# Patient Record
Sex: Male | Born: 1935 | Race: White | Hispanic: No | Marital: Married | Smoking: Former smoker
Health system: Southern US, Community
[De-identification: ages and names within clinical notes are randomized; demographics above are authoritative.]

## PROBLEM LIST (undated history)

## (undated) DIAGNOSIS — N184 Chronic kidney disease, stage 4 (severe): Secondary | ICD-10-CM

## (undated) DIAGNOSIS — R2681 Unsteadiness on feet: Secondary | ICD-10-CM

## (undated) DIAGNOSIS — N138 Other obstructive and reflux uropathy: Secondary | ICD-10-CM

## (undated) DIAGNOSIS — M19031 Primary osteoarthritis, right wrist: Secondary | ICD-10-CM

## (undated) DIAGNOSIS — I10 Essential (primary) hypertension: Secondary | ICD-10-CM

## (undated) DIAGNOSIS — N281 Cyst of kidney, acquired: Secondary | ICD-10-CM

## (undated) DIAGNOSIS — M545 Low back pain, unspecified: Secondary | ICD-10-CM

## (undated) DIAGNOSIS — G8929 Other chronic pain: Secondary | ICD-10-CM

## (undated) DIAGNOSIS — E119 Type 2 diabetes mellitus without complications: Secondary | ICD-10-CM

## (undated) DIAGNOSIS — Z862 Personal history of diseases of the blood and blood-forming organs and certain disorders involving the immune mechanism: Secondary | ICD-10-CM

## (undated) DIAGNOSIS — N319 Neuromuscular dysfunction of bladder, unspecified: Secondary | ICD-10-CM

## (undated) DIAGNOSIS — N200 Calculus of kidney: Secondary | ICD-10-CM

## (undated) DIAGNOSIS — Z8619 Personal history of other infectious and parasitic diseases: Secondary | ICD-10-CM

## (undated) DIAGNOSIS — N189 Chronic kidney disease, unspecified: Secondary | ICD-10-CM

## (undated) DIAGNOSIS — K5790 Diverticulosis of intestine, part unspecified, without perforation or abscess without bleeding: Secondary | ICD-10-CM

## (undated) DIAGNOSIS — Z87448 Personal history of other diseases of urinary system: Secondary | ICD-10-CM

## (undated) DIAGNOSIS — N401 Enlarged prostate with lower urinary tract symptoms: Secondary | ICD-10-CM

## (undated) DIAGNOSIS — N183 Chronic kidney disease, stage 3 (moderate): Secondary | ICD-10-CM

## (undated) DIAGNOSIS — N390 Urinary tract infection, site not specified: Secondary | ICD-10-CM

## (undated) DIAGNOSIS — I714 Abdominal aortic aneurysm, without rupture, unspecified: Secondary | ICD-10-CM

## (undated) DIAGNOSIS — R5381 Other malaise: Secondary | ICD-10-CM

## (undated) DIAGNOSIS — N3941 Urge incontinence: Secondary | ICD-10-CM

## (undated) DIAGNOSIS — J449 Chronic obstructive pulmonary disease, unspecified: Secondary | ICD-10-CM

## (undated) DIAGNOSIS — R972 Elevated prostate specific antigen [PSA]: Secondary | ICD-10-CM

## (undated) HISTORY — DX: Chronic kidney disease, stage 3 (moderate): N18.3

## (undated) HISTORY — DX: Low back pain: M54.5

## (undated) HISTORY — DX: Personal history of other infectious and parasitic diseases: Z86.19

## (undated) HISTORY — DX: Cyst of kidney, acquired: N28.1

## (undated) HISTORY — DX: Unsteadiness on feet: R26.81

## (undated) HISTORY — DX: Other obstructive and reflux uropathy: N13.8

## (undated) HISTORY — DX: Diverticulosis of intestine, part unspecified, without perforation or abscess without bleeding: K57.90

## (undated) HISTORY — DX: Chronic kidney disease, unspecified: N18.9

## (undated) HISTORY — DX: Personal history of diseases of the blood and blood-forming organs and certain disorders involving the immune mechanism: Z86.2

## (undated) HISTORY — DX: Chronic obstructive pulmonary disease, unspecified: J44.9

## (undated) HISTORY — DX: Benign prostatic hyperplasia with lower urinary tract symptoms: N40.1

## (undated) HISTORY — DX: Essential (primary) hypertension: I10

## (undated) HISTORY — DX: Calculus of kidney: N20.0

## (undated) HISTORY — DX: Abdominal aortic aneurysm, without rupture, unspecified: I71.40

## (undated) HISTORY — DX: Other malaise: R53.81

## (undated) HISTORY — DX: Elevated prostate specific antigen (PSA): R97.20

## (undated) HISTORY — DX: Personal history of other diseases of urinary system: Z87.448

## (undated) HISTORY — DX: Low back pain, unspecified: M54.50

## (undated) HISTORY — DX: Neuromuscular dysfunction of bladder, unspecified: N31.9

## (undated) HISTORY — DX: Type 2 diabetes mellitus without complications: E11.9

## (undated) HISTORY — DX: Primary osteoarthritis, right wrist: M19.031

## (undated) HISTORY — DX: Urge incontinence: N39.41

## (undated) HISTORY — DX: Other chronic pain: G89.29

## (undated) HISTORY — DX: Chronic kidney disease, stage 4 (severe): N18.4

## (undated) HISTORY — DX: Urinary tract infection, site not specified: N39.0

## (undated) HISTORY — PX: CATARACT EXTRACTION, BILATERAL: SHX1313

---
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
## 2008-06-30 HISTORY — PX: VENTRAL HERNIA REPAIR: SHX424

## 2014-05-09 DIAGNOSIS — Z72820 Sleep deprivation: Secondary | ICD-10-CM | POA: Diagnosis not present

## 2014-05-09 DIAGNOSIS — I10 Essential (primary) hypertension: Secondary | ICD-10-CM | POA: Insufficient documentation

## 2014-05-09 DIAGNOSIS — M545 Low back pain: Secondary | ICD-10-CM | POA: Diagnosis not present

## 2014-05-09 DIAGNOSIS — Z23 Encounter for immunization: Secondary | ICD-10-CM | POA: Diagnosis not present

## 2014-05-09 DIAGNOSIS — G8929 Other chronic pain: Secondary | ICD-10-CM | POA: Diagnosis not present

## 2014-05-09 DIAGNOSIS — R7309 Other abnormal glucose: Secondary | ICD-10-CM | POA: Diagnosis not present

## 2014-05-09 DIAGNOSIS — Z79899 Other long term (current) drug therapy: Secondary | ICD-10-CM | POA: Insufficient documentation

## 2014-05-09 DIAGNOSIS — R5383 Other fatigue: Secondary | ICD-10-CM | POA: Diagnosis not present

## 2014-05-19 DIAGNOSIS — M25531 Pain in right wrist: Secondary | ICD-10-CM | POA: Diagnosis not present

## 2014-05-19 DIAGNOSIS — S62001P Unspecified fracture of navicular [scaphoid] bone of right wrist, subsequent encounter for fracture with malunion: Secondary | ICD-10-CM | POA: Diagnosis not present

## 2014-06-06 DIAGNOSIS — R7989 Other specified abnormal findings of blood chemistry: Secondary | ICD-10-CM | POA: Diagnosis not present

## 2014-06-06 DIAGNOSIS — R739 Hyperglycemia, unspecified: Secondary | ICD-10-CM | POA: Diagnosis not present

## 2014-06-06 DIAGNOSIS — R809 Proteinuria, unspecified: Secondary | ICD-10-CM | POA: Diagnosis not present

## 2014-06-06 DIAGNOSIS — I10 Essential (primary) hypertension: Secondary | ICD-10-CM | POA: Diagnosis not present

## 2014-06-06 DIAGNOSIS — Z23 Encounter for immunization: Secondary | ICD-10-CM | POA: Diagnosis not present

## 2014-06-20 DIAGNOSIS — R0683 Snoring: Secondary | ICD-10-CM | POA: Diagnosis not present

## 2014-06-20 DIAGNOSIS — R5383 Other fatigue: Secondary | ICD-10-CM | POA: Diagnosis not present

## 2014-06-20 DIAGNOSIS — G4710 Hypersomnia, unspecified: Secondary | ICD-10-CM | POA: Diagnosis not present

## 2014-07-09 DIAGNOSIS — S62021A Displaced fracture of middle third of navicular [scaphoid] bone of right wrist, initial encounter for closed fracture: Secondary | ICD-10-CM | POA: Insufficient documentation

## 2014-08-10 DIAGNOSIS — R7989 Other specified abnormal findings of blood chemistry: Secondary | ICD-10-CM | POA: Diagnosis not present

## 2014-08-16 DIAGNOSIS — N184 Chronic kidney disease, stage 4 (severe): Secondary | ICD-10-CM | POA: Insufficient documentation

## 2014-08-16 DIAGNOSIS — R7301 Impaired fasting glucose: Secondary | ICD-10-CM | POA: Insufficient documentation

## 2014-08-16 DIAGNOSIS — N183 Chronic kidney disease, stage 3 (moderate): Secondary | ICD-10-CM | POA: Diagnosis not present

## 2014-08-16 DIAGNOSIS — I10 Essential (primary) hypertension: Secondary | ICD-10-CM | POA: Diagnosis not present

## 2014-11-10 DIAGNOSIS — I10 Essential (primary) hypertension: Secondary | ICD-10-CM | POA: Diagnosis not present

## 2014-11-10 DIAGNOSIS — Z9119 Patient's noncompliance with other medical treatment and regimen: Secondary | ICD-10-CM | POA: Diagnosis not present

## 2014-11-10 DIAGNOSIS — N183 Chronic kidney disease, stage 3 (moderate): Secondary | ICD-10-CM | POA: Diagnosis not present

## 2014-11-30 ENCOUNTER — Ambulatory Visit (INDEPENDENT_AMBULATORY_CARE_PROVIDER_SITE_OTHER): Payer: Medicare Other | Admitting: Family Medicine

## 2014-11-30 ENCOUNTER — Encounter: Payer: Self-pay | Admitting: Family Medicine

## 2014-11-30 VITALS — BP 163/97 | HR 64 | Temp 97.7°F | Resp 18 | Ht 66.5 in | Wt 196.0 lb

## 2014-11-30 DIAGNOSIS — R809 Proteinuria, unspecified: Secondary | ICD-10-CM | POA: Diagnosis not present

## 2014-11-30 DIAGNOSIS — R7309 Other abnormal glucose: Secondary | ICD-10-CM

## 2014-11-30 DIAGNOSIS — N183 Chronic kidney disease, stage 3 unspecified: Secondary | ICD-10-CM

## 2014-11-30 DIAGNOSIS — I10 Essential (primary) hypertension: Secondary | ICD-10-CM

## 2014-11-30 DIAGNOSIS — R7303 Prediabetes: Secondary | ICD-10-CM

## 2014-11-30 LAB — MICROALBUMIN / CREATININE URINE RATIO
Creatinine,U: 120.8 mg/dL
MICROALB/CREAT RATIO: 2.6 mg/g (ref 0.0–30.0)
Microalb, Ur: 3.1 mg/dL — ABNORMAL HIGH (ref 0.0–1.9)

## 2014-11-30 MED ORDER — METOPROLOL SUCCINATE ER 50 MG PO TB24: ORAL_TABLET | ORAL | Status: DC

## 2014-11-30 NOTE — Progress Notes (Signed)
Pre visit review using our clinic review tool, if applicable. No additional management support is needed unless otherwise documented below in the visit note. 

## 2014-11-30 NOTE — Progress Notes (Signed)
Office Note 12/10/2014  CC:  Chief Complaint  Patient presents with  . Establish Care    Pt is fasting.  Marland Kitchen Hypertension   HPI:  Gary Morgan is a 81 y.o. White male who is here to establish care, discuss HTN. Patient's most recent primary MD: Dr. Salvadore Dom on [address] in [place], [place].  Prior to this he saw PCP in [place] before relocating to [facility] 08/2013. Old records were not reviewed prior to or during today's visit.  He apparently was dismissed from the [facility] practice for "noncompliance".  Bad experience there.  Reviewed bp from the last 2 wks: avg syst 135, avg diast 85. Historically bp persistently > 140 over 90.  Never consistently stage 2.  He reports a bit of white coat syndrome as well. Most recent labs from 2 weeks ago reviewed today: Cr stable at 1.8, lytes normal. Back in Nov 2015 his FLP was wnl and hepatic panel was normal. He did have mild microalbuminuria in 04/2014.  Pt and wife note that he does walk in a somewhat stooped over manner such that sometimes his feel fall behind the upper body and he almost falls.  He denies dizziness, tremor, HA's, vomiting, or memory/cognitive problems.   This is not a new problem: possibly occurring last couple years at least, not progressive.   Past Medical History  Diagnosis Date  . Arthritis of right wrist     s/p fracture of middle third of scaphoid bone sustained cleaning up from [facility] injection by Dr. Napoleon Form in [place] (ortho) made the pain go away.  . Pre-diabetes   . Hypertension   . Kidney stones     one episode  . Chronic renal insufficiency, stage III (moderate)     CrCl 30s (saw nephrologist in [place]). +microalbuminuria 04/2014 at PCP in [place]    Past Surgical History  Procedure Laterality Date  . Ventral hernia repair  2010    with strangulation of SB (approx 1 foot of SB had to be excised).  Had 3 total surgeries for this due to complications  .  Tonsillectomy  1940    Family History  Problem Relation Age of Onset  . Breast cancer Mother   . Diabetes Mother     History   Social History  . Marital Status: Married    Spouse Name: N/A  . Number of Children: N/A  . Years of Education: N/A   Occupational History  . Not on file.   Social History Main Topics  . Smoking status: Former Smoker -- 0.25 packs/day for 5 years    Types: Cigarettes    Quit date: 06/30/1978  . Smokeless tobacco: Never Used  . Alcohol Use: No  . Drug Use: No  . Sexual Activity: Not on file   Other Topics Concern  . Not on file   Social History Narrative   Married, has 2 step daughters and 3 biologic children.  Has some grandchildren.   Relocated from [place] to [facility] 08/2013.   Retired [employer].   No T/A/Ds.    Outpatient Encounter Prescriptions as of 11/30/2014  Medication Sig  . aspirin 81 MG tablet Take 81 mg by mouth daily.  Marland Kitchen doxazosin (CARDURA) 4 MG tablet Take 4 mg by mouth at bedtime.  . metoprolol succinate (TOPROL-XL) 50 MG 24 hr tablet 1 tab po qd  . Multiple Vitamin (MULTIVITAMIN) tablet Take 1 tablet by mouth daily.  . valsartan-hydrochlorothiazide (DIOVAN-HCT) 320-25 MG per tablet Take 1 tablet by mouth daily.  . [  DISCONTINUED] metoprolol succinate (TOPROL-XL) 25 MG 24 hr tablet Take 25 mg by mouth daily.   No facility-administered encounter medications on file as of 11/30/2014.    Not on File   ROS Review of Systems  Constitutional: Negative for fever and fatigue.  HENT: Negative for congestion and sore throat.   Eyes: Negative for visual disturbance.  Respiratory: Negative for cough.   Cardiovascular: Negative for chest pain.  Gastrointestinal: Negative for nausea and abdominal pain.  Genitourinary: Negative for dysuria.  Musculoskeletal: Negative for back pain and joint swelling.  Skin: Negative for rash.  Neurological: Negative for weakness and headaches.  Hematological: Negative for adenopathy.     PE; Blood pressure 163/97, pulse 64, temperature 97.7 F (36.5 C), temperature source Oral, resp. rate 18, height 5' 6.5" (1.689 m), weight 196 lb (88.905 kg), SpO2 97 %. Gen: Alert, well appearing.  Patient is oriented to person, place, time, and situation. [id number]: no injection, icteris, swelling, or exudate.  EOMI, PERRLA. Mouth: lips without lesion/swelling.  Oral mucosa pink and moist. Oropharynx without erythema, exudate, or swelling.  Neck - No masses or thyromegaly or limitation in range of motion CV: RRR, no m/r/g.   LUNGS: CTA bilat, nonlabored resps, good aeration in all lung fields. EXT: no clubbing, cyanosis, or edema.  No cogwheel rigidity, no resting tremor, no intention tremor.  Facies are not mask-like. He walks with slight suggestion of shuffling gait and does a couple of pivots when turning 180 deg to return to where he started.  Pertinent labs:  none  ASSESSMENT AND PLAN:   1) HTN, not ideal control, particularly when pt has hypertensive renal dz: will shoot for goal of 120s over 70s. Increase toprol XL to 50mg  po qd.  2) CRI, stage III: recheck urine microalb/cr today. Working on tighter bp control, avoid nsaids and dehydration.  3) Mildly abnormal gait; w/out any other neuro or musculoskeletal abnormality. Watchful waiting approach at this time.  An After Visit Summary was printed and given to the patient.  Return in about 4 weeks (around 12/28/2014) for f/u HTN (30 min visit).

## 2014-12-28 ENCOUNTER — Other Ambulatory Visit: Payer: Self-pay | Admitting: *Deleted

## 2014-12-28 ENCOUNTER — Encounter: Payer: Self-pay | Admitting: Family Medicine

## 2014-12-28 ENCOUNTER — Ambulatory Visit (INDEPENDENT_AMBULATORY_CARE_PROVIDER_SITE_OTHER): Payer: Medicare Other | Admitting: Family Medicine

## 2014-12-28 VITALS — BP 120/72 | HR 51 | Temp 97.7°F | Resp 12 | Wt 196.0 lb

## 2014-12-28 DIAGNOSIS — R001 Bradycardia, unspecified: Secondary | ICD-10-CM

## 2014-12-28 DIAGNOSIS — I10 Essential (primary) hypertension: Secondary | ICD-10-CM | POA: Diagnosis not present

## 2014-12-28 DIAGNOSIS — D72829 Elevated white blood cell count, unspecified: Secondary | ICD-10-CM

## 2014-12-28 DIAGNOSIS — Z13220 Encounter for screening for lipoid disorders: Secondary | ICD-10-CM

## 2014-12-28 DIAGNOSIS — D751 Secondary polycythemia: Secondary | ICD-10-CM

## 2014-12-28 DIAGNOSIS — N183 Chronic kidney disease, stage 3 unspecified: Secondary | ICD-10-CM

## 2014-12-28 DIAGNOSIS — R7309 Other abnormal glucose: Secondary | ICD-10-CM | POA: Diagnosis not present

## 2014-12-28 DIAGNOSIS — R7303 Prediabetes: Secondary | ICD-10-CM

## 2014-12-28 DIAGNOSIS — Z0000 Encounter for general adult medical examination without abnormal findings: Secondary | ICD-10-CM

## 2014-12-28 LAB — CBC
HCT: 47 % (ref 39.0–52.0)
Hemoglobin: 15.9 g/dL (ref 13.0–17.0)
MCHC: 33.8 g/dL (ref 30.0–36.0)
MCV: 92.7 fl (ref 78.0–100.0)
PLATELETS: 168 10*3/uL (ref 150.0–400.0)
RBC: 5.07 Mil/uL (ref 4.22–5.81)
RDW: 13.5 % (ref 11.5–15.5)
WBC: 8.3 10*3/uL (ref 4.0–10.5)

## 2014-12-28 LAB — HEMOGLOBIN A1C: Hgb A1c MFr Bld: 6.2 % (ref 4.6–6.5)

## 2014-12-28 LAB — PHOSPHORUS: Phosphorus: 3.3 mg/dL (ref 2.3–4.6)

## 2014-12-28 NOTE — Progress Notes (Signed)
OFFICE NOTE  12/28/2014  CC:  Chief Complaint  Patient presents with  . Follow-up    HTN      HPI: Patient is a 81 y.o. Caucasian male who is here for 1 mo f/u HTN, CRI stage 3, pre-diabetes. No home gluc monitoring.  Unknown last A1c (relatively new pt to me). HOme bp and HR monitoring since last visit when I increased his toprol: 120s/70s, HR 50s for the most part, but unclear whether pt has also been having some HR in 30s-40s upon awakening in mornings.  Feels fatigued when first gets up in morning, then has cup of coffee and rechecks HR---feels good throughout rest of day and night.  He does not want to see a nephrologist locally as he had in the past where he used to live, wants to leave this decision up to me.  Pertinent PMH:  Past medical, surgical, social, and family history reviewed and no changes are noted since last office visit.  MEDS:  Outpatient Prescriptions Prior to Visit  Medication Sig Dispense Refill  . aspirin 81 MG tablet Take 81 mg by mouth daily.    Marland Kitchen doxazosin (CARDURA) 4 MG tablet Take 4 mg by mouth at bedtime.    . metoprolol succinate (TOPROL-XL) 50 MG 24 hr tablet 1 tab po qd (Patient taking differently: Take 50 mg by mouth daily. 1 tab po qd) 30 tablet 1  . Multiple Vitamin (MULTIVITAMIN) tablet Take 1 tablet by mouth daily.    . valsartan-hydrochlorothiazide (DIOVAN-HCT) 320-25 MG per tablet Take 1 tablet by mouth daily.     No facility-administered medications prior to visit.    PE: Blood pressure 120/72, pulse 51, temperature 97.7 F (36.5 C), temperature source Oral, resp. rate 12, weight 196 lb (88.905 kg), SpO2 96 %. Gen: Alert, well appearing.  Patient is oriented to person, place, time, and situation. CV: Regular rhythm, brady to mid 50s, no m/r/g.   LUNGS: CTA bilat, nonlabored resps, good aeration in all lung fields. EXT: no clubbing, cyanosis, or edema.  No tremor.  No cogwheel rigidity.  LABS: none  IMPRESSION AND PLAN:  1)  HTN;The current medical regimen is effective;  continue present plan and medications. Call if persistent low HR in mornings.  2) CRI, stage 3: most recent BUN/Cr 10/2014 was stable. Check CBC no diff + intact PTH with ca and phos today.  3) Prediabetes: check HbA1c today.  An After Visit Summary was printed and given to the patient.  FOLLOW UP: 4 mo, recheck CMET at that time.

## 2014-12-29 LAB — PTH, INTACT AND CALCIUM
Calcium: 9.8 mg/dL (ref 8.4–10.5)
PTH: 46 pg/mL (ref 14–64)

## 2015-01-31 ENCOUNTER — Telehealth: Payer: Self-pay | Admitting: Family Medicine

## 2015-01-31 MED ORDER — METOPROLOL SUCCINATE ER 50 MG PO TB24: ORAL_TABLET | ORAL | Status: DC

## 2015-01-31 NOTE — Telephone Encounter (Signed)
RF request for metoprolol LOV: 12/28/14 Next ov:  04/25/15 Last written: 11/30/14 #30 w/ 1RF  Pts wife LMOM earlier today 01/31/15 at 9:18am requesting refill, she has been advised that we have received message.

## 2015-01-31 NOTE — Telephone Encounter (Signed)
Pt. Is requesting a refill on his Metoprolol 50 mg. Pharmacy number is [phone]

## 2015-02-26 ENCOUNTER — Encounter: Payer: Self-pay | Admitting: Family Medicine

## 2015-03-20 ENCOUNTER — Telehealth: Payer: Self-pay | Admitting: Family Medicine

## 2015-03-20 MED ORDER — DOXAZOSIN MESYLATE 4 MG PO TABS: 4.0000 mg | ORAL_TABLET | Freq: Every day | ORAL | Status: DC

## 2015-03-20 MED ORDER — VALSARTAN-HYDROCHLOROTHIAZIDE 320-25 MG PO TABS: 1.0000 | ORAL_TABLET | Freq: Every day | ORAL | Status: DC

## 2015-03-20 NOTE — Telephone Encounter (Signed)
Rx request Goxazosin Mefylate 4 MG, Valsartan-Hydrochlorothiazid 320-25 [place]. Patient needs 2 months worth. Patient cannot hear very well, any questions call his wife.

## 2015-03-20 NOTE — Telephone Encounter (Signed)
Pts wife LMOM on 03/19/15 at 8:52am.  LOV: 12/28/14 NOV: 04/25/15  RF request for doxazosin Last written: unknown  RF request for valsartan/hctz Last written: unknown  Pt advised Rx has been sent.

## 2015-04-25 ENCOUNTER — Ambulatory Visit (INDEPENDENT_AMBULATORY_CARE_PROVIDER_SITE_OTHER): Payer: Medicare Other | Admitting: Family Medicine

## 2015-04-25 ENCOUNTER — Encounter: Payer: Self-pay | Admitting: Family Medicine

## 2015-04-25 VITALS — BP 138/84 | HR 59 | Temp 97.6°F | Resp 16 | Ht 66.5 in | Wt 196.0 lb

## 2015-04-25 DIAGNOSIS — Z23 Encounter for immunization: Secondary | ICD-10-CM | POA: Diagnosis not present

## 2015-04-25 DIAGNOSIS — I10 Essential (primary) hypertension: Secondary | ICD-10-CM | POA: Diagnosis not present

## 2015-04-25 LAB — BASIC METABOLIC PANEL
BUN: 26 mg/dL — AB (ref 6–23)
CALCIUM: 9.5 mg/dL (ref 8.4–10.5)
CHLORIDE: 103 meq/L (ref 96–112)
CO2: 27 meq/L (ref 19–32)
CREATININE: 2.16 mg/dL — AB (ref 0.40–1.50)
GFR: 31.33 mL/min — ABNORMAL LOW (ref >60.00)
Glucose, Bld: 90 mg/dL (ref 70–99)
Potassium: 4.6 mEq/L (ref 3.5–5.1)
Sodium: 141 mEq/L (ref 135–145)

## 2015-04-25 MED ORDER — VALSARTAN-HYDROCHLOROTHIAZIDE 320-25 MG PO TABS: 1.0000 | ORAL_TABLET | Freq: Every day | ORAL | Status: DC

## 2015-04-25 MED ORDER — METOPROLOL SUCCINATE ER 50 MG PO TB24: ORAL_TABLET | ORAL | Status: DC

## 2015-04-25 MED ORDER — DOXAZOSIN MESYLATE 4 MG PO TABS: 4.0000 mg | ORAL_TABLET | Freq: Every day | ORAL | Status: DC

## 2015-04-25 NOTE — Progress Notes (Signed)
Pre visit review using our clinic review tool, if applicable. No additional management support is needed unless otherwise documented below in the visit note. 

## 2015-04-25 NOTE — Progress Notes (Signed)
OFFICE NOTE  04/25/2015  CC:  Chief Complaint  Patient presents with  . Follow-up    Pt is not fasting.      HPI: Patient is a 81 y.o. Caucasian male who is here for 4 mo f/u HTN, CRI stage 3, prediabetes Feeling well.  Compliant with all chronic meds. No dizziness, fatigue, CP, SOB, or vision c/o. More active lately--cleaned out garage yesterday.  Home bp monitoring shows 120s/70s, HR avg 50-60s.  No orthostatic dizziness.  He is trying to eat a diabetic diet. No home glucose checks.  Pertinent PMH:  Past medical, surgical, social, and family history reviewed and no changes are noted since last office visit.  MEDS:  Outpatient Prescriptions Prior to Visit  Medication Sig Dispense Refill  . aspirin 81 MG tablet Take 81 mg by mouth daily.    Marland Kitchen doxazosin (CARDURA) 4 MG tablet Take 1 tablet (4 mg total) by mouth at bedtime. 30 tablet 2  . metoprolol succinate (TOPROL-XL) 50 MG 24 hr tablet 1 tab po qd 30 tablet 3  . Multiple Vitamin (MULTIVITAMIN) tablet Take 1 tablet by mouth daily.    . valsartan-hydrochlorothiazide (DIOVAN-HCT) 320-25 MG per tablet Take 1 tablet by mouth daily. 30 tablet 2   No facility-administered medications prior to visit.    PE: Blood pressure 138/84, pulse 59, temperature 97.6 F (36.4 C), temperature source Oral, resp. rate 16, height 5' 6.5" (1.689 m), weight 196 lb (88.905 kg), SpO2 95 %. Gen: Alert, well appearing.  Patient is oriented to person, place, time, and situation. AFFECT: pleasant, lucid thought and speech. No further exam today.  LABS:  Lab Results  Component Value Date   HGBA1C 6.2 12/28/2014   Lab Results  Component Value Date   WBC 8.3 12/28/2014   HGB 15.9 12/28/2014   HCT 47.0 12/28/2014   MCV 92.7 12/28/2014   PLT 168.0 12/28/2014   Lab Results  Component Value Date   PTH 46 12/28/2014   CALCIUM 9.8 12/28/2014   PHOS 3.3 12/28/2014     IMPRESSION AND PLAN:  1) HTN; The current medical regimen is effective;   continue present plan and medications. BMET today.  2) CRI, stage 3: The current medical regimen is effective;  continue present plan and medications. BMET today.  3) Prediabetes; continue dietary adjustments, be as active as possible. We'll recheck HbA1c at next f/u in 4 mo.  4) Prev health care: flu vaccine given today.  An After Visit Summary was printed and given to the patient.  FOLLOW UP: 4 mo

## 2015-07-01 DIAGNOSIS — N390 Urinary tract infection, site not specified: Secondary | ICD-10-CM

## 2015-07-01 HISTORY — DX: Urinary tract infection, site not specified: N39.0

## 2015-07-03 ENCOUNTER — Other Ambulatory Visit: Payer: Self-pay | Admitting: *Deleted

## 2015-07-03 ENCOUNTER — Telehealth: Payer: Self-pay | Admitting: *Deleted

## 2015-07-03 NOTE — Telephone Encounter (Signed)
Tylenol 500 mg tabs, 1-2 tabs every 6 hours as needed for pain. Avoid all other otc meds for pain.

## 2015-07-03 NOTE — Telephone Encounter (Signed)
Pts wife LMOM on 07/03/15 at 10:48am stating that pt has been having some shoulder pain and wants to know what he can take otc for this. Please advise. Thanks.

## 2015-07-04 NOTE — Telephone Encounter (Signed)
Pts wife advised and voiced understanding.  

## 2015-08-22 ENCOUNTER — Encounter: Payer: Self-pay | Admitting: Family Medicine

## 2015-08-22 ENCOUNTER — Ambulatory Visit (INDEPENDENT_AMBULATORY_CARE_PROVIDER_SITE_OTHER): Payer: Medicare Other | Admitting: Family Medicine

## 2015-08-22 VITALS — BP 182/118 | HR 65 | Temp 97.3°F | Resp 16 | Ht 66.5 in | Wt 196.5 lb

## 2015-08-22 DIAGNOSIS — R0609 Other forms of dyspnea: Secondary | ICD-10-CM | POA: Diagnosis not present

## 2015-08-22 DIAGNOSIS — R0600 Dyspnea, unspecified: Secondary | ICD-10-CM

## 2015-08-22 DIAGNOSIS — I10 Essential (primary) hypertension: Secondary | ICD-10-CM | POA: Diagnosis not present

## 2015-08-22 DIAGNOSIS — N183 Chronic kidney disease, stage 3 unspecified: Secondary | ICD-10-CM

## 2015-08-22 DIAGNOSIS — R7303 Prediabetes: Secondary | ICD-10-CM

## 2015-08-22 LAB — BASIC METABOLIC PANEL
BUN: 30 mg/dL — AB (ref 6–23)
CHLORIDE: 101 meq/L (ref 96–112)
CO2: 33 meq/L — AB (ref 19–32)
CREATININE: 2.42 mg/dL — AB (ref 0.40–1.50)
Calcium: 9.8 mg/dL (ref 8.4–10.5)
GFR: 27.46 mL/min — ABNORMAL LOW (ref >60.00)
GLUCOSE: 110 mg/dL — AB (ref 70–99)
POTASSIUM: 4.6 meq/L (ref 3.5–5.1)
Sodium: 142 mEq/L (ref 135–145)

## 2015-08-22 LAB — HEMOGLOBIN A1C: Hgb A1c MFr Bld: 6.4 % (ref 4.6–6.5)

## 2015-08-22 NOTE — Progress Notes (Signed)
Pre visit review using our clinic review tool, if applicable. No additional management support is needed unless otherwise documented below in the visit note. 

## 2015-08-22 NOTE — Progress Notes (Addendum)
OFFICE VISIT  08/22/2015   CC:  Chief Complaint  Patient presents with  . Follow-up    Pt is not fasting.    HPI:    Patient is a 82 y.o. Caucasian male who presents for 4 mo f/u HTN, prediabetes, CRI stage III. Pt and wife say pt's b/p always tends to rise when he drives, like this morning. Home bp monitoring qod shows bp 120s/70s avg, with HR 60 avg.  Says he feels good. No polyuria or polydipsia.  No LE edema.    He walks for activity, has a shed in his yard and a garden he works in when weather is warm. Walking from car to restaurant makes him feel SOB.  Rests a few minutes to get back to normal.   No CP.   Says it has been this way "for a long while now".  Attributes it to age.   Past Medical History  Diagnosis Date  . Arthritis of right wrist     s/p fracture of middle third of scaphoid bone sustained cleaning up from [facility] injection by Dr. Napoleon Form in [place] (ortho) made the pain go away.  . Pre-diabetes     2015 HbA1c 6.2-6.3 %  . Hypertension   . Kidney stones     one episode  . Chronic renal insufficiency, stage III (moderate)     CrCl 30s (saw nephrologist in [place]). +microalbuminuria 04/2014 at PCP in [place]  . Chronic low back pain     Past Surgical History  Procedure Laterality Date  . Ventral hernia repair  2010    with strangulation of SB (approx 1 foot of SB had to be excised).  Had 3 total surgeries for this due to complications  . Tonsillectomy  1940    Outpatient Prescriptions Prior to Visit  Medication Sig Dispense Refill  . aspirin 81 MG tablet Take 81 mg by mouth daily.    Marland Kitchen doxazosin (CARDURA) 4 MG tablet Take 1 tablet (4 mg total) by mouth at bedtime. 30 tablet 12  . metoprolol succinate (TOPROL-XL) 50 MG 24 hr tablet 1 tab po qd 30 tablet 12  . Multiple Vitamin (MULTIVITAMIN) tablet Take 1 tablet by mouth daily.    . valsartan-hydrochlorothiazide (DIOVAN-HCT) 320-25 MG tablet Take 1 tablet by mouth daily.  30 tablet 12   No facility-administered medications prior to visit.    No Known Allergies  ROS As per HPI  PE: Blood pressure 182/118, pulse 65, temperature 97.3 F (36.3 C), temperature source Oral, resp. rate 16, height 5' 6.5" (1.689 m), weight 196 lb 8 oz (89.132 kg), SpO2 96 %. Gen: Alert, well appearing.  Patient is oriented to person, place, time, and situation. CV: RRR, no m/r/g.   LUNGS: CTA bilat, nonlabored resps, good aeration in all lung fields. EXT: no clubbing, cyanosis, or edema.    LABS:    Chemistry      Component Value Date/Time   NA 141 04/25/2015 1049   K 4.6 04/25/2015 1049   CL 103 04/25/2015 1049   CO2 27 04/25/2015 1049   BUN 26* 04/25/2015 1049   CREATININE 2.16* 04/25/2015 1049      Component Value Date/Time   CALCIUM 9.5 04/25/2015 1049     Lab Results  Component Value Date   HGBA1C 6.2 12/28/2014   IMPRESSION AND PLAN:  1) HTN; well controlled per home measurements. The current medical regimen is effective;  continue present plan and medications. Lytes/cr today.  2) Prediabetes: HbA1c today.  3) Dyspnea on exertion: chronic.   Check CXR and echo--ordered today.  4) CRI stage III: check BMET today.  An After Visit Summary was printed and given to the patient.  FOLLOW UP: Return in about 4 months (around 12/20/2015) for routine chronic illness f/u.   ADDENDUM 08/29/15:  Pt sent message via MyChart stating that he changed his mind about the echocardiogram and he canceled it.  He says he wants to give himself a chance to be more active and get in better shape before looking into any possible problem with his heart.---PM  Signed:  Crissie Sickles, MD           08/29/2015

## 2015-08-29 ENCOUNTER — Encounter: Payer: Self-pay | Admitting: Family Medicine

## 2015-08-29 NOTE — Telephone Encounter (Signed)
FYI

## 2015-08-30 ENCOUNTER — Encounter: Payer: Self-pay | Admitting: *Deleted

## 2015-09-04 ENCOUNTER — Other Ambulatory Visit (HOSPITAL_COMMUNITY): Payer: Medicare Other

## 2015-09-13 ENCOUNTER — Telehealth: Payer: Self-pay | Admitting: Family Medicine

## 2015-09-13 MED ORDER — VALSARTAN-HYDROCHLOROTHIAZIDE 320-25 MG PO TABS: 1.0000 | ORAL_TABLET | Freq: Every day | ORAL | Status: DC

## 2015-09-13 NOTE — Telephone Encounter (Signed)
RF request for valsartan/hctz LOV: 08/22/15 Next ov: 12/20/15 Last written: 04/25/15 #30 w/ 12RF  Rx sent for #90 w/ 3RF.

## 2015-09-13 NOTE — Telephone Encounter (Signed)
Tried calling pt NA and unable to leave a message.

## 2015-09-13 NOTE — Telephone Encounter (Signed)
Valsartan 90 day supply [employer]

## 2015-09-13 NOTE — Telephone Encounter (Signed)
Pts wife advised and voiced understanding, okay per DPR. 

## 2015-09-27 ENCOUNTER — Encounter: Payer: Self-pay | Admitting: Family Medicine

## 2015-09-27 ENCOUNTER — Ambulatory Visit (INDEPENDENT_AMBULATORY_CARE_PROVIDER_SITE_OTHER): Payer: Medicare Other

## 2015-09-27 DIAGNOSIS — J449 Chronic obstructive pulmonary disease, unspecified: Secondary | ICD-10-CM

## 2015-09-27 DIAGNOSIS — R0600 Dyspnea, unspecified: Secondary | ICD-10-CM

## 2015-09-27 DIAGNOSIS — R0609 Other forms of dyspnea: Principal | ICD-10-CM

## 2015-12-20 ENCOUNTER — Ambulatory Visit (INDEPENDENT_AMBULATORY_CARE_PROVIDER_SITE_OTHER): Payer: Medicare Other | Admitting: Family Medicine

## 2015-12-20 ENCOUNTER — Encounter: Payer: Self-pay | Admitting: Family Medicine

## 2015-12-20 VITALS — BP 142/98 | HR 47 | Temp 97.3°F | Resp 16 | Ht 66.5 in | Wt 193.8 lb

## 2015-12-20 DIAGNOSIS — R7303 Prediabetes: Secondary | ICD-10-CM

## 2015-12-20 DIAGNOSIS — N183 Chronic kidney disease, stage 3 unspecified: Secondary | ICD-10-CM

## 2015-12-20 DIAGNOSIS — I10 Essential (primary) hypertension: Secondary | ICD-10-CM | POA: Diagnosis not present

## 2015-12-20 LAB — COMPREHENSIVE METABOLIC PANEL
ALBUMIN: 4.3 g/dL (ref 3.5–5.2)
ALK PHOS: 68 U/L (ref 39–117)
ALT: 9 U/L (ref 0–53)
AST: 11 U/L (ref 0–37)
BUN: 29 mg/dL — AB (ref 6–23)
CALCIUM: 10 mg/dL (ref 8.4–10.5)
CO2: 36 mEq/L — ABNORMAL HIGH (ref 19–32)
CREATININE: 2.34 mg/dL — AB (ref 0.40–1.50)
Chloride: 100 mEq/L (ref 96–112)
GFR: 28.52 mL/min — ABNORMAL LOW (ref >60.00)
Glucose, Bld: 112 mg/dL — ABNORMAL HIGH (ref 70–99)
POTASSIUM: 4.5 meq/L (ref 3.5–5.1)
SODIUM: 140 meq/L (ref 135–145)
TOTAL PROTEIN: 6.8 g/dL (ref 6.0–8.3)
Total Bilirubin: 0.9 mg/dL (ref 0.2–1.2)

## 2015-12-20 LAB — HEMOGLOBIN A1C: Hgb A1c MFr Bld: 6.3 % (ref 4.6–6.5)

## 2015-12-20 NOTE — Progress Notes (Signed)
OFFICE VISIT  12/20/2015   CC:  Chief Complaint  Patient presents with  . Follow-up    Pt is not fasting.     HPI:    Patient is a 82 y.o. Caucasian male who presents accompanied by his wife for 4 mo f/u HTN, prediabetes, CRI stage III.  Feeling well, no acute complaints.  Has been drinking glucerna in place of breakfast. He has a garden that he does some work in, is walking more since weather warmer.  He weed-eats his property.  No CP or SOB.  Home bp monitoring: avg HR 60s, avg syst 130, avg diast 80.  Past Medical History  Diagnosis Date  . Arthritis of right wrist     s/p fracture of middle third of scaphoid bone sustained cleaning up from [facility] injection by Dr. Napoleon Form in [place] (ortho) made the pain go away.  . Pre-diabetes     2015 HbA1c 6.2-6.3 %  . Hypertension   . Kidney stones     one episode  . Chronic renal insufficiency, stage III (moderate)     CrCl 30s (saw nephrologist in [place]). +microalbuminuria 04/2014 at PCP in [place]  . Chronic low back pain   . COPD (chronic obstructive pulmonary disease) ([place])     on CXR 09/27/2015    Past Surgical History  Procedure Laterality Date  . Ventral hernia repair  2010    with strangulation of SB (approx 1 foot of SB had to be excised).  Had 3 total surgeries for this due to complications  . Tonsillectomy  1940    Outpatient Prescriptions Prior to Visit  Medication Sig Dispense Refill  . aspirin 81 MG tablet Take 81 mg by mouth daily.    Marland Kitchen doxazosin (CARDURA) 4 MG tablet Take 1 tablet (4 mg total) by mouth at bedtime. 30 tablet 12  . metoprolol succinate (TOPROL-XL) 50 MG 24 hr tablet 1 tab po qd 30 tablet 12  . Multiple Vitamin (MULTIVITAMIN) tablet Take 1 tablet by mouth daily.    . valsartan-hydrochlorothiazide (DIOVAN-HCT) 320-25 MG tablet Take 1 tablet by mouth daily. 90 tablet 3   No facility-administered medications prior to visit.    No Known Allergies  ROS As per  HPI  PE: Blood pressure 146/82, pulse 47, temperature 97.3 F (36.3 C), temperature source Oral, resp. rate 16, height 5' 6.5" (1.689 m), weight 193 lb 12 oz (87.884 kg), SpO2 96 %. Gen: Alert, well appearing.  Patient is oriented to person, place, time, and situation. CV: RRR with occ pause, rate 50-55.  No m/r/g Chest is clear, no wheezing or rales. Normal symmetric air entry throughout both lung fields. No chest wall deformities or tenderness. EXT: no clubbing, cyanosis, or edema.   LABS:   Lab Results  Component Value Date   WBC 8.3 12/28/2014   HGB 15.9 12/28/2014   HCT 47.0 12/28/2014   MCV 92.7 12/28/2014   PLT 168.0 12/28/2014   Lab Results  Component Value Date   CREATININE 2.42* 08/22/2015   BUN 30* 08/22/2015   NA 142 08/22/2015   K 4.6 08/22/2015   CL 101 08/22/2015   CO2 33* 08/22/2015   Lab Results  Component Value Date   HGBA1C 6.4 08/22/2015   IMPRESSION AND PLAN:  1) HTN: The current medical regimen is effective;  continue present plan and medications.  2) CRI stage III: BMET today.  3) Prediabetes: check A1c today.  If 6.5% or greater, will refer to nutritionist +/- start actos  15 mg qd.  An After Visit Summary was printed and given to the patient.  FOLLOW UP: Return in about 6 months (around 06/20/2016) for routine chronic illness f/u (30 min).  Signed:  Crissie Sickles, MD           12/20/2015

## 2015-12-20 NOTE — Progress Notes (Signed)
Pre visit review using our clinic review tool, if applicable. No additional management support is needed unless otherwise documented below in the visit note. 

## 2015-12-21 ENCOUNTER — Encounter: Payer: Self-pay | Admitting: Family Medicine

## 2015-12-21 NOTE — Telephone Encounter (Signed)
See MyChart message. Please advise. Thanks.  

## 2016-03-15 DIAGNOSIS — Z23 Encounter for immunization: Secondary | ICD-10-CM | POA: Diagnosis not present

## 2016-03-25 DIAGNOSIS — Z23 Encounter for immunization: Secondary | ICD-10-CM | POA: Diagnosis not present

## 2016-04-27 ENCOUNTER — Other Ambulatory Visit: Payer: Self-pay | Admitting: Family Medicine

## 2016-04-30 DIAGNOSIS — K5790 Diverticulosis of intestine, part unspecified, without perforation or abscess without bleeding: Secondary | ICD-10-CM

## 2016-04-30 DIAGNOSIS — Z8619 Personal history of other infectious and parasitic diseases: Secondary | ICD-10-CM

## 2016-04-30 DIAGNOSIS — Z87448 Personal history of other diseases of urinary system: Secondary | ICD-10-CM

## 2016-04-30 HISTORY — DX: Diverticulosis of intestine, part unspecified, without perforation or abscess without bleeding: K57.90

## 2016-04-30 HISTORY — DX: Personal history of other diseases of urinary system: Z87.448

## 2016-04-30 HISTORY — DX: Personal history of other infectious and parasitic diseases: Z86.19

## 2016-05-03 DIAGNOSIS — R509 Fever, unspecified: Secondary | ICD-10-CM | POA: Diagnosis not present

## 2016-05-03 DIAGNOSIS — I700 Atherosclerosis of aorta: Secondary | ICD-10-CM | POA: Diagnosis not present

## 2016-05-03 DIAGNOSIS — I10 Essential (primary) hypertension: Secondary | ICD-10-CM | POA: Diagnosis not present

## 2016-05-03 DIAGNOSIS — Z7982 Long term (current) use of aspirin: Secondary | ICD-10-CM | POA: Diagnosis not present

## 2016-05-03 DIAGNOSIS — R1010 Upper abdominal pain, unspecified: Secondary | ICD-10-CM | POA: Diagnosis not present

## 2016-05-03 DIAGNOSIS — N281 Cyst of kidney, acquired: Secondary | ICD-10-CM | POA: Diagnosis not present

## 2016-05-03 DIAGNOSIS — R4182 Altered mental status, unspecified: Secondary | ICD-10-CM | POA: Diagnosis not present

## 2016-05-03 DIAGNOSIS — A419 Sepsis, unspecified organism: Secondary | ICD-10-CM | POA: Diagnosis not present

## 2016-05-03 DIAGNOSIS — R7881 Bacteremia: Secondary | ICD-10-CM | POA: Diagnosis not present

## 2016-05-03 DIAGNOSIS — R9341 Abnormal radiologic findings on diagnostic imaging of renal pelvis, ureter, or bladder: Secondary | ICD-10-CM | POA: Diagnosis not present

## 2016-05-03 DIAGNOSIS — N183 Chronic kidney disease, stage 3 (moderate): Secondary | ICD-10-CM | POA: Diagnosis not present

## 2016-05-03 DIAGNOSIS — I129 Hypertensive chronic kidney disease with stage 1 through stage 4 chronic kidney disease, or unspecified chronic kidney disease: Secondary | ICD-10-CM | POA: Diagnosis not present

## 2016-05-03 DIAGNOSIS — R112 Nausea with vomiting, unspecified: Secondary | ICD-10-CM | POA: Diagnosis not present

## 2016-05-03 DIAGNOSIS — N390 Urinary tract infection, site not specified: Secondary | ICD-10-CM | POA: Diagnosis present

## 2016-05-03 DIAGNOSIS — L89899 Pressure ulcer of other site, unspecified stage: Secondary | ICD-10-CM | POA: Insufficient documentation

## 2016-05-03 DIAGNOSIS — Z79899 Other long term (current) drug therapy: Secondary | ICD-10-CM | POA: Diagnosis not present

## 2016-05-03 DIAGNOSIS — W19XXXA Unspecified fall, initial encounter: Secondary | ICD-10-CM | POA: Diagnosis not present

## 2016-05-03 DIAGNOSIS — Z87891 Personal history of nicotine dependence: Secondary | ICD-10-CM | POA: Diagnosis not present

## 2016-05-03 DIAGNOSIS — R531 Weakness: Secondary | ICD-10-CM | POA: Diagnosis not present

## 2016-05-03 DIAGNOSIS — E1122 Type 2 diabetes mellitus with diabetic chronic kidney disease: Secondary | ICD-10-CM | POA: Diagnosis not present

## 2016-05-03 DIAGNOSIS — N179 Acute kidney failure, unspecified: Secondary | ICD-10-CM | POA: Diagnosis not present

## 2016-05-03 DIAGNOSIS — R7301 Impaired fasting glucose: Secondary | ICD-10-CM | POA: Diagnosis not present

## 2016-05-03 DIAGNOSIS — B9620 Unspecified Escherichia coli [E. coli] as the cause of diseases classified elsewhere: Secondary | ICD-10-CM | POA: Diagnosis present

## 2016-05-05 ENCOUNTER — Telehealth: Payer: Self-pay | Admitting: Family Medicine

## 2016-05-05 NOTE — Telephone Encounter (Signed)
[facility] - Client [place] Patient Name: ILAY CAPSHAW Gender: Male DOB: [birth year] Age: 82 Y 1 M 22 D Return Phone Number: [phone] (Primary) Address: City/State/Zip: [place] [address] Client [facility] - Client Client Site [place] [facility] Physician Crissie Sickles - MD Contact Type Call Who Is Calling Patient / Member / Family / Caregiver Call Type Triage / Clinical Caller Name [name] Relationship To Patient Spouse Return Phone Number [phone] (Primary) Chief Complaint FAINTING or [place] Reason for Call Symptomatic / Request for Health Information Initial Comment caller is trying to find the nearest hospital. Caller's husbands legs collapsed his legs last night and vomited last night. It's hard for him to get out of the bed and can't drink anything. Translation No Nurse Assessment Nurse: Leilani Merl, RN, Heather Date/Time (Eastern Time): 05/03/2016 9:49:58 AM Confirm and document reason for call. If symptomatic, describe symptoms. You must click the next button to save text entered. ---caller is trying to find the nearest hospital. Caller's husbands legs collapsed his legs last night and vomited last night. It's hard for him to get out of the bed and can't drink anything. Has the patient traveled out of the country within the last 30 days? ---Not Applicable Does the patient have any new or worsening symptoms? ---Yes Will a triage be completed? ---Yes Related visit to physician within the last 2 weeks? ---No Does the PT have any chronic conditions? (i.e. diabetes, asthma, etc.) ---Unknown Is this a behavioral health or substance abuse call? ---No Guidelines Guideline Title Affirmed Question Affirmed Notes Nurse Date/Time (Eastern Time) Disp. Time Eilene Ghazi Time) Disposition Final User 05/03/2016 9:47:13 AM Send to Urgent Queue  Verner Chol 05/03/2016 9:55:20 AM Clinical Call Yes Standifer, RN, Heather PLEASE NOTE: All timestamps contained within this report are represented as [place] Standard Time. CONFIDENTIALTY NOTICE: This fax transmission is intended only for the addressee. It contains information that is legally privileged, confidential or otherwise protected from use or disclosure. If you are not the intended recipient, you are strictly prohibited from reviewing, disclosing, copying using or disseminating any of this information or taking any action in reliance on or regarding this information. If you have received this fax in error, please notify us immediately by telephone so that we can arrange for its return to [place]. Phone: [phone], Toll-Free: [phone], Fax: [phone] Page: 2 of 2 Call Id: [id number] Comments User: Ave Filter, RN Date/Time Eilene Ghazi Time): 05/03/2016 9:55:00 AM Call was disconnected and triager tried to call caller back and got voicemail, left message to call nurse back if needed. Caller states that she is going to call the ambulance to take her husband to the ED if he will let her.

## 2016-05-05 NOTE — Telephone Encounter (Signed)
LMOM for pt to CB to discuss how he is feeling now and if he went to ED and where.  Awaiting pt call.

## 2016-05-05 NOTE — Telephone Encounter (Signed)
Noted  

## 2016-05-06 ENCOUNTER — Telehealth: Payer: Self-pay | Admitting: Family Medicine

## 2016-05-06 NOTE — Telephone Encounter (Signed)
Noted  

## 2016-05-06 NOTE — Telephone Encounter (Signed)
Transition Care Management Follow-up Telephone Call ** Patient was seen at [facility].  We faxed record release for patient 05/06/16. **   Date discharged? 05/06/16   How have you been since you were released from the hospital? Patient just feels tired   Do you understand why you were in the hospital? yes   Do you understand the discharge instructions? yes   Where were you discharged to? Home   Items Reviewed:  Medications reviewed: yes, new Rx Cipro  Allergies reviewed: yes  Dietary changes reviewed: no  Referrals reviewed: no   Functional Questionnaire:   Activities of Daily Living (ADLs):   He states they are independent in the following: ambulation ( pt got Walker ) States they require assistance with the following: none   Any transportation issues/concerns?: no   Any patient concerns? No   Confirmed importance and date/time of follow-up visits scheduled yes  Provider Appointment booked with Dr Anitra Lauth 05/14/16 @ 1:30pm.   Confirmed with patient if condition begins to worsen call PCP or go to the ER.  Patient was given the office number and encouraged to call back with question or concerns.  : yes

## 2016-05-07 ENCOUNTER — Telehealth: Payer: Self-pay | Admitting: Family Medicine

## 2016-05-07 ENCOUNTER — Telehealth: Payer: Self-pay

## 2016-05-07 NOTE — Telephone Encounter (Signed)
Patient & wife notified and verbalized understanding.  ° °

## 2016-05-07 NOTE — Telephone Encounter (Signed)
See note attached to similar question from today.

## 2016-05-07 NOTE — Telephone Encounter (Signed)
Patient unable to move bowels. What can he take? Please call

## 2016-05-07 NOTE — Telephone Encounter (Signed)
Patients wife calling stating that Gary Morgan is having severe urethral burning all the time and worse when urinating. Catheter was removed removed yesterday morning.  Patient home form hospital yesterday.

## 2016-05-07 NOTE — Telephone Encounter (Signed)
Generic otc senakot S: 2 tabs every night. AND otc generic miralax powder, 1 capful twice daily until he has 2 good evacuations of his colon. He can then cut the miralax dosing back to 1 capful once a day.

## 2016-05-07 NOTE — Telephone Encounter (Signed)
Did he get sent home on any new medication (such as antibiotic)? What was the primary diagnosis when he was in the hospital? Did a home health nurse take out his catheter, or was his catheter taken out while he was still in hospital? Let me know

## 2016-05-07 NOTE — Telephone Encounter (Signed)
Diane S Tomerlin at 05/07/2016 9:48 AM   Status: Signed    Patient unable to move bowels. What can he take? Please call     Sorry, this was tacked onto the bottom of my TCM note.  Can you please advise.

## 2016-05-08 NOTE — Telephone Encounter (Signed)
I just saw his hospital d/c summary on my desk, so disregard the questions I wanted you to ask them. Reassure pt/caregiver that he has urethral irritation from having the catheter in, and this will slowly resolve over the next few days.  He should already be taking antibiotic from when he was in the hospital. I am hesitant to prescribe him any strong pain med b/c of potential side effects of this medication. Encourage pt to drink plenty of clear liquids and this will help the problem resolve quicker.-thx

## 2016-05-08 NOTE — Telephone Encounter (Signed)
Patient & wife notified and verbalized understanding.  ° °

## 2016-05-09 ENCOUNTER — Telehealth: Payer: Self-pay

## 2016-05-09 DIAGNOSIS — R188 Other ascites: Secondary | ICD-10-CM | POA: Diagnosis not present

## 2016-05-09 DIAGNOSIS — N10 Acute tubulo-interstitial nephritis: Secondary | ICD-10-CM | POA: Diagnosis not present

## 2016-05-09 DIAGNOSIS — Z79899 Other long term (current) drug therapy: Secondary | ICD-10-CM | POA: Diagnosis not present

## 2016-05-09 DIAGNOSIS — N138 Other obstructive and reflux uropathy: Secondary | ICD-10-CM | POA: Insufficient documentation

## 2016-05-09 DIAGNOSIS — N184 Chronic kidney disease, stage 4 (severe): Secondary | ICD-10-CM | POA: Diagnosis not present

## 2016-05-09 DIAGNOSIS — R339 Retention of urine, unspecified: Secondary | ICD-10-CM | POA: Diagnosis not present

## 2016-05-09 DIAGNOSIS — E8809 Other disorders of plasma-protein metabolism, not elsewhere classified: Secondary | ICD-10-CM | POA: Insufficient documentation

## 2016-05-09 DIAGNOSIS — R7881 Bacteremia: Secondary | ICD-10-CM | POA: Diagnosis not present

## 2016-05-09 DIAGNOSIS — R338 Other retention of urine: Secondary | ICD-10-CM | POA: Diagnosis not present

## 2016-05-09 DIAGNOSIS — N179 Acute kidney failure, unspecified: Secondary | ICD-10-CM | POA: Diagnosis not present

## 2016-05-09 DIAGNOSIS — E46 Unspecified protein-calorie malnutrition: Secondary | ICD-10-CM | POA: Diagnosis not present

## 2016-05-09 DIAGNOSIS — I10 Essential (primary) hypertension: Secondary | ICD-10-CM | POA: Diagnosis not present

## 2016-05-09 DIAGNOSIS — Z7982 Long term (current) use of aspirin: Secondary | ICD-10-CM | POA: Diagnosis not present

## 2016-05-09 DIAGNOSIS — N189 Chronic kidney disease, unspecified: Secondary | ICD-10-CM | POA: Diagnosis not present

## 2016-05-09 DIAGNOSIS — D631 Anemia in chronic kidney disease: Secondary | ICD-10-CM | POA: Insufficient documentation

## 2016-05-09 DIAGNOSIS — R0602 Shortness of breath: Secondary | ICD-10-CM | POA: Diagnosis not present

## 2016-05-09 DIAGNOSIS — N400 Enlarged prostate without lower urinary tract symptoms: Secondary | ICD-10-CM | POA: Diagnosis present

## 2016-05-09 DIAGNOSIS — I129 Hypertensive chronic kidney disease with stage 1 through stage 4 chronic kidney disease, or unspecified chronic kidney disease: Secondary | ICD-10-CM | POA: Diagnosis present

## 2016-05-09 DIAGNOSIS — N281 Cyst of kidney, acquired: Secondary | ICD-10-CM | POA: Diagnosis not present

## 2016-05-09 DIAGNOSIS — Z87891 Personal history of nicotine dependence: Secondary | ICD-10-CM | POA: Diagnosis not present

## 2016-05-09 DIAGNOSIS — E1122 Type 2 diabetes mellitus with diabetic chronic kidney disease: Secondary | ICD-10-CM | POA: Diagnosis not present

## 2016-05-09 DIAGNOSIS — N401 Enlarged prostate with lower urinary tract symptoms: Secondary | ICD-10-CM | POA: Diagnosis not present

## 2016-05-09 DIAGNOSIS — K5730 Diverticulosis of large intestine without perforation or abscess without bleeding: Secondary | ICD-10-CM | POA: Diagnosis not present

## 2016-05-09 DIAGNOSIS — Z6831 Body mass index (BMI) 31.0-31.9, adult: Secondary | ICD-10-CM | POA: Diagnosis not present

## 2016-05-09 DIAGNOSIS — N390 Urinary tract infection, site not specified: Secondary | ICD-10-CM | POA: Diagnosis not present

## 2016-05-09 DIAGNOSIS — N170 Acute kidney failure with tubular necrosis: Secondary | ICD-10-CM | POA: Diagnosis not present

## 2016-05-09 NOTE — Telephone Encounter (Signed)
Patient's wife called stating that patient is not urinating but a couple of drops which were bloody.  Dr. Anitra Lauth notified and instructed patient to go to [facility].  Patient's wife notified and verbalized understanding.

## 2016-05-11 ENCOUNTER — Encounter: Payer: Self-pay | Admitting: Family Medicine

## 2016-05-13 DIAGNOSIS — Z7982 Long term (current) use of aspirin: Secondary | ICD-10-CM | POA: Diagnosis not present

## 2016-05-13 DIAGNOSIS — Z9181 History of falling: Secondary | ICD-10-CM | POA: Diagnosis not present

## 2016-05-13 DIAGNOSIS — N390 Urinary tract infection, site not specified: Secondary | ICD-10-CM | POA: Diagnosis not present

## 2016-05-13 DIAGNOSIS — N401 Enlarged prostate with lower urinary tract symptoms: Secondary | ICD-10-CM | POA: Diagnosis not present

## 2016-05-13 DIAGNOSIS — E1122 Type 2 diabetes mellitus with diabetic chronic kidney disease: Secondary | ICD-10-CM | POA: Diagnosis not present

## 2016-05-13 DIAGNOSIS — R339 Retention of urine, unspecified: Secondary | ICD-10-CM | POA: Diagnosis not present

## 2016-05-13 DIAGNOSIS — N184 Chronic kidney disease, stage 4 (severe): Secondary | ICD-10-CM | POA: Diagnosis not present

## 2016-05-13 DIAGNOSIS — Z466 Encounter for fitting and adjustment of urinary device: Secondary | ICD-10-CM | POA: Diagnosis not present

## 2016-05-13 DIAGNOSIS — I129 Hypertensive chronic kidney disease with stage 1 through stage 4 chronic kidney disease, or unspecified chronic kidney disease: Secondary | ICD-10-CM | POA: Diagnosis not present

## 2016-05-14 ENCOUNTER — Telehealth: Payer: Self-pay

## 2016-05-14 ENCOUNTER — Ambulatory Visit (INDEPENDENT_AMBULATORY_CARE_PROVIDER_SITE_OTHER): Payer: Medicare Other | Admitting: Family Medicine

## 2016-05-14 ENCOUNTER — Encounter: Payer: Self-pay | Admitting: Family Medicine

## 2016-05-14 VITALS — BP 131/80 | HR 81 | Temp 97.7°F | Resp 18

## 2016-05-14 DIAGNOSIS — N184 Chronic kidney disease, stage 4 (severe): Secondary | ICD-10-CM | POA: Diagnosis not present

## 2016-05-14 DIAGNOSIS — N138 Other obstructive and reflux uropathy: Secondary | ICD-10-CM

## 2016-05-14 DIAGNOSIS — N189 Chronic kidney disease, unspecified: Secondary | ICD-10-CM

## 2016-05-14 DIAGNOSIS — D631 Anemia in chronic kidney disease: Secondary | ICD-10-CM

## 2016-05-14 DIAGNOSIS — N401 Enlarged prostate with lower urinary tract symptoms: Secondary | ICD-10-CM

## 2016-05-14 DIAGNOSIS — I10 Essential (primary) hypertension: Secondary | ICD-10-CM | POA: Diagnosis not present

## 2016-05-14 DIAGNOSIS — A4151 Sepsis due to Escherichia coli [E. coli]: Secondary | ICD-10-CM | POA: Diagnosis not present

## 2016-05-14 DIAGNOSIS — R338 Other retention of urine: Secondary | ICD-10-CM

## 2016-05-14 LAB — CBC WITH DIFFERENTIAL/PLATELET
BASOS PCT: 0.1 % (ref 0.0–3.0)
Basophils Absolute: 0 10*3/uL (ref 0.0–0.1)
EOS PCT: 2.9 % (ref 0.0–5.0)
Eosinophils Absolute: 0.3 10*3/uL (ref 0.0–0.7)
HCT: 36.4 % — ABNORMAL LOW (ref 39.0–52.0)
Hemoglobin: 12.1 g/dL — ABNORMAL LOW (ref 13.0–17.0)
LYMPHS ABS: 1.3 10*3/uL (ref 0.7–4.0)
Lymphocytes Relative: 11.4 % — ABNORMAL LOW (ref 12.0–46.0)
MCHC: 33.3 g/dL (ref 30.0–36.0)
MCV: 91.7 fl (ref 78.0–100.0)
MONOS PCT: 3.6 % (ref 3.0–12.0)
Monocytes Absolute: 0.4 10*3/uL (ref 0.1–1.0)
NEUTROS ABS: 9.6 10*3/uL — AB (ref 1.4–7.7)
NEUTROS PCT: 82 % — AB (ref 43.0–77.0)
PLATELETS: 375 10*3/uL (ref 150.0–400.0)
RBC: 3.97 Mil/uL — ABNORMAL LOW (ref 4.22–5.81)
RDW: 13.9 % (ref 11.5–15.5)
WBC: 11.7 10*3/uL — ABNORMAL HIGH (ref 4.0–10.5)

## 2016-05-14 LAB — BASIC METABOLIC PANEL
BUN: 33 mg/dL — ABNORMAL HIGH (ref 6–23)
CALCIUM: 8.7 mg/dL (ref 8.4–10.5)
CO2: 30 mEq/L (ref 19–32)
CREATININE: 2.43 mg/dL — AB (ref 0.40–1.50)
Chloride: 100 mEq/L (ref 96–112)
GFR: 27.28 mL/min — ABNORMAL LOW (ref >60.00)
Glucose, Bld: 153 mg/dL — ABNORMAL HIGH (ref 70–99)
Potassium: 4.9 mEq/L (ref 3.5–5.1)
Sodium: 139 mEq/L (ref 135–145)

## 2016-05-14 NOTE — Progress Notes (Signed)
Pre visit review using our clinic review tool, if applicable. No additional management support is needed unless otherwise documented below in the visit note. 

## 2016-05-14 NOTE — Progress Notes (Signed)
05/14/2016  CC:  Chief Complaint  Patient presents with  . Hospitalization Follow-up    Patient is a 82 y.o.  male who presents for  hospital follow up, specifically [place] face-to-face visit. Dates hospitalized: 11/4-11/7 AND 11/10-11/12, 2017. Days since d/c from hospital: 3 Patient was discharged from hospital to home. Reason for admission to hospital: First time; e coli urosepsis.  Second time; acute urinary retention, ARF on CRF, Pyelo. Date of interactive (phone) contact with patient and/or caregiver:05/05/16  I have reviewed patient's discharge summary plus pertinent specific notes, labs, and imaging from the hospitalization.  Pt was d/c'd home to finish 10 d of cipro after first hospitalization.  However, after going home he had inability to urinate that progressed to the point of total urinary retention and he had to present back to [facility] ED and he got re-admitted for this.  Foley was placed and 1.5L urine came out.  CT showed pyelo without hydronephrosis.  His Cr came down from admission Cr of 7.9 to d/c day Cr of 2.5.  His diovan HCT was held in hosp and at d/c until we determine his renal function to be stabilized.  Proscar and flomax were added on to help with BPH/urinary retention.  He was told how to make urologist f/u appt at the time of d/c. Has appt with urologist tomorrow.  Urine is flowing good per pt and daughter.  Says he is eating and drinking fine. Says energy level still down.  Home bp's 128-146/84-108.  HR 60s-80s.  He c/o some R hip pain with walking lately, says they did an x-ray of it in hospital recently and all was fine.  Catheter still in, some mildly bloody urine is in bag. He has had some coughing while eating so a speech therapist will do eval as outpt.    Medication reconciliation was done today and patient is taking meds as recommended by discharging hospitalist/specialist.    PMH:  Past Medical History:  Diagnosis Date   . Arthritis of right wrist    s/p fracture of middle third of scaphoid bone sustained cleaning up from [facility] injection by Dr. Napoleon Form in [place] (ortho) made the pain go away.  . Chronic low back pain   . Chronic renal insufficiency, stage III (moderate)    CrCl 30s (saw nephrologist in [place]). +microalbuminuria 04/2014 at PCP in [place]  . COPD (chronic obstructive pulmonary disease) ([place])    on CXR 09/27/2015  . Hypertension   . Kidney stones    one episode  . Pre-diabetes    2015 HbA1c 6.2-6.3 %    PSH:  Past Surgical History:  Procedure Laterality Date  . TONSILLECTOMY  1940  . VENTRAL HERNIA REPAIR  2010   with strangulation of SB (approx 1 foot of SB had to be excised).  Had 3 total surgeries for this due to complications    MEDS:  Outpatient Medications Prior to Visit  Medication Sig Dispense Refill  . aspirin 81 MG tablet Take 81 mg by mouth daily.    Marland Kitchen doxazosin (CARDURA) 4 MG tablet TAKE ONE TABLET BY MOUTH AT BEDTIME 90 tablet 0  . metoprolol succinate (TOPROL-XL) 50 MG 24 hr tablet TAKE ONE TABLET BY MOUTH ONCE DAILY 90 tablet 0  . Multiple Vitamin (MULTIVITAMIN) tablet Take 1 tablet by mouth daily.    . valsartan-hydrochlorothiazide (DIOVAN-HCT) 320-25 MG tablet Take 1 tablet by mouth daily. (Patient not taking: Reported on 05/14/2016) 90 tablet 3   No  facility-administered medications prior to visit.    EXAM: Gen: Alert, well appearing.  Patient is oriented to person, place, time, and situation. AFFECT: pleasant, lucid thought and speech. FBX:UXYB: no injection, icteris, swelling, or exudate.  EOMI, PERRLA. Mouth: lips without lesion/swelling.  Oral mucosa pink and moist. Oropharynx without erythema, exudate, or swelling.  CV: RRR, no m/r/g.   LUNGS: CTA bilat, nonlabored resps, good aeration in all lung fields. ABD: soft, NT/ND BACK: no CVA tenderness or low back tenderness. EXT: trace RLE pitting edema, 1-2+ LLE pitting  edema Right hip: no tenderness.  ROM intact without pain.  Pertinent labs/imaging None today  ASSESSMENT/PLAN:  1) e coli urosepsis: finish 5 more days of cipro 500 mg bid as prescribed by hospitalist.  2) HTN; ok to leave him off diovan hct at this time b/c bp at home is fine.  3) Acute-on-chronic renal failure (stage 4): we'll recheck BMET today.  4) Hx of anemia of chronic renal insufficiency: recheck CBC today.  5) BPH, with recent acute urinary retention and hematuria: doing fine with foley cath still in. Continue proscar and flomax and keep f/u appt with urologist set for tomorrow.  Medical decision making of high complexity was utilized today.  [place] UP:  10d  Signed:  Crissie Sickles, MD           05/14/2016

## 2016-05-14 NOTE — Telephone Encounter (Signed)
Gary Morgan from [facility], called stating that they needed order for OT and Home Health [facility] for help with bathing.  Dr. Anitra Lauth notified and approved order.  Home Health called and left verbal order on voice mail.

## 2016-05-15 ENCOUNTER — Encounter: Payer: Self-pay | Admitting: Family Medicine

## 2016-05-15 ENCOUNTER — Other Ambulatory Visit: Payer: Self-pay | Admitting: Family Medicine

## 2016-05-15 DIAGNOSIS — I10 Essential (primary) hypertension: Secondary | ICD-10-CM | POA: Diagnosis not present

## 2016-05-15 DIAGNOSIS — N401 Enlarged prostate with lower urinary tract symptoms: Secondary | ICD-10-CM | POA: Diagnosis not present

## 2016-05-15 DIAGNOSIS — E1122 Type 2 diabetes mellitus with diabetic chronic kidney disease: Secondary | ICD-10-CM | POA: Diagnosis not present

## 2016-05-15 DIAGNOSIS — N281 Cyst of kidney, acquired: Secondary | ICD-10-CM | POA: Insufficient documentation

## 2016-05-15 DIAGNOSIS — I129 Hypertensive chronic kidney disease with stage 1 through stage 4 chronic kidney disease, or unspecified chronic kidney disease: Secondary | ICD-10-CM | POA: Diagnosis not present

## 2016-05-15 DIAGNOSIS — R339 Retention of urine, unspecified: Secondary | ICD-10-CM | POA: Diagnosis not present

## 2016-05-15 DIAGNOSIS — N390 Urinary tract infection, site not specified: Secondary | ICD-10-CM | POA: Diagnosis not present

## 2016-05-15 DIAGNOSIS — Z466 Encounter for fitting and adjustment of urinary device: Secondary | ICD-10-CM | POA: Diagnosis not present

## 2016-05-15 MED ORDER — FINASTERIDE 5 MG PO TABS: 5.0000 mg | ORAL_TABLET | Freq: Every day | ORAL | 11 refills | Status: DC

## 2016-05-15 MED ORDER — CIPROFLOXACIN HCL 500 MG PO TABS: 500.0000 mg | ORAL_TABLET | Freq: Two times a day (BID) | ORAL | 0 refills | Status: AC

## 2016-05-16 ENCOUNTER — Telehealth: Payer: Self-pay

## 2016-05-16 ENCOUNTER — Telehealth: Payer: Self-pay | Admitting: Family Medicine

## 2016-05-16 DIAGNOSIS — T17908A Unspecified foreign body in respiratory tract, part unspecified causing other injury, initial encounter: Secondary | ICD-10-CM

## 2016-05-16 DIAGNOSIS — N401 Enlarged prostate with lower urinary tract symptoms: Secondary | ICD-10-CM | POA: Diagnosis not present

## 2016-05-16 DIAGNOSIS — N390 Urinary tract infection, site not specified: Secondary | ICD-10-CM | POA: Diagnosis not present

## 2016-05-16 DIAGNOSIS — I129 Hypertensive chronic kidney disease with stage 1 through stage 4 chronic kidney disease, or unspecified chronic kidney disease: Secondary | ICD-10-CM | POA: Diagnosis not present

## 2016-05-16 DIAGNOSIS — Z466 Encounter for fitting and adjustment of urinary device: Secondary | ICD-10-CM | POA: Diagnosis not present

## 2016-05-16 DIAGNOSIS — R339 Retention of urine, unspecified: Secondary | ICD-10-CM | POA: Diagnosis not present

## 2016-05-16 DIAGNOSIS — E1122 Type 2 diabetes mellitus with diabetic chronic kidney disease: Secondary | ICD-10-CM | POA: Diagnosis not present

## 2016-05-16 MED ORDER — MAGIC MOUTHWASH: ORAL | 2 refills | Status: DC

## 2016-05-16 NOTE — Telephone Encounter (Signed)
Magic mouthwash rx printed.

## 2016-05-16 NOTE — Telephone Encounter (Signed)
Patient's speech therapist, Tye Maryland believes that patient has thrush, can magic mouth wash be sent into pharmacy?    Also, patient's wife was asking about patient getting more cipro?  Please advise.

## 2016-05-16 NOTE — Telephone Encounter (Signed)
I ordered this but [facility] was not an option for the location.  It was limited to [facility] or [facility], so I chose [facility].

## 2016-05-16 NOTE — Telephone Encounter (Signed)
Patient's wife aware of Rx's at pharmacy.

## 2016-05-16 NOTE — Telephone Encounter (Signed)
[name] from [facility] called stating that swallowing evaluation was done on Gary Morgan and he was having difficulty swallowing.  They recommend a modified barium swallow - [facility]. Their fax# [phone]. Can you order this.

## 2016-05-17 DIAGNOSIS — Z466 Encounter for fitting and adjustment of urinary device: Secondary | ICD-10-CM | POA: Diagnosis not present

## 2016-05-17 DIAGNOSIS — R339 Retention of urine, unspecified: Secondary | ICD-10-CM | POA: Diagnosis not present

## 2016-05-17 DIAGNOSIS — N390 Urinary tract infection, site not specified: Secondary | ICD-10-CM | POA: Diagnosis not present

## 2016-05-17 DIAGNOSIS — N401 Enlarged prostate with lower urinary tract symptoms: Secondary | ICD-10-CM | POA: Diagnosis not present

## 2016-05-17 DIAGNOSIS — I129 Hypertensive chronic kidney disease with stage 1 through stage 4 chronic kidney disease, or unspecified chronic kidney disease: Secondary | ICD-10-CM | POA: Diagnosis not present

## 2016-05-17 DIAGNOSIS — E1122 Type 2 diabetes mellitus with diabetic chronic kidney disease: Secondary | ICD-10-CM | POA: Diagnosis not present

## 2016-05-19 ENCOUNTER — Other Ambulatory Visit (HOSPITAL_COMMUNITY): Payer: Self-pay | Admitting: Family Medicine

## 2016-05-19 ENCOUNTER — Telehealth: Payer: Self-pay | Admitting: Family Medicine

## 2016-05-19 DIAGNOSIS — R339 Retention of urine, unspecified: Secondary | ICD-10-CM | POA: Diagnosis not present

## 2016-05-19 DIAGNOSIS — R1310 Dysphagia, unspecified: Secondary | ICD-10-CM

## 2016-05-19 DIAGNOSIS — E1122 Type 2 diabetes mellitus with diabetic chronic kidney disease: Secondary | ICD-10-CM | POA: Diagnosis not present

## 2016-05-19 DIAGNOSIS — I129 Hypertensive chronic kidney disease with stage 1 through stage 4 chronic kidney disease, or unspecified chronic kidney disease: Secondary | ICD-10-CM | POA: Diagnosis not present

## 2016-05-19 DIAGNOSIS — N401 Enlarged prostate with lower urinary tract symptoms: Secondary | ICD-10-CM | POA: Diagnosis not present

## 2016-05-19 DIAGNOSIS — Z466 Encounter for fitting and adjustment of urinary device: Secondary | ICD-10-CM | POA: Diagnosis not present

## 2016-05-19 DIAGNOSIS — N390 Urinary tract infection, site not specified: Secondary | ICD-10-CM | POA: Diagnosis not present

## 2016-05-19 NOTE — Telephone Encounter (Signed)
Patient's daughter mentioned while making an appt that patient had been advised by the home health nurse to swallow magic mouth wash since they think he may have thrush down his throat

## 2016-05-19 NOTE — Telephone Encounter (Signed)
[name], at [facility] was notified by voice mail and detailed message left.

## 2016-05-20 ENCOUNTER — Ambulatory Visit (INDEPENDENT_AMBULATORY_CARE_PROVIDER_SITE_OTHER): Payer: Medicare Other | Admitting: Family Medicine

## 2016-05-20 ENCOUNTER — Encounter: Payer: Self-pay | Admitting: Family Medicine

## 2016-05-20 VITALS — BP 148/93 | HR 68 | Temp 97.6°F | Resp 16 | Ht 66.5 in | Wt 185.0 lb

## 2016-05-20 DIAGNOSIS — B370 Candidal stomatitis: Secondary | ICD-10-CM | POA: Diagnosis not present

## 2016-05-20 DIAGNOSIS — R338 Other retention of urine: Secondary | ICD-10-CM

## 2016-05-20 DIAGNOSIS — I10 Essential (primary) hypertension: Secondary | ICD-10-CM

## 2016-05-20 DIAGNOSIS — A4151 Sepsis due to Escherichia coli [E. coli]: Secondary | ICD-10-CM | POA: Diagnosis not present

## 2016-05-20 DIAGNOSIS — N138 Other obstructive and reflux uropathy: Secondary | ICD-10-CM

## 2016-05-20 DIAGNOSIS — N401 Enlarged prostate with lower urinary tract symptoms: Secondary | ICD-10-CM | POA: Diagnosis not present

## 2016-05-20 NOTE — Telephone Encounter (Signed)
Pt has apt today at 11:30am with Dr. Anitra Lauth will discuss then.

## 2016-05-20 NOTE — Progress Notes (Signed)
Pre visit review using our clinic review tool, if applicable. No additional management support is needed unless otherwise documented below in the visit note. 

## 2016-05-20 NOTE — Progress Notes (Signed)
OFFICE VISIT  05/20/2016   CC:  Chief Complaint  Patient presents with  . Follow-up    E.Coli, Anemia, CRI, HTN, BPH, Urinary Retention, and thursh   HPI:    Patient is a 82 y.o. Caucasian male who presents for 1 week f/u recent e coli urosepsis, BPH with acute urinary retention, acute-on-chronic renal failure, HTN.  Also with recent thrush. Saw urologist, has foley in still.  Goes back tomorrow for take foley out and have voiding trial w/ PVR check. We are going to continue with 5 additional days of cipro to treat his e coli.  Home bp's: 112-132 syst, 71-81 diast, HR 59-81.  Has a small splotch of white film on back of tongue.  No pain or discomfort in mouth.  No throat pain.  No dysphagia.     Past Medical History:  Diagnosis Date  . Arthritis of right wrist    s/p fracture of middle third of scaphoid bone sustained cleaning up from [facility] injection by Dr. Napoleon Form in [place] (ortho) made the pain go away.  Marland Kitchen BPH with obstruction/lower urinary tract symptoms    hx of urinary retention (s/p foley cath when in hosp for urosepsis 04/2016)  . Chronic low back pain   . Chronic renal insufficiency, stage III (moderate)    CrCl 30s (saw nephrologist in [place]). +microalbuminuria 04/2014 at PCP in [place]  . COPD (chronic obstructive pulmonary disease) ([place])    on CXR 09/27/2015  . History of anemia due to chronic kidney disease   . History of sepsis 04/2016   e coli  . Hypertension   . Kidney stones    one episode  . Pre-diabetes    2015 HbA1c 6.2-6.3 %    Past Surgical History:  Procedure Laterality Date  . TONSILLECTOMY  1940  . VENTRAL HERNIA REPAIR  2010   with strangulation of SB (approx 1 foot of SB had to be excised).  Had 3 total surgeries for this due to complications    Outpatient Medications Prior to Visit  Medication Sig Dispense Refill  . aspirin 81 MG tablet Take 81 mg by mouth daily.    . ciprofloxacin (CIPRO) 500 MG  tablet Take 1 tablet (500 mg total) by mouth 2 (two) times daily. 10 tablet 0  . doxazosin (CARDURA) 4 MG tablet TAKE ONE TABLET BY MOUTH AT BEDTIME 90 tablet 0  . finasteride (PROSCAR) 5 MG tablet Take 1 tablet (5 mg total) by mouth daily. 30 tablet 11  . magic mouthwash SOLN 5 ml po swish, gargle, and spit qid 200 mL 2  . metoprolol succinate (TOPROL-XL) 50 MG 24 hr tablet TAKE ONE TABLET BY MOUTH ONCE DAILY 90 tablet 0  . Multiple Vitamin (MULTIVITAMIN) tablet Take 1 tablet by mouth daily.    . tamsulosin (FLOMAX) 0.4 MG CAPS capsule Take 0.4 mg by mouth daily.     . valsartan-hydrochlorothiazide (DIOVAN-HCT) 320-25 MG tablet Take 1 tablet by mouth daily. (Patient not taking: Reported on 05/20/2016) 90 tablet 3   No facility-administered medications prior to visit.     No Known Allergies  ROS As per HPI  PE: Blood pressure (!) 148/93, pulse 68, temperature 97.6 F (36.4 C), temperature source Oral, resp. rate 16, height 5' 6.5" (1.689 m), weight 185 lb (83.9 kg), SpO2 95 %. Gen: Alert, well appearing.  Patient is oriented to person, place, time, and situation. AFFECT: pleasant, lucid thought and speech. Mouth: back left portion of tongue with approx 2 cm splotch  of white film that does scrape off a little with tongue blade.  Remainder of oral cavity and the pharynx appear without lesion. EXT: no edema  LABS:    Chemistry      Component Value Date/Time   NA 139 05/14/2016 1409   K 4.9 05/14/2016 1409   CL 100 05/14/2016 1409   CO2 30 05/14/2016 1409   BUN 33 (H) 05/14/2016 1409   CREATININE 2.43 (H) 05/14/2016 1409      Component Value Date/Time   CALCIUM 8.7 05/14/2016 1409   ALKPHOS 68 12/20/2015 1023   AST 11 12/20/2015 1023   ALT 9 12/20/2015 1023   BILITOT 0.9 12/20/2015 1023      IMPRESSION AND PLAN:  1) Oral thrush: just a small splotch on tongue is what I see.  He should continue the magic mouthwash 66ml qid swish and spit.  2) HTN: The current medical  regimen is effective;  continue present plan and medications. We can add diovan back in future if needed. Recheck Cr back to baseline.  3) e coli urosepsis: essentially resolved.  He'll finish 5 more days of cipro.  4) BPH with recent acute urinary retention after foley was removed:  He goes to urologist tomorrow for removal of foley and he'll do a voiding trial and PVR check as part of this visit tomorrow.  An After Visit Summary was printed and given to the patient.  FOLLOW UP: Return in about 6 weeks (around 07/01/2016) for routine chronic illness f/u.Recheck BMET and CBC at this visit, and check iron studies and B12 level as well.  Signed:  Crissie Sickles, MD           05/20/2016

## 2016-05-21 DIAGNOSIS — R339 Retention of urine, unspecified: Secondary | ICD-10-CM | POA: Diagnosis not present

## 2016-05-22 ENCOUNTER — Encounter: Payer: Self-pay | Admitting: Family Medicine

## 2016-05-23 DIAGNOSIS — R339 Retention of urine, unspecified: Secondary | ICD-10-CM | POA: Diagnosis not present

## 2016-05-23 DIAGNOSIS — E1122 Type 2 diabetes mellitus with diabetic chronic kidney disease: Secondary | ICD-10-CM | POA: Diagnosis not present

## 2016-05-23 DIAGNOSIS — Z466 Encounter for fitting and adjustment of urinary device: Secondary | ICD-10-CM | POA: Diagnosis not present

## 2016-05-23 DIAGNOSIS — I129 Hypertensive chronic kidney disease with stage 1 through stage 4 chronic kidney disease, or unspecified chronic kidney disease: Secondary | ICD-10-CM | POA: Diagnosis not present

## 2016-05-23 DIAGNOSIS — N390 Urinary tract infection, site not specified: Secondary | ICD-10-CM | POA: Diagnosis not present

## 2016-05-23 DIAGNOSIS — N401 Enlarged prostate with lower urinary tract symptoms: Secondary | ICD-10-CM | POA: Diagnosis not present

## 2016-05-26 ENCOUNTER — Telehealth: Payer: Self-pay | Admitting: *Deleted

## 2016-05-26 ENCOUNTER — Ambulatory Visit: Payer: Medicare Other | Admitting: Family Medicine

## 2016-05-26 DIAGNOSIS — Z7982 Long term (current) use of aspirin: Secondary | ICD-10-CM | POA: Diagnosis not present

## 2016-05-26 DIAGNOSIS — R319 Hematuria, unspecified: Secondary | ICD-10-CM | POA: Diagnosis not present

## 2016-05-26 DIAGNOSIS — E1122 Type 2 diabetes mellitus with diabetic chronic kidney disease: Secondary | ICD-10-CM | POA: Diagnosis not present

## 2016-05-26 DIAGNOSIS — Z79899 Other long term (current) drug therapy: Secondary | ICD-10-CM | POA: Diagnosis not present

## 2016-05-26 DIAGNOSIS — N281 Cyst of kidney, acquired: Secondary | ICD-10-CM | POA: Diagnosis not present

## 2016-05-26 DIAGNOSIS — R339 Retention of urine, unspecified: Secondary | ICD-10-CM | POA: Diagnosis not present

## 2016-05-26 DIAGNOSIS — K5730 Diverticulosis of large intestine without perforation or abscess without bleeding: Secondary | ICD-10-CM | POA: Diagnosis not present

## 2016-05-26 DIAGNOSIS — Z87891 Personal history of nicotine dependence: Secondary | ICD-10-CM | POA: Diagnosis not present

## 2016-05-26 DIAGNOSIS — N400 Enlarged prostate without lower urinary tract symptoms: Secondary | ICD-10-CM | POA: Diagnosis not present

## 2016-05-26 DIAGNOSIS — N184 Chronic kidney disease, stage 4 (severe): Secondary | ICD-10-CM | POA: Diagnosis not present

## 2016-05-26 DIAGNOSIS — I129 Hypertensive chronic kidney disease with stage 1 through stage 4 chronic kidney disease, or unspecified chronic kidney disease: Secondary | ICD-10-CM | POA: Diagnosis not present

## 2016-05-26 NOTE — Telephone Encounter (Signed)
FYI

## 2016-05-26 NOTE — Telephone Encounter (Signed)
Pts wife advised and voiced understanding, okay per DPR. 

## 2016-05-26 NOTE — Telephone Encounter (Signed)
Pts wife called and stated that pt is almost out of the magic mouthwash. She stated that pt still has a few areas on his tongue. She wants to know should this be refilled or wait til his next apt, which is January 8th. Please advise. Pharm: [facility] [address]

## 2016-05-26 NOTE — Telephone Encounter (Signed)
I think he can stop the magic mouthwash when he runs out and we'll see how he does.-thx

## 2016-05-27 DIAGNOSIS — Z466 Encounter for fitting and adjustment of urinary device: Secondary | ICD-10-CM | POA: Diagnosis not present

## 2016-05-27 DIAGNOSIS — R339 Retention of urine, unspecified: Secondary | ICD-10-CM | POA: Diagnosis not present

## 2016-05-27 DIAGNOSIS — I129 Hypertensive chronic kidney disease with stage 1 through stage 4 chronic kidney disease, or unspecified chronic kidney disease: Secondary | ICD-10-CM | POA: Diagnosis not present

## 2016-05-27 DIAGNOSIS — N401 Enlarged prostate with lower urinary tract symptoms: Secondary | ICD-10-CM | POA: Diagnosis not present

## 2016-05-27 DIAGNOSIS — N390 Urinary tract infection, site not specified: Secondary | ICD-10-CM | POA: Diagnosis not present

## 2016-05-27 DIAGNOSIS — E1122 Type 2 diabetes mellitus with diabetic chronic kidney disease: Secondary | ICD-10-CM | POA: Diagnosis not present

## 2016-05-29 DIAGNOSIS — N390 Urinary tract infection, site not specified: Secondary | ICD-10-CM | POA: Diagnosis not present

## 2016-05-29 DIAGNOSIS — I129 Hypertensive chronic kidney disease with stage 1 through stage 4 chronic kidney disease, or unspecified chronic kidney disease: Secondary | ICD-10-CM | POA: Diagnosis not present

## 2016-05-29 DIAGNOSIS — N401 Enlarged prostate with lower urinary tract symptoms: Secondary | ICD-10-CM | POA: Diagnosis not present

## 2016-05-29 DIAGNOSIS — Z466 Encounter for fitting and adjustment of urinary device: Secondary | ICD-10-CM | POA: Diagnosis not present

## 2016-05-29 DIAGNOSIS — E1122 Type 2 diabetes mellitus with diabetic chronic kidney disease: Secondary | ICD-10-CM | POA: Diagnosis not present

## 2016-05-29 DIAGNOSIS — R339 Retention of urine, unspecified: Secondary | ICD-10-CM | POA: Diagnosis not present

## 2016-05-30 ENCOUNTER — Other Ambulatory Visit (HOSPITAL_COMMUNITY): Payer: Medicare Other

## 2016-05-30 ENCOUNTER — Ambulatory Visit (HOSPITAL_COMMUNITY): Payer: Medicare Other

## 2016-05-30 DIAGNOSIS — R339 Retention of urine, unspecified: Secondary | ICD-10-CM | POA: Diagnosis not present

## 2016-06-02 ENCOUNTER — Encounter: Payer: Self-pay | Admitting: Family Medicine

## 2016-06-02 ENCOUNTER — Telehealth: Payer: Self-pay | Admitting: Family Medicine

## 2016-06-02 NOTE — Telephone Encounter (Signed)
Addressed in a previous message.

## 2016-06-02 NOTE — Telephone Encounter (Signed)
If pt is swallowing w/out problem now then it is ok to cancel swallowing study.-thx

## 2016-06-02 NOTE — Telephone Encounter (Signed)
Pts wife advised and voiced understanding, okay per DPR. 

## 2016-06-02 NOTE — Telephone Encounter (Signed)
Please advise. Thanks.  

## 2016-06-02 NOTE — Telephone Encounter (Signed)
Patient is swallowing better & is going to cancel his swallowing study. Please call patient's wife back.

## 2016-06-03 DIAGNOSIS — I129 Hypertensive chronic kidney disease with stage 1 through stage 4 chronic kidney disease, or unspecified chronic kidney disease: Secondary | ICD-10-CM | POA: Diagnosis not present

## 2016-06-03 DIAGNOSIS — N401 Enlarged prostate with lower urinary tract symptoms: Secondary | ICD-10-CM | POA: Diagnosis not present

## 2016-06-03 DIAGNOSIS — N390 Urinary tract infection, site not specified: Secondary | ICD-10-CM | POA: Diagnosis not present

## 2016-06-03 DIAGNOSIS — E1122 Type 2 diabetes mellitus with diabetic chronic kidney disease: Secondary | ICD-10-CM | POA: Diagnosis not present

## 2016-06-03 DIAGNOSIS — Z466 Encounter for fitting and adjustment of urinary device: Secondary | ICD-10-CM | POA: Diagnosis not present

## 2016-06-03 DIAGNOSIS — R339 Retention of urine, unspecified: Secondary | ICD-10-CM | POA: Diagnosis not present

## 2016-06-05 DIAGNOSIS — R339 Retention of urine, unspecified: Secondary | ICD-10-CM | POA: Diagnosis not present

## 2016-06-05 DIAGNOSIS — N390 Urinary tract infection, site not specified: Secondary | ICD-10-CM | POA: Diagnosis not present

## 2016-06-05 DIAGNOSIS — Z466 Encounter for fitting and adjustment of urinary device: Secondary | ICD-10-CM | POA: Diagnosis not present

## 2016-06-05 DIAGNOSIS — N401 Enlarged prostate with lower urinary tract symptoms: Secondary | ICD-10-CM | POA: Diagnosis not present

## 2016-06-05 DIAGNOSIS — E1122 Type 2 diabetes mellitus with diabetic chronic kidney disease: Secondary | ICD-10-CM | POA: Diagnosis not present

## 2016-06-05 DIAGNOSIS — I129 Hypertensive chronic kidney disease with stage 1 through stage 4 chronic kidney disease, or unspecified chronic kidney disease: Secondary | ICD-10-CM | POA: Diagnosis not present

## 2016-06-10 DIAGNOSIS — R338 Other retention of urine: Secondary | ICD-10-CM | POA: Diagnosis not present

## 2016-06-10 DIAGNOSIS — R339 Retention of urine, unspecified: Secondary | ICD-10-CM | POA: Diagnosis not present

## 2016-06-11 DIAGNOSIS — N319 Neuromuscular dysfunction of bladder, unspecified: Secondary | ICD-10-CM

## 2016-06-11 HISTORY — DX: Neuromuscular dysfunction of bladder, unspecified: N31.9

## 2016-06-12 DIAGNOSIS — N401 Enlarged prostate with lower urinary tract symptoms: Secondary | ICD-10-CM | POA: Diagnosis not present

## 2016-06-12 DIAGNOSIS — E1122 Type 2 diabetes mellitus with diabetic chronic kidney disease: Secondary | ICD-10-CM | POA: Diagnosis not present

## 2016-06-12 DIAGNOSIS — R339 Retention of urine, unspecified: Secondary | ICD-10-CM | POA: Diagnosis not present

## 2016-06-12 DIAGNOSIS — Z466 Encounter for fitting and adjustment of urinary device: Secondary | ICD-10-CM | POA: Diagnosis not present

## 2016-06-12 DIAGNOSIS — I129 Hypertensive chronic kidney disease with stage 1 through stage 4 chronic kidney disease, or unspecified chronic kidney disease: Secondary | ICD-10-CM | POA: Diagnosis not present

## 2016-06-12 DIAGNOSIS — N390 Urinary tract infection, site not specified: Secondary | ICD-10-CM | POA: Diagnosis not present

## 2016-06-17 ENCOUNTER — Other Ambulatory Visit: Payer: Self-pay | Admitting: Family Medicine

## 2016-06-17 ENCOUNTER — Encounter: Payer: Self-pay | Admitting: *Deleted

## 2016-06-17 ENCOUNTER — Telehealth: Payer: Self-pay | Admitting: Family Medicine

## 2016-06-17 MED ORDER — VALSARTAN 160 MG PO TABS: 160.0000 mg | ORAL_TABLET | Freq: Every day | ORAL | 3 refills | Status: DC

## 2016-06-17 NOTE — Telephone Encounter (Signed)
Patient states that his BP has been going up since stopping some of his medications at dr request.  Pt's highest BP is around 155/ 99.  Should patient restart any medications.   Please advise.

## 2016-06-17 NOTE — Telephone Encounter (Signed)
Yes, taking all his meds at bedtime is ok.-thx

## 2016-06-17 NOTE — Telephone Encounter (Signed)
MyChart message sent per pts wife request.

## 2016-06-17 NOTE — Telephone Encounter (Signed)
I'll send in generic Diovan 160mg  once daily.

## 2016-06-17 NOTE — Telephone Encounter (Signed)
Please advise. Thanks.  

## 2016-06-17 NOTE — Telephone Encounter (Signed)
Pts wife advised and voiced understanding, okay per DPR. She wanted to know if pt could take all of his medications at bedtime. Please advise. Thanks.

## 2016-06-19 DIAGNOSIS — R339 Retention of urine, unspecified: Secondary | ICD-10-CM | POA: Diagnosis not present

## 2016-06-19 DIAGNOSIS — N390 Urinary tract infection, site not specified: Secondary | ICD-10-CM | POA: Diagnosis not present

## 2016-06-19 DIAGNOSIS — N401 Enlarged prostate with lower urinary tract symptoms: Secondary | ICD-10-CM | POA: Diagnosis not present

## 2016-06-19 DIAGNOSIS — I129 Hypertensive chronic kidney disease with stage 1 through stage 4 chronic kidney disease, or unspecified chronic kidney disease: Secondary | ICD-10-CM | POA: Diagnosis not present

## 2016-06-19 DIAGNOSIS — E1122 Type 2 diabetes mellitus with diabetic chronic kidney disease: Secondary | ICD-10-CM | POA: Diagnosis not present

## 2016-06-19 DIAGNOSIS — Z466 Encounter for fitting and adjustment of urinary device: Secondary | ICD-10-CM | POA: Diagnosis not present

## 2016-06-20 ENCOUNTER — Ambulatory Visit: Payer: Medicare Other | Admitting: Family Medicine

## 2016-06-27 DIAGNOSIS — E1122 Type 2 diabetes mellitus with diabetic chronic kidney disease: Secondary | ICD-10-CM | POA: Diagnosis not present

## 2016-06-27 DIAGNOSIS — Z7982 Long term (current) use of aspirin: Secondary | ICD-10-CM | POA: Diagnosis not present

## 2016-06-27 DIAGNOSIS — Z87891 Personal history of nicotine dependence: Secondary | ICD-10-CM | POA: Diagnosis not present

## 2016-06-27 DIAGNOSIS — N184 Chronic kidney disease, stage 4 (severe): Secondary | ICD-10-CM | POA: Diagnosis not present

## 2016-06-27 DIAGNOSIS — N400 Enlarged prostate without lower urinary tract symptoms: Secondary | ICD-10-CM | POA: Diagnosis not present

## 2016-06-27 DIAGNOSIS — R339 Retention of urine, unspecified: Secondary | ICD-10-CM | POA: Diagnosis not present

## 2016-06-27 DIAGNOSIS — T83091A Other mechanical complication of indwelling urethral catheter, initial encounter: Secondary | ICD-10-CM | POA: Diagnosis not present

## 2016-06-27 DIAGNOSIS — R319 Hematuria, unspecified: Secondary | ICD-10-CM | POA: Diagnosis not present

## 2016-06-27 DIAGNOSIS — Z79899 Other long term (current) drug therapy: Secondary | ICD-10-CM | POA: Diagnosis not present

## 2016-06-27 DIAGNOSIS — I129 Hypertensive chronic kidney disease with stage 1 through stage 4 chronic kidney disease, or unspecified chronic kidney disease: Secondary | ICD-10-CM | POA: Diagnosis not present

## 2016-06-27 DIAGNOSIS — T83098A Other mechanical complication of other indwelling urethral catheter, initial encounter: Secondary | ICD-10-CM | POA: Diagnosis not present

## 2016-06-27 DIAGNOSIS — N390 Urinary tract infection, site not specified: Secondary | ICD-10-CM | POA: Diagnosis not present

## 2016-06-30 DIAGNOSIS — R972 Elevated prostate specific antigen [PSA]: Secondary | ICD-10-CM

## 2016-06-30 HISTORY — DX: Elevated prostate specific antigen (PSA): R97.20

## 2016-07-06 ENCOUNTER — Encounter: Payer: Self-pay | Admitting: Family Medicine

## 2016-07-07 ENCOUNTER — Encounter: Payer: Self-pay | Admitting: Family Medicine

## 2016-07-07 ENCOUNTER — Ambulatory Visit (INDEPENDENT_AMBULATORY_CARE_PROVIDER_SITE_OTHER): Payer: Medicare Other | Admitting: Family Medicine

## 2016-07-07 VITALS — BP 188/96 | HR 66 | Temp 97.7°F | Resp 16 | Wt 185.0 lb

## 2016-07-07 DIAGNOSIS — R7303 Prediabetes: Secondary | ICD-10-CM | POA: Diagnosis not present

## 2016-07-07 DIAGNOSIS — N183 Chronic kidney disease, stage 3 unspecified: Secondary | ICD-10-CM

## 2016-07-07 DIAGNOSIS — N401 Enlarged prostate with lower urinary tract symptoms: Secondary | ICD-10-CM | POA: Diagnosis not present

## 2016-07-07 DIAGNOSIS — I10 Essential (primary) hypertension: Secondary | ICD-10-CM

## 2016-07-07 DIAGNOSIS — N138 Other obstructive and reflux uropathy: Secondary | ICD-10-CM

## 2016-07-07 DIAGNOSIS — N390 Urinary tract infection, site not specified: Secondary | ICD-10-CM

## 2016-07-07 LAB — POCT GLYCOSYLATED HEMOGLOBIN (HGB A1C): Hemoglobin A1C: 5.4

## 2016-07-07 MED ORDER — DOXAZOSIN MESYLATE 4 MG PO TABS: 4.0000 mg | ORAL_TABLET | Freq: Every day | ORAL | 1 refills | Status: DC

## 2016-07-07 MED ORDER — VALSARTAN-HYDROCHLOROTHIAZIDE 320-25 MG PO TABS: 1.0000 | ORAL_TABLET | Freq: Every day | ORAL | 1 refills | Status: DC

## 2016-07-07 MED ORDER — METOPROLOL SUCCINATE ER 50 MG PO TB24: 50.0000 mg | ORAL_TABLET | Freq: Every day | ORAL | 1 refills | Status: DC

## 2016-07-07 NOTE — Progress Notes (Signed)
OFFICE VISIT  07/07/2016   CC:  Chief Complaint  Patient presents with  . Follow-up    HTN   HPI:    Patient is a 83 y.o.  male who presents for f/u HTN, CRI stage III/IV, prediabetes. He recently had another ED visit ([facility]) for urinary retention (his foley cath was obstructed and had to be changed out), was found to have UTI and uncontrolled HTN.  He was put on keflex bu this was changed to cipro, which he finishes tomorrow (1 week course).  No change in bp meds was made. His urine clx grew pseudomonas and enterococcus faecalis, pan-sensitive.  Pt w/out acute complaint today. Plan with urologist is to get TURP.  Next office f/u with urol is in 4d.  Pt can't wait!  His home bps are still back up in 190s-220s over 90s-120s.   Labs done at recent 06/27/16 ED visit showed stable Cr/GFR as well as stable Hb.   Past Medical History:  Diagnosis Date  . Arthritis of right wrist    s/p fracture of middle third of scaphoid bone sustained cleaning up from [facility] injection by Dr. Napoleon Form in [place] (ortho) made the pain go away.  Marland Kitchen BPH with obstruction/lower urinary tract symptoms    hx of urinary retention (s/p foley cath when in hosp for urosepsis 04/2016).    . Chronic low back pain   . Chronic renal insufficiency, stage III (moderate)    CrCl 30s (saw nephrologist in [place]). +microalbuminuria 04/2014 at PCP in [place]  . COPD (chronic obstructive pulmonary disease) ([place])    on CXR 09/27/2015  . History of anemia due to chronic kidney disease   . History of sepsis 04/2016   e coli  . Hypertension   . Kidney stones    one episode  . Pre-diabetes    2015 HbA1c 6.2-6.3 %  . Recurrent UTI 2017    Past Surgical History:  Procedure Laterality Date  . TONSILLECTOMY  1940  . VENTRAL HERNIA REPAIR  2010   with strangulation of SB (approx 1 foot of SB had to be excised).  Had 3 total surgeries for this due to complications    Outpatient  Medications Prior to Visit  Medication Sig Dispense Refill  . aspirin 81 MG tablet Take 81 mg by mouth daily.    . finasteride (PROSCAR) 5 MG tablet Take 1 tablet (5 mg total) by mouth daily. 30 tablet 11  . Multiple Vitamin (MULTIVITAMIN) tablet Take 1 tablet by mouth daily.    Marland Kitchen doxazosin (CARDURA) 4 MG tablet TAKE ONE TABLET BY MOUTH AT BEDTIME 90 tablet 0  . metoprolol succinate (TOPROL-XL) 50 MG 24 hr tablet TAKE ONE TABLET BY MOUTH ONCE DAILY 90 tablet 0  . valsartan (DIOVAN) 160 MG tablet Take 1 tablet (160 mg total) by mouth daily. 30 tablet 3  . magic mouthwash SOLN 5 ml po swish, gargle, and spit qid (Patient not taking: Reported on 07/07/2016) 200 mL 2   No facility-administered medications prior to visit.     No Known Allergies  ROS As per HPI  PE: Blood pressure (!) 188/96, pulse 66, temperature 97.7 F (36.5 C), temperature source Oral, resp. rate 16, weight 185 lb (83.9 kg), SpO2 97 %. Gen: Alert, well appearing.  Patient is oriented to person, place, time, and situation. AFFECT: pleasant, lucid thought and speech. CV: RRR, no m/r/g.   LUNGS: CTA bilat, nonlabored resps, good aeration in all lung fields. ABD: soft, NT/ND  LABS:  Chemistry      Component Value Date/Time   NA 139 05/14/2016 1409   K 4.9 05/14/2016 1409   CL 100 05/14/2016 1409   CO2 30 05/14/2016 1409   BUN 33 (H) 05/14/2016 1409   CREATININE 2.43 (H) 05/14/2016 1409      Component Value Date/Time   CALCIUM 8.7 05/14/2016 1409   ALKPHOS 68 12/20/2015 1023   AST 11 12/20/2015 1023   ALT 9 12/20/2015 1023   BILITOT 0.9 12/20/2015 1023     Lab Results  Component Value Date   WBC 11.7 (H) 05/14/2016   HGB 12.1 Repeated and verified X2. (L) 05/14/2016   HCT 36.4 (L) 05/14/2016   MCV 91.7 05/14/2016   PLT 375.0 05/14/2016   No results found for: CHOL, HDL, LDLCALC, LDLDIRECT, TRIG, CHOLHDL Lab Results  Component Value Date   HGBA1C 6.3 12/20/2015   POCT HbA1c today was  5.4%  IMPRESSION AND PLAN:  1) BPH, with LUT obstruction, recurrent urinary retention, has chronic indwelling foley cath for the last 2 mo. Recent UTI treated appropriately with cipro by the ED.  Recent cath change helpful. Has f/u in 4 days with his urologist to hopefully discuss TURP.  2) Uncontrolled HTN: d/c diovan 160 and restart the diovan HCT 320/25 that he was on prior to his initial bout of urosepsis several months ago.  3) CRI stage III/IV: GFR around 30 ml/min.  Stable as of recent chemistries done at 06/27/16 [facility] ED visit.  4) Prediabetes: HbA1c down from 6.3% to 5.4% over the last 6 mo.  Continue good diet.  An After Visit Summary was printed and given to the patient.  FOLLOW UP: Return in about 10 days (around 07/17/2016) for f/u HTN.  Signed:  Crissie Sickles, MD           07/07/2016

## 2016-07-07 NOTE — Patient Instructions (Signed)
Stop tamsulosin (flomax).

## 2016-07-11 DIAGNOSIS — N400 Enlarged prostate without lower urinary tract symptoms: Secondary | ICD-10-CM | POA: Diagnosis not present

## 2016-07-11 DIAGNOSIS — I10 Essential (primary) hypertension: Secondary | ICD-10-CM | POA: Diagnosis not present

## 2016-07-11 LAB — PSA: PSA: 7

## 2016-07-14 ENCOUNTER — Encounter: Payer: Self-pay | Admitting: Family Medicine

## 2016-07-15 ENCOUNTER — Encounter: Payer: Self-pay | Admitting: Family Medicine

## 2016-07-15 NOTE — Telephone Encounter (Signed)
Please advise. Thanks.  

## 2016-07-18 NOTE — Telephone Encounter (Signed)
FYI

## 2016-07-21 ENCOUNTER — Encounter: Payer: Self-pay | Admitting: Family Medicine

## 2016-07-21 ENCOUNTER — Ambulatory Visit (INDEPENDENT_AMBULATORY_CARE_PROVIDER_SITE_OTHER): Payer: Medicare Other | Admitting: Family Medicine

## 2016-07-21 VITALS — BP 155/82 | HR 54 | Temp 95.6°F | Resp 16 | Wt 185.0 lb

## 2016-07-21 DIAGNOSIS — N319 Neuromuscular dysfunction of bladder, unspecified: Secondary | ICD-10-CM

## 2016-07-21 DIAGNOSIS — N401 Enlarged prostate with lower urinary tract symptoms: Secondary | ICD-10-CM

## 2016-07-21 DIAGNOSIS — R338 Other retention of urine: Secondary | ICD-10-CM

## 2016-07-21 DIAGNOSIS — R972 Elevated prostate specific antigen [PSA]: Secondary | ICD-10-CM

## 2016-07-21 DIAGNOSIS — N183 Chronic kidney disease, stage 3 unspecified: Secondary | ICD-10-CM

## 2016-07-21 DIAGNOSIS — I10 Essential (primary) hypertension: Secondary | ICD-10-CM

## 2016-07-21 DIAGNOSIS — R339 Retention of urine, unspecified: Secondary | ICD-10-CM | POA: Diagnosis not present

## 2016-07-21 LAB — BASIC METABOLIC PANEL
BUN: 35 mg/dL — AB (ref 6–23)
CHLORIDE: 100 meq/L (ref 96–112)
CO2: 32 meq/L (ref 19–32)
Calcium: 9.5 mg/dL (ref 8.4–10.5)
Creatinine, Ser: 2.31 mg/dL — ABNORMAL HIGH (ref 0.40–1.50)
GFR: 28.91 mL/min — AB (ref >60.00)
GLUCOSE: 131 mg/dL — AB (ref 70–99)
POTASSIUM: 4.5 meq/L (ref 3.5–5.1)
SODIUM: 140 meq/L (ref 135–145)

## 2016-07-21 MED ORDER — DOXAZOSIN MESYLATE 8 MG PO TABS: 8.0000 mg | ORAL_TABLET | Freq: Every day | ORAL | 6 refills | Status: DC

## 2016-07-21 NOTE — Progress Notes (Signed)
OFFICE VISIT  07/21/2016   CC:  Chief Complaint  Patient presents with  . Hypertension    follow up   HPI:    Patient is a 83 y.o.  male who presents for 2 wk f/u HTN. Last visit we increase him from diovan 160 qd to diovan HCT 320/25 qd. Home bp's now improving some: 150s-low 200s, over 90s-100s.  HR 50s.  Currently says he feels very well.  Also with recent problems with BPH with lower urinary tract obstruction/recurrent urinary retention.  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  Also mildly elevated PSA--sounds like plan is to repeat in 6 mo.. Was seeing urologist, Dr. Redmond Pulling, with [facility], but pt and family no longer want to continue seeing him b/c of what they describe as poor bedside manner, so they request new urologist referral today. He is currently doing self cath tid. Has some stinging/dribbling urine by the time he cath's himself.  No fevers, no hematuria.  Labs done at recent 06/27/16 ED visit showed stable Cr/GFR as well as stable Hb.   Past Medical History:  Diagnosis Date  . Arthritis of right wrist    s/p fracture of middle third of scaphoid bone sustained cleaning up from [facility] injection by Dr. Napoleon Form in [place] (ortho) made the pain go away.  Marland Kitchen BPH with obstruction/lower urinary tract symptoms    hx of urinary retention (s/p foley cath when in hosp for urosepsis 04/2016).    . Chronic low back pain   . Chronic renal insufficiency, stage III (moderate)    CrCl 30s (saw nephrologist in [place]). +microalbuminuria 04/2014 at PCP in [place]  . COPD (chronic obstructive pulmonary disease) ([place])    on CXR 09/27/2015  . Elevated PSA 06/2016   Dr. Redmond Pulling ([facility] urologist)-awaiting records (pt's wife reported pt's psa was 7).  . History of anemia due to chronic kidney disease   . History of sepsis 04/2016   e coli  . Hypertension   . Kidney  stones    one episode  . Pre-diabetes    2015 HbA1c 6.2-6.3 %  . Recurrent UTI 2017    Past Surgical History:  Procedure Laterality Date  . TONSILLECTOMY  1940  . VENTRAL HERNIA REPAIR  2010   with strangulation of SB (approx 1 foot of SB had to be excised).  Had 3 total surgeries for this due to complications    Outpatient Medications Prior to Visit  Medication Sig Dispense Refill  . aspirin 81 MG tablet Take 81 mg by mouth daily.    . finasteride (PROSCAR) 5 MG tablet Take 1 tablet (5 mg total) by mouth daily. 30 tablet 11  . metoprolol succinate (TOPROL-XL) 50 MG 24 hr tablet Take 1 tablet (50 mg total) by mouth daily. Take with or immediately following a meal. 90 tablet 1  . Multiple Vitamin (MULTIVITAMIN) tablet Take 1 tablet by mouth daily.    . valsartan-hydrochlorothiazide (DIOVAN-HCT) 320-25 MG tablet Take 1 tablet by mouth daily. 90 tablet 1  . doxazosin (CARDURA) 4 MG tablet Take 1 tablet (4 mg total) by mouth at bedtime. 90 tablet 1  . magic mouthwash SOLN 5 ml po swish, gargle, and spit qid (Patient not taking: Reported on 07/21/2016) 200 mL 2   No facility-administered medications prior to visit.     No Known Allergies  ROS As per HPI  PE: Blood pressure (!) 155/82, pulse (!) 54,  temperature (!) 95.6 F (35.3 C), temperature source Oral, resp. rate 16, weight 185 lb (83.9 kg), SpO2 96 %. Gen: Alert, well appearing.  Patient is oriented to person, place, time, and situation. CV: RRR, distant S1, S2.  No m/r/g. Chest is clear, no wheezing or rales. Normal symmetric air entry throughout both lung fields. No chest wall deformities or tenderness. EXT: no clubbing, cyanosis, or edema.   LABS:    Chemistry      Component Value Date/Time   NA 139 05/14/2016 1409   K 4.9 05/14/2016 1409   CL 100 05/14/2016 1409   CO2 30 05/14/2016 1409   BUN 33 (H) 05/14/2016 1409   CREATININE 2.43 (H) 05/14/2016 1409      Component Value Date/Time   CALCIUM 8.7 05/14/2016 1409    ALKPHOS 68 12/20/2015 1023   AST 11 12/20/2015 1023   ALT 9 12/20/2015 1023   BILITOT 0.9 12/20/2015 1023       IMPRESSION AND PLAN:  1) Uncontrolled HTN: improving some. Increase cardura to 8 mg qd. Continue other bp meds at current doses. Check BMET today.  2) H/x urinary retention; with BPH and acontractile bladder on recent urologic testing with Dr. Redmond Pulling. Continue I/o cath--increase frequency to qid b/c it sounds like he is having some overflow incontinence with TID self cath. Refer to [facility] at the request of pt/family due to non-compassionate care from prev urol.  3) CRI stage III (GFR in 30s): monitor lytes/cr today.  Spent 25 min with pt today, with >50% of this time spent in counseling and care coordination regarding the above problems.  FOLLOW UP: Return in about 3 weeks (around 08/11/2016) for f/u HTN.  Signed:  Crissie Sickles, MD           07/21/2016

## 2016-08-13 ENCOUNTER — Ambulatory Visit (INDEPENDENT_AMBULATORY_CARE_PROVIDER_SITE_OTHER): Payer: Medicare Other | Admitting: Family Medicine

## 2016-08-13 ENCOUNTER — Encounter: Payer: Self-pay | Admitting: Family Medicine

## 2016-08-13 VITALS — BP 141/83 | HR 68 | Temp 97.8°F | Resp 16 | Ht 66.5 in | Wt 184.2 lb

## 2016-08-13 DIAGNOSIS — I10 Essential (primary) hypertension: Secondary | ICD-10-CM

## 2016-08-13 MED ORDER — AMLODIPINE BESYLATE 10 MG PO TABS: 10.0000 mg | ORAL_TABLET | Freq: Every day | ORAL | 6 refills | Status: DC

## 2016-08-13 NOTE — Patient Instructions (Signed)
Check with your pharmacy about a Rx for your metoprolol (Toprol XL) that I sent them 06/2016---3 month supply with 1 RF.

## 2016-08-13 NOTE — Progress Notes (Signed)
Pre visit review using our clinic review tool, if applicable. No additional management support is needed unless otherwise documented below in the visit note. 

## 2016-08-13 NOTE — Progress Notes (Signed)
OFFICE VISIT  08/13/2016   CC:  Chief Complaint  Patient presents with  . Follow-up    HTN, not fasting.    HPI:    Patient is a 83 y.o.  male who presents for 3 week f/u uncontrolled HTN. Last visit we changed his diovan to diovan/HCT max dose. Home monitoring:  170s.  His documentation of his numbers is confusing, doesn't look like he is documenting his diastolics.  HR 50s.  Using wrist cuff.  Says he has an upper arm cuff at home, not using.  Still self cathing 3-4 times per day, denies new complaint on this problem.  He has not contacted [facility] for appt yet but says he will.   Past Medical History:  Diagnosis Date  . Arthritis of right wrist    s/p fracture of middle third of scaphoid bone sustained cleaning up from [facility] injection by Dr. Napoleon Form in [place] (ortho) made the pain go away.  Marland Kitchen BPH with obstruction/lower urinary tract symptoms    hx of urinary retention (s/p foley cath when in hosp for urosepsis 04/2016).    . Chronic low back pain   . Chronic renal insufficiency, stage III (moderate)    CrCl 30s (saw nephrologist in [place]). +microalbuminuria 04/2014 at PCP in [place]  . COPD (chronic obstructive pulmonary disease) ([place])    on CXR 09/27/2015  . Elevated PSA 06/2016   Dr. Redmond Pulling ([facility] urologist)-awaiting records (pt's wife reported pt's psa was 7).  . History of anemia due to chronic kidney disease   . History of sepsis 04/2016   e coli  . Hypertension   . Kidney stones    one episode  . Pre-diabetes    2015 HbA1c 6.2-6.3 %.  HbA1c 06/2016 5.4%.  . Recurrent UTI 2017    Past Surgical History:  Procedure Laterality Date  . TONSILLECTOMY  1940  . VENTRAL HERNIA REPAIR  2010   with strangulation of SB (approx 1 foot of SB had to be excised).  Had 3 total surgeries for this due to complications    Outpatient Medications Prior to Visit  Medication Sig Dispense Refill  . aspirin 81 MG tablet Take 81 mg by mouth  daily.    Marland Kitchen doxazosin (CARDURA) 8 MG tablet Take 1 tablet (8 mg total) by mouth daily. 30 tablet 6  . finasteride (PROSCAR) 5 MG tablet Take 1 tablet (5 mg total) by mouth daily. 30 tablet 11  . metoprolol succinate (TOPROL-XL) 50 MG 24 hr tablet Take 1 tablet (50 mg total) by mouth daily. Take with or immediately following a meal. 90 tablet 1  . Multiple Vitamin (MULTIVITAMIN) tablet Take 1 tablet by mouth daily.    . valsartan-hydrochlorothiazide (DIOVAN-HCT) 320-25 MG tablet Take 1 tablet by mouth daily. 90 tablet 1  . magic mouthwash SOLN 5 ml po swish, gargle, and spit qid (Patient not taking: Reported on 07/21/2016) 200 mL 2   No facility-administered medications prior to visit.     No Known Allergies  ROS As per HPI  PE: Blood pressure (!) 141/83, pulse 68, temperature 97.8 F (36.6 C), temperature source Oral, resp. rate 16, height 5' 6.5" (1.689 m), weight 184 lb 4 oz (83.6 kg), SpO2 95 %. Gen: Alert, well appearing.  Patient is oriented to person, place, time, and situation. AFFECT: pleasant, lucid thought and speech. CV: RRR, no m/r/g.   LUNGS: CTA bilat, nonlabored resps, good aeration in all lung fields. EXT: no clubbing, cyanosis, or edema.  LABS:    Chemistry      Component Value Date/Time   NA 140 07/21/2016 0931   K 4.5 07/21/2016 0931   CL 100 07/21/2016 0931   CO2 32 07/21/2016 0931   BUN 35 (H) 07/21/2016 0931   CREATININE 2.31 (H) 07/21/2016 0931      Component Value Date/Time   CALCIUM 9.5 07/21/2016 0931   ALKPHOS 68 12/20/2015 1023   AST 11 12/20/2015 1023   ALT 9 12/20/2015 1023   BILITOT 0.9 12/20/2015 1023     GFR on 07/21/16= 30 ml/min (stable)  Lab Results  Component Value Date   HGBA1C 5.4 07/07/2016   IMPRESSION AND PLAN:  1) Uncontrolled HTN; also some confusion about home monitoring numbers. Switch to upper arm cuff.  Bring this in for nurse visit to compare its measurement to our bp machine. Start amlodipine 10mg  qd and continue  all current bp meds. Recent lytes/cr < 1 mo ago stable. Plan recheck BMET 2 wks.  An After Visit Summary was printed and given to the patient.  FOLLOW UP: Return for 2 weeks o/v f/u HTN.  Nurse visit at pt's convenience for comparison of pt's bp cuff with our machin.  Signed:  Crissie Sickles, MD           08/13/2016

## 2016-08-15 ENCOUNTER — Telehealth: Payer: Self-pay | Admitting: Family Medicine

## 2016-08-15 NOTE — Telephone Encounter (Signed)
Patient Name: Gary Morgan DOB: 08/19/1931 Initial Comment Caller states her husband just started a new medication, shaking. Amlodipine 10mg . Very weak, walking into walls. Nurse Assessment Nurse: Andria Frames, RN, Aeriel Date/Time (Eastern Time): 08/15/2016 10:18:08 AM Confirm and document reason for call. If symptomatic, describe symptoms. ---Caller states her husband just started a new medication, shaking. Amlodipine 10mg . The first night he was very weak, walking into walls. Caller states, today he is shaking all over. He is walking around fine. Nothing else is bothering him. Does the patient have any new or worsening symptoms? ---Yes Will a triage be completed? ---Yes Related visit to physician within the last 2 weeks? ---Yes Does the PT have any chronic conditions? (i.e. diabetes, asthma, etc.) ---Yes List chronic conditions. ---htn, borderline diabetic Is this a behavioral health or substance abuse call? ---No Guidelines Guideline Title Affirmed Question Affirmed Notes Final Disposition User Clinical Call Hensel, RN, Aeriel Comments His bp is down to 114/77. Nurse contacted back line in regards to possible medication reactions. Per Dr. Anitra Lauth nurse to inform caller to Dc amlodopine. Check his blood pressures. If he feels really bad go to the ed. Monitor BP twice a day. Nurse to also inform caller that if he is having weakness on one side he needs to go to the ED and get checked out. Nurse gave instructions per Dr. Anitra Lauth. Caller states, they are going to have lunch pt does not feel bad right now. If he still feels shakey after lunch they will go to the Ed and get checked out.

## 2016-08-15 NOTE — Telephone Encounter (Signed)
error 

## 2016-08-22 ENCOUNTER — Telehealth: Payer: Self-pay | Admitting: Emergency Medicine

## 2016-08-22 NOTE — Telephone Encounter (Signed)
OK, f/u 4 mo from now is fine.-thx

## 2016-08-22 NOTE — Telephone Encounter (Signed)
Patient's wife notified and verbalized understanding.  Appointment was rescheduled by Diane.

## 2016-08-22 NOTE — Telephone Encounter (Signed)
Pt called to inform Dr. Anitra Lauth that he purchased a new blood pressure cuff and it is accurate with the reading he got in the office. Pt would like to reschedule follow up appointment. Pt is scheduled for April 2 at 1030 am.

## 2016-08-25 ENCOUNTER — Encounter: Payer: Self-pay | Admitting: Family Medicine

## 2016-08-28 ENCOUNTER — Ambulatory Visit: Payer: Medicare Other | Admitting: Family Medicine

## 2016-08-29 ENCOUNTER — Encounter: Payer: Self-pay | Admitting: Family Medicine

## 2016-09-24 DIAGNOSIS — N401 Enlarged prostate with lower urinary tract symptoms: Secondary | ICD-10-CM | POA: Diagnosis not present

## 2016-09-24 DIAGNOSIS — R972 Elevated prostate specific antigen [PSA]: Secondary | ICD-10-CM | POA: Diagnosis not present

## 2016-09-24 DIAGNOSIS — R351 Nocturia: Secondary | ICD-10-CM | POA: Diagnosis not present

## 2016-09-24 DIAGNOSIS — N3080 Other cystitis without hematuria: Secondary | ICD-10-CM | POA: Diagnosis not present

## 2016-09-29 ENCOUNTER — Ambulatory Visit: Payer: Medicare Other | Admitting: Family Medicine

## 2016-10-01 ENCOUNTER — Encounter: Payer: Self-pay | Admitting: Family Medicine

## 2016-10-22 ENCOUNTER — Encounter: Payer: Self-pay | Admitting: Family Medicine

## 2016-11-18 ENCOUNTER — Other Ambulatory Visit: Payer: Self-pay | Admitting: Family Medicine

## 2016-11-18 NOTE — Telephone Encounter (Signed)
[facility] [address]  RF request for valsartan/hctz LOV: 07/21/16 Next ov: 12/19/16 Last written: 07/07/16 #90 w/ 1RF

## 2016-12-17 ENCOUNTER — Ambulatory Visit: Payer: Medicare Other | Admitting: Family Medicine

## 2016-12-19 ENCOUNTER — Ambulatory Visit (INDEPENDENT_AMBULATORY_CARE_PROVIDER_SITE_OTHER): Payer: Medicare Other | Admitting: Family Medicine

## 2016-12-19 ENCOUNTER — Ambulatory Visit: Payer: Medicare Other

## 2016-12-19 ENCOUNTER — Encounter: Payer: Self-pay | Admitting: Family Medicine

## 2016-12-19 VITALS — BP 158/91 | HR 58 | Temp 97.4°F | Resp 16 | Wt 194.0 lb

## 2016-12-19 DIAGNOSIS — I10 Essential (primary) hypertension: Secondary | ICD-10-CM

## 2016-12-19 DIAGNOSIS — N183 Chronic kidney disease, stage 3 unspecified: Secondary | ICD-10-CM

## 2016-12-19 LAB — BASIC METABOLIC PANEL
BUN: 36 mg/dL — ABNORMAL HIGH (ref 6–23)
CALCIUM: 9.7 mg/dL (ref 8.4–10.5)
CO2: 33 mEq/L — ABNORMAL HIGH (ref 19–32)
Chloride: 99 mEq/L (ref 96–112)
Creatinine, Ser: 2.62 mg/dL — ABNORMAL HIGH (ref 0.40–1.50)
GFR: 24.97 mL/min — AB (ref >60.00)
GLUCOSE: 86 mg/dL (ref 70–99)
Potassium: 4.7 mEq/L (ref 3.5–5.1)
SODIUM: 141 meq/L (ref 135–145)

## 2016-12-19 NOTE — Progress Notes (Signed)
OFFICE VISIT  12/19/2016   CC:  Chief Complaint  Patient presents with  . Hypertension    follow up    HPI:    Patient is a 83 y.o. Caucasian male who presents for f/u uncontrolled HTN in the setting of CRI stage III. Last visit I added amlodipine 10mg  qd.  He had some side effects from this (weakness, disequilibrium) and stopped it not long after starting it. Feeling well.   No CP, no SOB, no dizziness, no palpitations, no LE swelling. He continues to do all his yard maintenance.  HTN: New upper arm cuff--tested here today against our and it was fine. Avg bp at home over last 6 weeks: 130/75, P avg 60.  He is better from a prostate standpoint.  BPH with LUT obstructive sx's much improved with use of cardura.   Past Medical History:  Diagnosis Date  . 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 Dois Davenport (urol).  Dr. Jeffie Pollock suspects this was due to stretch injury and has resolved as of 09/24/16.  . Arthritis of right wrist    s/p fracture of middle third of scaphoid bone sustained cleaning up from [facility] injection by Dr. Napoleon Form in [place] (ortho) made the pain go away.  Marland Kitchen BPH with obstruction/lower urinary tract symptoms    hx of urinary retention (s/p foley cath when in hosp for urosepsis 04/2016).  TURP will not help since he has acontractile bladder.  Options are CIC, indwelling urethral foley, or suprapubic catheter.  Has urethral foley as of 07/2016.  Marland Kitchen Chronic low back pain   . Chronic renal insufficiency, stage III (moderate)    CrCl 30s (saw nephrologist in [place]). +microalbuminuria 04/2014 at PCP in [place]  . COPD (chronic obstructive pulmonary disease) ([place])    on CXR 09/27/2015  . Diverticulosis 04/2016   Noted on CT 04/2016  . Elevated PSA 06/2016   Dr. Redmond Pulling ([facility] urologist)-awaiting records (pt's wife reported pt's psa was 7).  Dr. Jeffie Pollock feels like this may have been a result of relatively recent episode of  urinary retention, plans recheck PSA 11/2016.  Marland Kitchen History of anemia due to chronic kidney disease   . History of pyelonephritis 04/2016  . History of sepsis 04/2016   e coli  . Hypertension   . Kidney stones    one episode  . Pre-diabetes    2015 HbA1c 6.2-6.3 %.  HbA1c 06/2016 5.4%.  . Recurrent UTI 2017  . 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 (04/2016 CT)    Past Surgical History:  Procedure Laterality Date  . TONSILLECTOMY  1940  . VENTRAL HERNIA REPAIR  2010   with strangulation of SB (approx 1 foot of SB had to be excised).  Had 3 total surgeries for this due to complications    Outpatient Medications Prior to Visit  Medication Sig Dispense Refill  . aspirin 81 MG tablet Take 81 mg by mouth daily.    Marland Kitchen doxazosin (CARDURA) 8 MG tablet Take 1 tablet (8 mg total) by mouth daily. 30 tablet 6  . finasteride (PROSCAR) 5 MG tablet Take 1 tablet (5 mg total) by mouth daily. 30 tablet 11  . metoprolol succinate (TOPROL-XL) 50 MG 24 hr tablet Take 1 tablet (50 mg total) by mouth daily. Take with or immediately following a meal. 90 tablet 1  . Multiple Vitamin (MULTIVITAMIN) tablet Take 1 tablet by mouth daily.    . valsartan-hydrochlorothiazide (DIOVAN-HCT) 320-25 MG  tablet Take 1 tablet by mouth daily. 90 tablet 1  . amLODipine (NORVASC) 10 MG tablet Take 1 tablet (10 mg total) by mouth daily. 30 tablet 6  . valsartan-hydrochlorothiazide (DIOVAN-HCT) 320-25 MG tablet TAKE ONE TABLET BY MOUTH ONCE DAILY (Patient not taking: Reported on 12/19/2016) 90 tablet 1   No facility-administered medications prior to visit.     No Known Allergies  ROS As per HPI  PE: Blood pressure (!) 158/91, pulse (!) 58, temperature 97.4 F (36.3 C), temperature source Oral, resp. rate 16, weight 194 lb (88 kg), SpO2 95 %. Gen: Alert, well appearing.  Patient is oriented to person, place, time, and situation. AFFECT: pleasant, lucid thought and speech. CV: RRR, no  m/r/g.   LUNGS: CTA bilat, nonlabored resps, good aeration in all lung fields. EXT: no clubbing, cyanosis, or edema.   LABS:    Chemistry      Component Value Date/Time   NA 140 07/21/2016 0931   K 4.5 07/21/2016 0931   CL 100 07/21/2016 0931   CO2 32 07/21/2016 0931   BUN 35 (H) 07/21/2016 0931   CREATININE 2.31 (H) 07/21/2016 0931      Component Value Date/Time   CALCIUM 9.5 07/21/2016 0931   ALKPHOS 68 12/20/2015 1023   AST 11 12/20/2015 1023   ALT 9 12/20/2015 1023   BILITOT 0.9 12/20/2015 1023       IMPRESSION AND PLAN:  1) HTN; The current medical regimen is effective;  continue present plan and medications. Intolerant of amlodipine.  2) CRI stage III: check lytes/cr today.  An After Visit Summary was printed and given to the patient.  FOLLOW UP: Return in about 6 months (around 06/20/2017) for routine chronic illness f/u.  Signed:  Crissie Sickles, MD           12/19/2016

## 2016-12-22 ENCOUNTER — Encounter: Payer: Self-pay | Admitting: *Deleted

## 2016-12-22 DIAGNOSIS — R972 Elevated prostate specific antigen [PSA]: Secondary | ICD-10-CM | POA: Diagnosis not present

## 2016-12-29 DIAGNOSIS — R351 Nocturia: Secondary | ICD-10-CM | POA: Diagnosis not present

## 2016-12-29 DIAGNOSIS — R972 Elevated prostate specific antigen [PSA]: Secondary | ICD-10-CM | POA: Diagnosis not present

## 2016-12-29 DIAGNOSIS — N401 Enlarged prostate with lower urinary tract symptoms: Secondary | ICD-10-CM | POA: Diagnosis not present

## 2017-01-12 ENCOUNTER — Encounter: Payer: Self-pay | Admitting: Family Medicine

## 2017-01-21 ENCOUNTER — Other Ambulatory Visit: Payer: Self-pay | Admitting: *Deleted

## 2017-01-21 MED ORDER — DOXAZOSIN MESYLATE 8 MG PO TABS: 8.0000 mg | ORAL_TABLET | Freq: Every day | ORAL | 0 refills | Status: DC

## 2017-01-21 NOTE — Telephone Encounter (Signed)
Gary Morgan  RF request for doxazosin LOV: 12/19/16 Next ov: 06/16/17 Last written: 07/21/16 #30 w/ 6RF  Note on request states that pt is going out of town and would like a refill for 60 days instead of 30 days.   Please advise. Thanks.

## 2017-02-08 ENCOUNTER — Other Ambulatory Visit: Payer: Self-pay | Admitting: Family Medicine

## 2017-02-09 NOTE — Telephone Encounter (Signed)
[facility]

## 2017-03-03 ENCOUNTER — Telehealth: Payer: Self-pay | Admitting: *Deleted

## 2017-03-03 MED ORDER — IRBESARTAN-HYDROCHLOROTHIAZIDE 150-12.5 MG PO TABS: 2.0000 | ORAL_TABLET | Freq: Every day | ORAL | 0 refills | Status: DC

## 2017-03-03 NOTE — Telephone Encounter (Signed)
I sent in rx for irbesartan/hctz 150/12.5mg , 2 tabs once daily.  1 mo supply, will rx more if he does better with swallowing this med.

## 2017-03-03 NOTE — Telephone Encounter (Signed)
Pts wife advised and voiced understanding, okay per DPR. 

## 2017-03-03 NOTE — Telephone Encounter (Signed)
Pts wife LMOM on 03/03/17 at 10:01am stating that pt is having trouble swallowing the valsartan/hctz tablet. She stated that its a big tablet and it gets stuck every time he takes it. She stated that Dr. Anitra Lauth had told them there is a smaller tablet pt can take. Please advise. Thanks.

## 2017-03-04 NOTE — Telephone Encounter (Signed)
Pts wife advised and voiced understanding, okay per DPR. 

## 2017-03-04 NOTE — Telephone Encounter (Signed)
He should take the irbesartan/hctz, not the valsartan/hctz.-thx

## 2017-03-04 NOTE — Telephone Encounter (Signed)
Pts wife called back wanting to make sure pt is suppose to be taking the irbesartan/hctz instead of the valsartan/hctz. I advised her that Dr. Anitra Lauth may have sent in the irbesartan/hctz because the valsartan has been recalled. I advised her that I would double check with Dr. Anitra Lauth. Please advise. Thanks.

## 2017-03-23 ENCOUNTER — Encounter: Payer: Self-pay | Admitting: Family Medicine

## 2017-03-23 ENCOUNTER — Ambulatory Visit (INDEPENDENT_AMBULATORY_CARE_PROVIDER_SITE_OTHER): Payer: Medicare Other | Admitting: Family Medicine

## 2017-03-23 VITALS — BP 189/81 | HR 58 | Temp 97.4°F | Resp 16 | Ht 66.5 in | Wt 199.8 lb

## 2017-03-23 DIAGNOSIS — S161XXA Strain of muscle, fascia and tendon at neck level, initial encounter: Secondary | ICD-10-CM

## 2017-03-23 DIAGNOSIS — Z23 Encounter for immunization: Secondary | ICD-10-CM

## 2017-03-23 DIAGNOSIS — I10 Essential (primary) hypertension: Secondary | ICD-10-CM | POA: Diagnosis not present

## 2017-03-23 MED ORDER — CYCLOBENZAPRINE HCL 5 MG PO TABS: ORAL_TABLET | ORAL | 1 refills | Status: DC

## 2017-03-23 NOTE — Patient Instructions (Addendum)
Apply a heating pad for 20 min TWICE per day. Massage the area gently as much as possible. Do range of motion exercises several times per day.  Cervical Strain and Sprain Rehab Ask your health care provider which exercises are safe for you. Do exercises exactly as told by your health care provider and adjust them as directed. It is normal to feel mild stretching, pulling, tightness, or discomfort as you do these exercises, but you should stop right away if you feel sudden pain or your pain gets worse.Do not begin these exercises until told by your health care provider. Stretching and range of motion exercises These exercises warm up your muscles and joints and improve the movement and flexibility of your neck. These exercises also help to relieve pain, numbness, and tingling. Exercise A: Cervical side bend  1. Using good posture, sit on a stable chair or stand up. 2. Without moving your shoulders, slowly tilt your left / right ear to your shoulder until you feel a stretch in your neck muscles. You should be looking straight ahead. 3. Hold for __________ seconds. 4. Repeat with the other side of your neck. Repeat __________ times. Complete this exercise __________ times a day. Exercise B: Cervical rotation  1. Using good posture, sit on a stable chair or stand up. 2. Slowly turn your head to the side as if you are looking over your left / right shoulder. ? Keep your eyes level with the ground. ? Stop when you feel a stretch along the side and the back of your neck. 3. Hold for __________ seconds. 4. Repeat this by turning to your other side. Repeat __________ times. Complete this exercise __________ times a day. Exercise C: Thoracic extension and pectoral stretch 1. Roll a towel or a small blanket so it is about 4 inches (10 cm) in diameter. 2. Lie down on your back on a firm surface. 3. Put the towel lengthwise, under your spine in the middle of your back. It should not be not under your  shoulder blades. The towel should line up with your spine from your middle back to your lower back. 4. Put your hands behind your head and let your elbows fall out to your sides. 5. Hold for __________ seconds. Repeat __________ times. Complete this exercise __________ times a day. Strengthening exercises These exercises build strength and endurance in your neck. Endurance is the ability to use your muscles for a long time, even after your muscles get tired. Exercise D: Upper cervical flexion, isometric 1. Lie on your back with a thin pillow behind your head and a small rolled-up towel under your neck. 2. Gently tuck your chin toward your chest and nod your head down to look toward your feet. Do not lift your head off the pillow. 3. Hold for __________ seconds. 4. Release the tension slowly. Relax your neck muscles completely before you repeat this exercise. Repeat __________ times. Complete this exercise __________ times a day. Exercise E: Cervical extension, isometric  1. Stand about 6 inches (15 cm) away from a wall, with your back facing the wall. 2. Place a soft object, about 6-8 inches (15-20 cm) in diameter, between the back of your head and the wall. A soft object could be a small pillow, a ball, or a folded towel. 3. Gently tilt your head back and press into the soft object. Keep your jaw and forehead relaxed. 4. Hold for __________ seconds. 5. Release the tension slowly. Relax your neck muscles completely before  you repeat this exercise. Repeat __________ times. Complete this exercise __________ times a day. Posture and body mechanics  Body mechanics refers to the movements and positions of your body while you do your daily activities. Posture is part of body mechanics. Good posture and healthy body mechanics can help to relieve stress in your body's tissues and joints. Good posture means that your spine is in its natural S-curve position (your spine is neutral), your shoulders are  pulled back slightly, and your head is not tipped forward. The following are general guidelines for applying improved posture and body mechanics to your everyday activities. Standing  When standing, keep your spine neutral and keep your feet about hip-width apart. Keep a slight bend in your knees. Your ears, shoulders, and hips should line up.  When you do a task in which you stand in one place for a long time, place one foot up on a stable object that is 2-4 inches (5-10 cm) high, such as a footstool. This helps keep your spine neutral. Sitting   When sitting, keep your spine neutral and your keep feet flat on the floor. Use a footrest, if necessary, and keep your thighs parallel to the floor. Avoid rounding your shoulders, and avoid tilting your head forward.  When working at a desk or a computer, keep your desk at a height where your hands are slightly lower than your elbows. Slide your chair under your desk so you are close enough to maintain good posture.  When working at a computer, place your monitor at a height where you are looking straight ahead and you do not have to tilt your head forward or downward to look at the screen. Resting When lying down and resting, avoid positions that are most painful for you. Try to support your neck in a neutral position. You can use a contour pillow or a small rolled-up towel. Your pillow should support your neck but not push on it. This information is not intended to replace advice given to you by your health care provider. Make sure you discuss any questions you have with your health care provider. Document Released: 06/16/2005 Document Revised: 02/21/2016 Document Reviewed: 05/23/2015 Elsevier Interactive Patient Education  [employer].

## 2017-03-23 NOTE — Progress Notes (Signed)
OFFICE VISIT  03/23/2017   CC:  Chief Complaint  Patient presents with  . Neck Pain   HPI:    Patient is a 83 y.o.  male who presents for neck complaint.  Onset of pain in neck about 2-3 weeks ago, initially in middle of night.  Goes away all day, then upon waking up in morning he has it again.  Felt nausea assoc with pain recently.  No vomiting.   Has been taking 1000mg  tylenol last 3-4 days and this helps.   No radiation of the pain, no paresthesias.  Home bp monitoring: 140s/70-80 avg.  Nothing higher than 150s, nothing lower than 115.  Some clear nasal drainage chronically---allergic rhinitis.  Past Medical History:  Diagnosis Date  . 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 Dois Davenport (urol).  Dr. Jeffie Pollock suspects this was due to stretch injury and has resolved as of 09/24/16.  . Arthritis of right wrist    s/p fracture of middle third of scaphoid bone sustained cleaning up from [facility] injection by Dr. Napoleon Form in [place] (ortho) made the pain go away.  Marland Kitchen BPH with obstruction/lower urinary tract symptoms    hx of urinary retention (s/p foley cath when in hosp for urosepsis 04/2016).  TURP will not help since he has acontractile bladder.  Options are CIC, indwelling urethral foley, or suprapubic catheter.  Has urethral foley as of 07/2016.  Marland Kitchen Chronic low back pain   . Chronic renal insufficiency, stage III (moderate)    CrCl 30s (saw nephrologist in [place]). +microalbuminuria 04/2014 at PCP in [place]  . COPD (chronic obstructive pulmonary disease) ([place])    on CXR 09/27/2015  . Diverticulosis 04/2016   Noted on CT 04/2016  . Elevated PSA 06/2016   Dr. Redmond Pulling ([facility] urologist)-awaiting records (pt's wife reported pt's psa was 7).  Dr. Jeffie Pollock feels like this may have been a result of relatively recent episode of urinary retention, recheck PSA 12/29/16 was lower: 4.64, 24% f/t ratio, plan recheck 1 yr (Dr. Jeffie Pollock).   . History of anemia  due to chronic kidney disease   . History of pyelonephritis 04/2016  . History of sepsis 04/2016   e coli  . Hypertension   . Kidney stones    one episode  . Pre-diabetes    2015 HbA1c 6.2-6.3 %.  HbA1c 06/2016 5.4%.  . Recurrent UTI 2017  . 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 (04/2016 CT)    Past Surgical History:  Procedure Laterality Date  . TONSILLECTOMY  1940  . VENTRAL HERNIA REPAIR  2010   with strangulation of SB (approx 1 foot of SB had to be excised).  Had 3 total surgeries for this due to complications    Outpatient Medications Prior to Visit  Medication Sig Dispense Refill  . aspirin 81 MG tablet Take 81 mg by mouth daily.    Marland Kitchen doxazosin (CARDURA) 8 MG tablet Take 1 tablet (8 mg total) by mouth daily. 60 tablet 0  . finasteride (PROSCAR) 5 MG tablet Take 1 tablet (5 mg total) by mouth daily. 30 tablet 11  . irbesartan-hydrochlorothiazide (AVALIDE) 150-12.5 MG tablet Take 2 tablets by mouth daily. 60 tablet 0  . metoprolol succinate (TOPROL-XL) 50 MG 24 hr tablet TAKE ONE TABLET BY MOUTH ONCE DAILY WITH  OR  IMMEDIATELY  FOLLOWING  A  MEAL 90 tablet 1  . Multiple Vitamin (MULTIVITAMIN) tablet Take 1 tablet by mouth daily.    Marland Kitchen  vitamin B-12 (CYANOCOBALAMIN) 1000 MCG tablet Take 1,000 mcg by mouth every other day.    . valsartan-hydrochlorothiazide (DIOVAN-HCT) 320-25 MG tablet Take 1 tablet by mouth daily. (Patient not taking: Reported on 03/23/2017) 90 tablet 1   No facility-administered medications prior to visit.     Allergies  Allergen Reactions  . Amlodipine Other (See Comments)    Disequilibrium, generalized weakness.    ROS As per HPI  PE: Blood pressure (!) 189/81, pulse (!) 58, temperature (!) 97.4 F (36.3 C), temperature source Oral, resp. rate 16, height 5' 6.5" (1.689 m), weight 199 lb 12 oz (90.6 kg), SpO2 96 %. Gen: Alert, well appearing.  Patient is oriented to person, place, time, and situation. AFFECT:  pleasant, lucid thought and speech. Neck: ROM intact flexion and extension, rotation.  Significantly limited ROM on lateral bending--"stiff". TTP in soft tissue region behind R SCM muscle bundle.  No mass. UE strength 5/5 prox/dist bilat.  LABS:     Chemistry      Component Value Date/Time   NA 141 12/19/2016 1047   K 4.7 12/19/2016 1047   CL 99 12/19/2016 1047   CO2 33 (H) 12/19/2016 1047   BUN 36 (H) 12/19/2016 1047   CREATININE 2.62 (H) 12/19/2016 1047      Component Value Date/Time   CALCIUM 9.7 12/19/2016 1047   ALKPHOS 68 12/20/2015 1023   AST 11 12/20/2015 1023   ALT 9 12/20/2015 1023   BILITOT 0.9 12/20/2015 1023     GFR 12/19/16= 25 ml/min  IMPRESSION AND PLAN:  1) Neck musculoskeletal strain/sprain: Instructions:  Apply a heating pad for 20 min TWICE per day. Massage the area gently as much as possible. Do range of motion exercises several times per day. Flexeril 5mg , 1 tab po bid prn. Continue tylenol 1000 mg q6h prn.  2) HTN: fairly well controlled at home.  Continue home bp monitoring. No med changes today.  Flu vaccine given to pt today.  An After Visit Summary was printed and given to the patient.  FOLLOW UP: Return if symptoms worsen or fail to improve in 10-14 days. Neck x-ray and PT referral will be next step if not improved in 10-14d.  Signed:  Crissie Sickles, MD           03/23/2017

## 2017-03-23 NOTE — Addendum Note (Signed)
Addended by: Gordy Councilman on: 03/23/2017 02:43 PM   Modules accepted: Orders

## 2017-03-31 ENCOUNTER — Other Ambulatory Visit: Payer: Self-pay | Admitting: Family Medicine

## 2017-04-03 ENCOUNTER — Encounter: Payer: Self-pay | Admitting: Family Medicine

## 2017-04-04 ENCOUNTER — Other Ambulatory Visit: Payer: Self-pay | Admitting: Family Medicine

## 2017-04-06 ENCOUNTER — Other Ambulatory Visit: Payer: Self-pay | Admitting: Family Medicine

## 2017-04-06 MED ORDER — IRBESARTAN-HYDROCHLOROTHIAZIDE 150-12.5 MG PO TABS: 2.0000 | ORAL_TABLET | Freq: Every day | ORAL | 2 refills | Status: DC

## 2017-04-06 NOTE — Telephone Encounter (Signed)
Patient states the Rx is not at the pharmacy. Patient is out of medication.

## 2017-04-06 NOTE — Telephone Encounter (Signed)
Left message on [place] phone to refill RX as written.

## 2017-04-06 NOTE — Telephone Encounter (Signed)
Received MyChart message requesting refill of irbesartan-hctz.  I tried to escribe RX without success.  I will call into [facility].

## 2017-04-26 ENCOUNTER — Other Ambulatory Visit: Payer: Self-pay | Admitting: Family Medicine

## 2017-04-26 ENCOUNTER — Encounter: Payer: Self-pay | Admitting: Family Medicine

## 2017-04-27 MED ORDER — DOXAZOSIN MESYLATE 8 MG PO TABS: 8.0000 mg | ORAL_TABLET | Freq: Every day | ORAL | 1 refills | Status: DC

## 2017-04-27 MED ORDER — FINASTERIDE 5 MG PO TABS: 5.0000 mg | ORAL_TABLET | Freq: Every day | ORAL | 1 refills | Status: DC

## 2017-06-15 NOTE — Progress Notes (Signed)
Subjective:   Gary Morgan is a 83 y.o. male who presents for an Initial Medicare Annual Wellness Visit.  Review of Systems  No ROS.  Medicare Wellness Visit. Additional risk factors are reflected in the social history.  Cardiac Risk Factors include: advanced age (>18men, >19 women);male gender;hypertension;obesity (BMI >30kg/m2)   Sleep patterns: Sleeps 8 hours.  Home Safety/Smoke Alarms: Feels safe in home. Smoke alarms in place.  Living environment; residence and Firearm Safety: Lives with wife in 1 story home.  Seat Belt Safety/Bike Helmet: Wears seat belt.   Male:   CCS-None.      PSA-Followed by Urology  Lab Results  Component Value Date   PSA 7.0 07/11/2016      Objective:    Today's Vitals   06/16/17 0945  BP: (!) 154/91  Pulse: (!) 57  Resp: 16  Temp: (!) 97.5 F (36.4 C)  TempSrc: Oral  SpO2: 95%  Weight: 195 lb 8 oz (88.7 kg)  Height: 5' 6.5" (1.689 m)   Body mass index is 31.08 kg/m.  Advanced Directives 06/16/2017  Does Patient Have a Medical Advance Directive? Yes  Type of Paramedic of [place];Living will  Copy of [place] in Chart? Yes    Current Medications (verified) Outpatient Encounter Medications as of 06/16/2017  Medication Sig  . aspirin 81 MG tablet Take 81 mg by mouth daily.  Marland Kitchen doxazosin (CARDURA) 8 MG tablet Take 1 tablet (8 mg total) by mouth daily.  . finasteride (PROSCAR) 5 MG tablet Take 1 tablet (5 mg total) by mouth daily.  . irbesartan-hydrochlorothiazide (AVALIDE) 150-12.5 MG tablet Take 2 tablets by mouth daily.  . metoprolol succinate (TOPROL-XL) 50 MG 24 hr tablet TAKE ONE TABLET BY MOUTH ONCE DAILY WITH  OR  IMMEDIATELY  FOLLOWING  A  MEAL  . Multiple Vitamin (MULTIVITAMIN) tablet Take 1 tablet by mouth daily.  . vitamin B-12 (CYANOCOBALAMIN) 1000 MCG tablet Take 1,000 mcg by mouth every other day.  . [DISCONTINUED] irbesartan-hydrochlorothiazide (AVALIDE) 150-12.5 MG  tablet Take 2 tablets by mouth daily.  . [DISCONTINUED] metoprolol succinate (TOPROL-XL) 50 MG 24 hr tablet TAKE ONE TABLET BY MOUTH ONCE DAILY WITH  OR  IMMEDIATELY  FOLLOWING  A  MEAL  . [DISCONTINUED] cyclobenzaprine (FLEXERIL) 5 MG tablet 1 tab po bid prn neck pain (Patient not taking: Reported on 06/16/2017)   No facility-administered encounter medications on file as of 06/16/2017.     Allergies (verified) Amlodipine   History: Past Medical History:  Diagnosis Date  . 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 Dois Davenport (urol).  Dr. Jeffie Pollock suspects this was due to stretch injury and has resolved as of 09/24/16.  . Arthritis of right wrist    s/p fracture of middle third of scaphoid bone sustained cleaning up from [facility] injection by Dr. Napoleon Form in [place] (ortho) made the pain go away.  Marland Kitchen BPH with obstruction/lower urinary tract symptoms    hx of urinary retention (s/p foley cath when in hosp for urosepsis 04/2016).  TURP will not help since he has acontractile bladder.  Options are CIC, indwelling urethral foley, or suprapubic catheter.  Has urethral foley as of 07/2016.  Marland Kitchen Chronic low back pain   . Chronic renal insufficiency, stage III (moderate) (HCC)    CrCl 30s (saw nephrologist in [place]). +microalbuminuria 04/2014 at PCP in [place]  . COPD (chronic obstructive pulmonary disease) ([place])    on CXR 09/27/2015  . Diverticulosis 04/2016  Noted on CT 04/2016  . Elevated PSA 06/2016   Dr. Redmond Pulling ([facility] urologist)-awaiting records (pt's wife reported pt's psa was 7).  Dr. Jeffie Pollock feels like this may have been a result of relatively recent episode of urinary retention, recheck PSA 12/29/16 was lower: 4.64, 24% f/t ratio, plan recheck 1 yr (Dr. Jeffie Pollock).   . History of anemia due to chronic kidney disease   . History of pyelonephritis 04/2016  . History of sepsis 04/2016   e coli  . Hypertension   . Kidney stones    one episode  .  Pre-diabetes    2015 HbA1c 6.2-6.3 %.  HbA1c 06/2016 5.4%.  . Recurrent UTI 2017  . 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 (04/2016 CT)   Past Surgical History:  Procedure Laterality Date  . TONSILLECTOMY  1940  . VENTRAL HERNIA REPAIR  2010   with strangulation of SB (approx 1 foot of SB had to be excised).  Had 3 total surgeries for this due to complications   Family History  Problem Relation Age of Onset  . Breast cancer Mother   . Diabetes Mother    Social History   Socioeconomic History  . Marital status: Married    Spouse name: None  . Number of children: None  . Years of education: None  . Highest education level: None  Social Needs  . Financial resource strain: None  . Food insecurity - worry: None  . Food insecurity - inability: None  . Transportation needs - medical: None  . Transportation needs - non-medical: None  Occupational History  . None  Tobacco Use  . Smoking status: Former Smoker    Packs/day: 0.25    Years: 5.00    Pack years: 1.25    Types: Cigarettes    Last attempt to quit: 06/30/1978    Years since quitting: 38.9  . Smokeless tobacco: Never Used  Substance and Sexual Activity  . Alcohol use: No  . Drug use: No  . Sexual activity: None  Other Topics Concern  . None  Social History Narrative   Married, has 2 step daughters and 3 biologic children.  Has some grandchildren.   Relocated from [place] to [facility] 08/2013.   Retired [employer].   No T/A/Ds.   Tobacco Counseling Counseling given: Not Answered    Activities of Daily Living In your present state of health, do you have any difficulty performing the following activities: 06/16/2017  Hearing? N  Vision? N  Difficulty concentrating or making decisions? N  Walking or climbing stairs? N  Dressing or bathing? N  Doing errands, shopping? N  Preparing Food and eating ? N  Using the Toilet? N  In the past six months, have you accidently  leaked urine? N  Do you have problems with loss of bowel control? N  Managing your Medications? N  Managing your Finances? N  Housekeeping or managing your Housekeeping? N  Some recent data might be hidden     Immunizations and Health Maintenance Immunization History  Administered Date(s) Administered  . Influenza, High Dose Seasonal PF 04/25/2015, 03/23/2017  . Influenza-Unspecified 03/14/2016  . Pneumococcal Conjugate-13 06/06/2014  . Pneumococcal Polysaccharide-23 03/25/2016  . Td 07/01/2007, 05/04/2016  . Zoster 06/06/2014   There are no preventive care reminders to display for this patient.  Patient Care Team: Tammi Sou, MD as PCP - General (Family Medicine) Irine Seal, MD as Consulting Physician (Urology)  Indicate any recent  Medical Services you may have received from other than [facility] providers in the past year (date may be approximate).    Assessment:   This is a routine wellness examination for Gary Morgan.  Hearing/Vision screen Hearing Screening Comments: Able to hear conversational tones w/o difficulty. No issues reported.   Vision Screening Comments: Last exam 2013. H/O cataract surgery.   Dietary issues and exercise activities discussed: Exercise limited by: None identified   Diet (meal preparation, eat out, water intake, caffeinated beverages, dairy products, fruits and vegetables): Drinks water.   Eats heart healthy diet, at least 3 meals/day  Goals    . Weight (lb) < 200 lb (90.7 kg)     Lose 10 pounds by walking and cutting back on calories.       Depression Screen PHQ 2/9 Scores 06/16/2017 07/07/2016 11/30/2014  PHQ - 2 Score 0 0 0    Fall Risk Fall Risk  06/16/2017 07/07/2016 11/30/2014  Falls in the past year? No Yes No  Number falls in past yr: - 1 -  Injury with Fall? - No -  Follow up - Falls evaluation completed;Education provided;Falls prevention discussed -     Cognitive Function:       Ad8 score reviewed for issues:  Issues  making decisions: no  Less interest in hobbies / activities: no  Repeats questions, stories (family complaining): no  Trouble using ordinary gadgets (microwave, computer, phone): no  Forgets the month or year: no  Mismanaging finances: no  Remembering appts: no  Daily problems with thinking and/or memory: no Ad8 score is=0     Screening Tests Health Maintenance  Topic Date Due  . TETANUS/TDAP  05/04/2026  . INFLUENZA VACCINE  Completed  . PNA vac Low Risk Adult  Completed   Declines Shingrix       Plan:    Continue doing brain stimulating activities (puzzles, reading, adult coloring books, staying active) to keep memory sharp.   I have personally reviewed and noted the following in the patient's chart:   . Medical and social history . Use of alcohol, tobacco or illicit drugs  . Current medications and supplements . Functional ability and status . Nutritional status . Physical activity . Advanced directives . List of other physicians . Hospitalizations, surgeries, and ER visits in previous 12 months . Vitals . Screenings to include cognitive, depression, and falls . Referrals and appointments  In addition, I have reviewed and discussed with patient certain preventive protocols, quality metrics, and best practice recommendations. A written personalized care plan for preventive services as well as general preventive health recommendations were provided to patient.     Gerilyn Nestle, RN   06/16/2017

## 2017-06-16 ENCOUNTER — Other Ambulatory Visit: Payer: Self-pay

## 2017-06-16 ENCOUNTER — Encounter: Payer: Self-pay | Admitting: Family Medicine

## 2017-06-16 ENCOUNTER — Ambulatory Visit (INDEPENDENT_AMBULATORY_CARE_PROVIDER_SITE_OTHER): Payer: Medicare Other | Admitting: Family Medicine

## 2017-06-16 VITALS — BP 154/91 | HR 57 | Temp 97.5°F | Resp 16 | Ht 66.5 in | Wt 195.5 lb

## 2017-06-16 DIAGNOSIS — Z862 Personal history of diseases of the blood and blood-forming organs and certain disorders involving the immune mechanism: Secondary | ICD-10-CM | POA: Diagnosis not present

## 2017-06-16 DIAGNOSIS — N183 Chronic kidney disease, stage 3 unspecified: Secondary | ICD-10-CM

## 2017-06-16 DIAGNOSIS — I10 Essential (primary) hypertension: Secondary | ICD-10-CM

## 2017-06-16 DIAGNOSIS — R7303 Prediabetes: Secondary | ICD-10-CM | POA: Diagnosis not present

## 2017-06-16 DIAGNOSIS — N138 Other obstructive and reflux uropathy: Secondary | ICD-10-CM | POA: Diagnosis not present

## 2017-06-16 DIAGNOSIS — D649 Anemia, unspecified: Secondary | ICD-10-CM | POA: Diagnosis not present

## 2017-06-16 DIAGNOSIS — Z0000 Encounter for general adult medical examination without abnormal findings: Secondary | ICD-10-CM | POA: Diagnosis not present

## 2017-06-16 DIAGNOSIS — N401 Enlarged prostate with lower urinary tract symptoms: Secondary | ICD-10-CM

## 2017-06-16 LAB — CBC WITH DIFFERENTIAL/PLATELET
BASOS PCT: 0.4 % (ref 0.0–3.0)
Basophils Absolute: 0 10*3/uL (ref 0.0–0.1)
EOS ABS: 0.4 10*3/uL (ref 0.0–0.7)
Eosinophils Relative: 4.6 % (ref 0.0–5.0)
HEMATOCRIT: 44.7 % (ref 39.0–52.0)
Hemoglobin: 14.7 g/dL (ref 13.0–17.0)
LYMPHS ABS: 2.1 10*3/uL (ref 0.7–4.0)
Lymphocytes Relative: 26.3 % (ref 12.0–46.0)
MCHC: 33 g/dL (ref 30.0–36.0)
MCV: 95.7 fl (ref 78.0–100.0)
MONO ABS: 0.4 10*3/uL (ref 0.1–1.0)
Monocytes Relative: 5.4 % (ref 3.0–12.0)
NEUTROS ABS: 5 10*3/uL (ref 1.4–7.7)
Neutrophils Relative %: 63.3 % (ref 43.0–77.0)
Platelets: 157 10*3/uL (ref 150.0–400.0)
RBC: 4.67 Mil/uL (ref 4.22–5.81)
RDW: 13.3 % (ref 11.5–15.5)
WBC: 7.9 10*3/uL (ref 4.0–10.5)

## 2017-06-16 LAB — BASIC METABOLIC PANEL
BUN: 35 mg/dL — AB (ref 6–23)
CALCIUM: 9.6 mg/dL (ref 8.4–10.5)
CHLORIDE: 101 meq/L (ref 96–112)
CO2: 34 meq/L — AB (ref 19–32)
CREATININE: 2.55 mg/dL — AB (ref 0.40–1.50)
GFR: 25.73 mL/min — ABNORMAL LOW (ref >60.00)
GLUCOSE: 95 mg/dL (ref 70–99)
Potassium: 4.9 mEq/L (ref 3.5–5.1)
Sodium: 142 mEq/L (ref 135–145)

## 2017-06-16 LAB — HEMOGLOBIN A1C: Hgb A1c MFr Bld: 6.5 % (ref 4.6–6.5)

## 2017-06-16 MED ORDER — IRBESARTAN-HYDROCHLOROTHIAZIDE 150-12.5 MG PO TABS: 2.0000 | ORAL_TABLET | Freq: Every day | ORAL | 1 refills | Status: DC

## 2017-06-16 MED ORDER — METOPROLOL SUCCINATE ER 50 MG PO TB24: ORAL_TABLET | ORAL | 1 refills | Status: DC

## 2017-06-16 NOTE — Progress Notes (Signed)
OFFICE VISIT  06/16/2017   CC:  Chief Complaint  Patient presents with  . Follow-up    RCI, pt is fasting.   . Medicare Wellness   HPI:    Patient is a 83 y.o.  male who presents for f/u HTN, CRI 3, prediabetes.    Feeling pretty good lately.  No acute complaints.  Of note, he has hx of urosepsis fall 2017 and had leukocytosis and mild normocytic anemia during hospitalization. It is not clear from pt report and EMR records that these labs were repeated.  HTN: Daily bp monitoring--avg high 140s/80s.  Avg HR 56-60. In discussion today he does not want to add any additional bp med, wants to continue trying TLC.  CRI: avoids NSAIDs.  Hydrates well.  Prediabetes: Hb as high as 6.3% in the past. Not eating diabetic or low chol/low cal diet.  Says he doesn't eat as much as he used to. Walking daily up and down driveway, using cane mostly but going without more.  Goes to see Dr. Jeffie Pollock 06/2017 for routine f/u. Has some chronic hesitancy but MUCH better emptying and stronger stream since getting on finasteride. No self-cathing anymore at all.  Past Medical History:  Diagnosis Date  . 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 Dois Davenport (urol).  Dr. Jeffie Pollock suspects this was due to stretch injury and has resolved as of 09/24/16.  . Arthritis of right wrist    s/p fracture of middle third of scaphoid bone sustained cleaning up from [facility] injection by Dr. Napoleon Form in [place] (ortho) made the pain go away.  Marland Kitchen BPH with obstruction/lower urinary tract symptoms    hx of urinary retention (s/p foley cath when in hosp for urosepsis 04/2016).  TURP will not help since he has acontractile bladder.  Options are CIC, indwelling urethral foley, or suprapubic catheter.  Has urethral foley as of 07/2016.  Marland Kitchen Chronic low back pain   . Chronic renal insufficiency, stage III (moderate) (HCC)    CrCl 30s (saw nephrologist in [place]). +microalbuminuria 04/2014 at  PCP in [place]  . COPD (chronic obstructive pulmonary disease) ([place])    on CXR 09/27/2015  . Diverticulosis 04/2016   Noted on CT 04/2016  . Elevated PSA 06/2016   Dr. Redmond Pulling ([facility] urologist)-awaiting records (pt's wife reported pt's psa was 7).  Dr. Jeffie Pollock feels like this may have been a result of relatively recent episode of urinary retention, recheck PSA 12/29/16 was lower: 4.64, 24% f/t ratio, plan recheck 1 yr (Dr. Jeffie Pollock).   . History of anemia due to chronic kidney disease   . History of pyelonephritis 04/2016  . History of sepsis 04/2016   e coli  . Hypertension   . Kidney stones    one episode  . Pre-diabetes    2015 HbA1c 6.2-6.3 %.  HbA1c 06/2016 5.4%.  . Recurrent UTI 2017  . 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 (04/2016 CT)    Past Surgical History:  Procedure Laterality Date  . TONSILLECTOMY  1940  . VENTRAL HERNIA REPAIR  2010   with strangulation of SB (approx 1 foot of SB had to be excised).  Had 3 total surgeries for this due to complications    Outpatient Medications Prior to Visit  Medication Sig Dispense Refill  . aspirin 81 MG tablet Take 81 mg by mouth daily.    Marland Kitchen doxazosin (CARDURA) 8 MG tablet Take 1 tablet (8 mg total) by mouth  daily. 90 tablet 1  . finasteride (PROSCAR) 5 MG tablet Take 1 tablet (5 mg total) by mouth daily. 90 tablet 1  . Multiple Vitamin (MULTIVITAMIN) tablet Take 1 tablet by mouth daily.    . vitamin B-12 (CYANOCOBALAMIN) 1000 MCG tablet Take 1,000 mcg by mouth every other day.    . irbesartan-hydrochlorothiazide (AVALIDE) 150-12.5 MG tablet Take 2 tablets by mouth daily. 60 tablet 2  . metoprolol succinate (TOPROL-XL) 50 MG 24 hr tablet TAKE ONE TABLET BY MOUTH ONCE DAILY WITH  OR  IMMEDIATELY  FOLLOWING  A  MEAL 90 tablet 1  . cyclobenzaprine (FLEXERIL) 5 MG tablet 1 tab po bid prn neck pain (Patient not taking: Reported on 06/16/2017) 30 tablet 1   No facility-administered medications prior to  visit.     Allergies  Allergen Reactions  . Amlodipine Other (See Comments)    Disequilibrium, generalized weakness.    ROS As per HPI  PE: Blood pressure (!) 154/91, pulse (!) 57, temperature (!) 97.5 F (36.4 C), temperature source Oral, resp. rate 16, height 5' 6.5" (1.689 m), weight 195 lb 8 oz (88.7 kg), SpO2 95 %. Gen: Alert, well appearing.  Patient is oriented to person, place, time, and situation. AFFECT: pleasant, lucid thought and speech. CV: RRR, no m/r/g.   LUNGS: CTA bilat, nonlabored resps, good aeration in all lung fields. EXT: no clubbing, cyanosis, or edema.   LABS:  No results found for: TSH Lab Results  Component Value Date   WBC 11.7 (H) 05/14/2016   HGB 12.1 Repeated and verified X2. (L) 05/14/2016   HCT 36.4 (L) 05/14/2016   MCV 91.7 05/14/2016   PLT 375.0 05/14/2016   Lab Results  Component Value Date   CREATININE 2.62 (H) 12/19/2016   BUN 36 (H) 12/19/2016   NA 141 12/19/2016   K 4.7 12/19/2016   CL 99 12/19/2016   CO2 33 (H) 12/19/2016   Lab Results  Component Value Date   ALT 9 12/20/2015   AST 11 12/20/2015   ALKPHOS 68 12/20/2015   BILITOT 0.9 12/20/2015   No results found for: CHOL No results found for: HDL No results found for: LDLCALC No results found for: TRIG No results found for: CHOLHDL Lab Results  Component Value Date   PSA 7.0 07/11/2016   Lab Results  Component Value Date   HGBA1C 5.4 07/07/2016   IMPRESSION AND PLAN:  1) HTN: not ideal control.  Pt declines any additional med at this time.  Really wants to try more diet/exercise/wt loss. Goal 130s/80s.   RF'd toprol xl and irbesart/hct today. Lytes/cr today.  2) CRI stage III: due to combo of HTN and obstructive uropathy. He avoids NSAIDs and hydrates well. Lytes/cr today.  3) Prediabetes: not really eating diabetic diet but seems to have cut back on portion size and is exercising a bit more--has lost 4 lbs since last visit. Recheck HbA1c today.  4) BPH  with LUTS: much improved on proscar.  Continue proscar and cardura. He'll keep routine f/u visit with Dr. Jeffie Pollock 06/2017.  4) Hx of mild leukocytosis and normocytic anemia: this was around/during the time when he was hospitalized for urosepsis. Recheck CBC w/diff today.  An After Visit Summary was printed and given to the patient.  FOLLOW UP: Return in about 3 months (around 09/14/2017) for routine chronic illness f/u.  Signed:  Crissie Sickles, MD           06/16/2017

## 2017-06-16 NOTE — Patient Instructions (Signed)
Continue doing brain stimulating activities (puzzles, reading, adult coloring books, staying active) to keep memory sharp.   Health Maintenance, Male A healthy lifestyle and preventive care is important for your health and wellness. Ask your health care provider about what schedule of regular examinations is right for you. What should I know about weight and diet? Eat a Healthy Diet  Eat plenty of vegetables, fruits, whole grains, low-fat dairy products, and lean protein.  Do not eat a lot of foods high in solid fats, added sugars, or salt.  Maintain a Healthy Weight Regular exercise can help you achieve or maintain a healthy weight. You should:  Do at least 150 minutes of exercise each week. The exercise should increase your heart rate and make you sweat (moderate-intensity exercise).  Do strength-training exercises at least twice a week.  Watch Your Levels of Cholesterol and Blood Lipids  Have your blood tested for lipids and cholesterol every 5 years starting at 81 years of age. If you are at high risk for heart disease, you should start having your blood tested when you are 81 years old. You may need to have your cholesterol levels checked more often if: ? Your lipid or cholesterol levels are high. ? You are older than 81 years of age. ? You are at high risk for heart disease.  What should I know about cancer screening? Many types of cancers can be detected early and may often be prevented. Lung Cancer  You should be screened every year for lung cancer if: ? You are a current smoker who has smoked for at least 30 years. ? You are a former smoker who has quit within the past 15 years.  Talk to your health care provider about your screening options, when you should start screening, and how often you should be screened.  Colorectal Cancer  Routine colorectal cancer screening usually begins at 81 years of age and should be repeated every 5-10 years until you are 81 years old. You  may need to be screened more often if early forms of precancerous polyps or small growths are found. Your health care provider may recommend screening at an earlier age if you have risk factors for colon cancer.  Your health care provider may recommend using home test kits to check for hidden blood in the stool.  A small camera at the end of a tube can be used to examine your colon (sigmoidoscopy or colonoscopy). This checks for the earliest forms of colorectal cancer.  Prostate and Testicular Cancer  Depending on your age and overall health, your health care provider may do certain tests to screen for prostate and testicular cancer.  Talk to your health care provider about any symptoms or concerns you have about testicular or prostate cancer.  Skin Cancer  Check your skin from head to toe regularly.  Tell your health care provider about any new moles or changes in moles, especially if: ? There is a change in a mole's size, shape, or color. ? You have a mole that is larger than a pencil eraser.  Always use sunscreen. Apply sunscreen liberally and repeat throughout the day.  Protect yourself by wearing long sleeves, pants, a wide-brimmed hat, and sunglasses when outside.  What should I know about heart disease, diabetes, and high blood pressure?  If you are 18-39 years of age, have your blood pressure checked every 3-5 years. If you are 40 years of age or older, have your blood pressure checked every year. You   should have your blood pressure measured twice-once when you are at a hospital or clinic, and once when you are not at a hospital or clinic. Record the average of the two measurements. To check your blood pressure when you are not at a hospital or clinic, you can use: ? An automated blood pressure machine at a pharmacy. ? A home blood pressure monitor.  Talk to your health care provider about your target blood pressure.  If you are between 45-79 years old, ask your health care  provider if you should take aspirin to prevent heart disease.  Have regular diabetes screenings by checking your fasting blood sugar level. ? If you are at a normal weight and have a low risk for diabetes, have this test once every three years after the age of 45. ? If you are overweight and have a high risk for diabetes, consider being tested at a younger age or more often.  A one-time screening for abdominal aortic aneurysm (AAA) by ultrasound is recommended for men aged 65-75 years who are current or former smokers. What should I know about preventing infection? Hepatitis B If you have a higher risk for hepatitis B, you should be screened for this virus. Talk with your health care provider to find out if you are at risk for hepatitis B infection. Hepatitis C Blood testing is recommended for:  Everyone born from 1945 through 1965.  Anyone with known risk factors for hepatitis C.  Sexually Transmitted Diseases (STDs)  You should be screened each year for STDs including gonorrhea and chlamydia if: ? You are sexually active and are younger than 81 years of age. ? You are older than 81 years of age and your health care provider tells you that you are at risk for this type of infection. ? Your sexual activity has changed since you were last screened and you are at an increased risk for chlamydia or gonorrhea. Ask your health care provider if you are at risk.  Talk with your health care provider about whether you are at high risk of being infected with HIV. Your health care provider may recommend a prescription medicine to help prevent HIV infection.  What else can I do?  Schedule regular health, dental, and eye exams.  Stay current with your vaccines (immunizations).  Do not use any tobacco products, such as cigarettes, chewing tobacco, and e-cigarettes. If you need help quitting, ask your health care provider.  Limit alcohol intake to no more than 2 drinks per day. One drink equals 12  ounces of beer, 5 ounces of wine, or 1 ounces of hard liquor.  Do not use street drugs.  Do not share needles.  Ask your health care provider for help if you need support or information about quitting drugs.  Tell your health care provider if you often feel depressed.  Tell your health care provider if you have ever been abused or do not feel safe at home. This information is not intended to replace advice given to you by your health care provider. Make sure you discuss any questions you have with your health care provider. Document Released: 12/13/2007 Document Revised: 02/13/2016 Document Reviewed: 03/20/2015 Elsevier Interactive Patient Education  2018 Elsevier Inc.  

## 2017-06-17 ENCOUNTER — Encounter: Payer: Self-pay | Admitting: Family Medicine

## 2017-06-18 ENCOUNTER — Encounter: Payer: Self-pay | Admitting: *Deleted

## 2017-06-21 NOTE — Progress Notes (Signed)
AWV reviewed and agree.  Signed:  Crissie Sickles, MD           06/21/2017

## 2017-06-30 IMAGING — CR DG CHEST 2V
2 series · 2 of 2 positions shown · non-contrast
Comparison: None in PACs

CLINICAL DATA: Dyspnea on exertion ; former smoker

EXAM:
CHEST  2 VIEW

[chest pa]
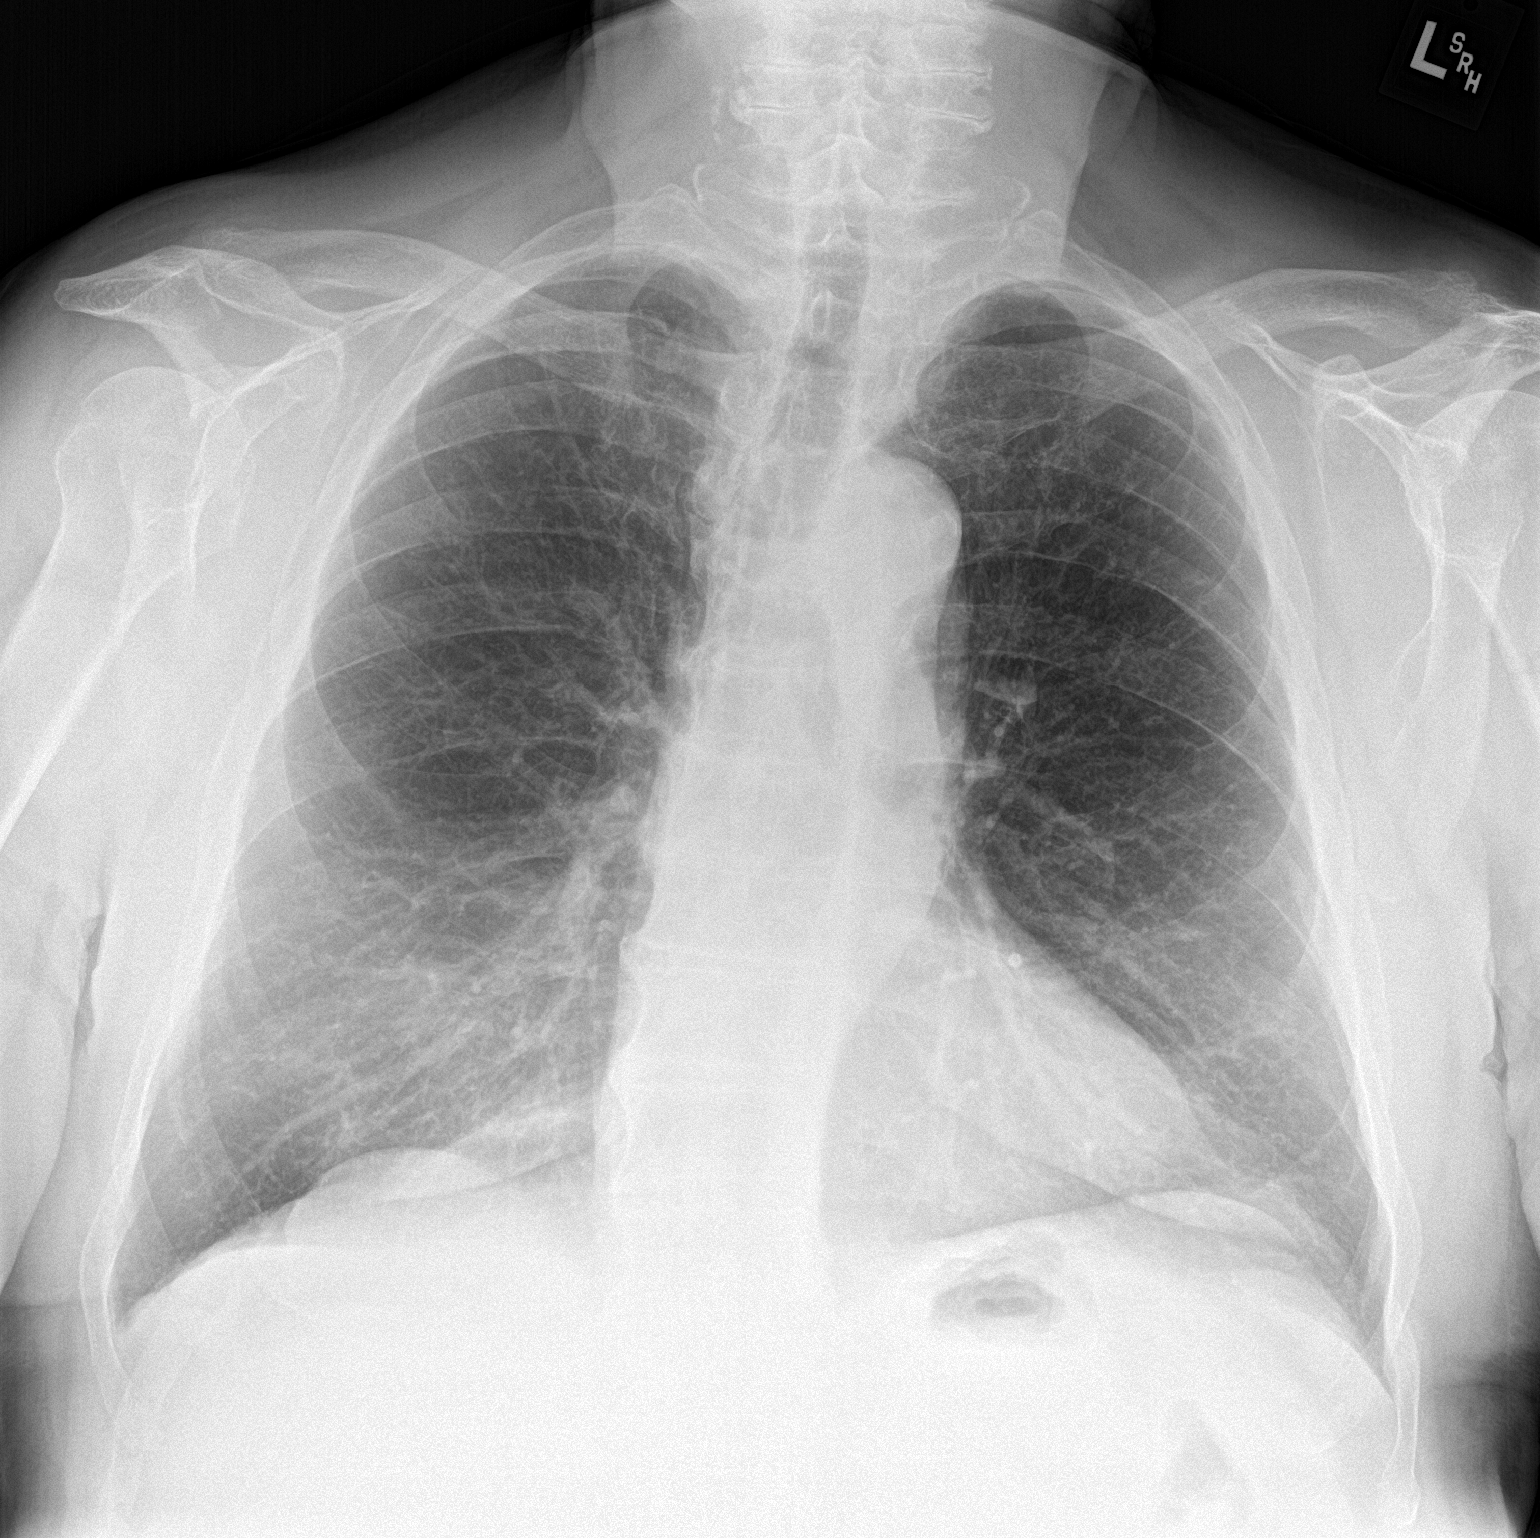

[chest lat]
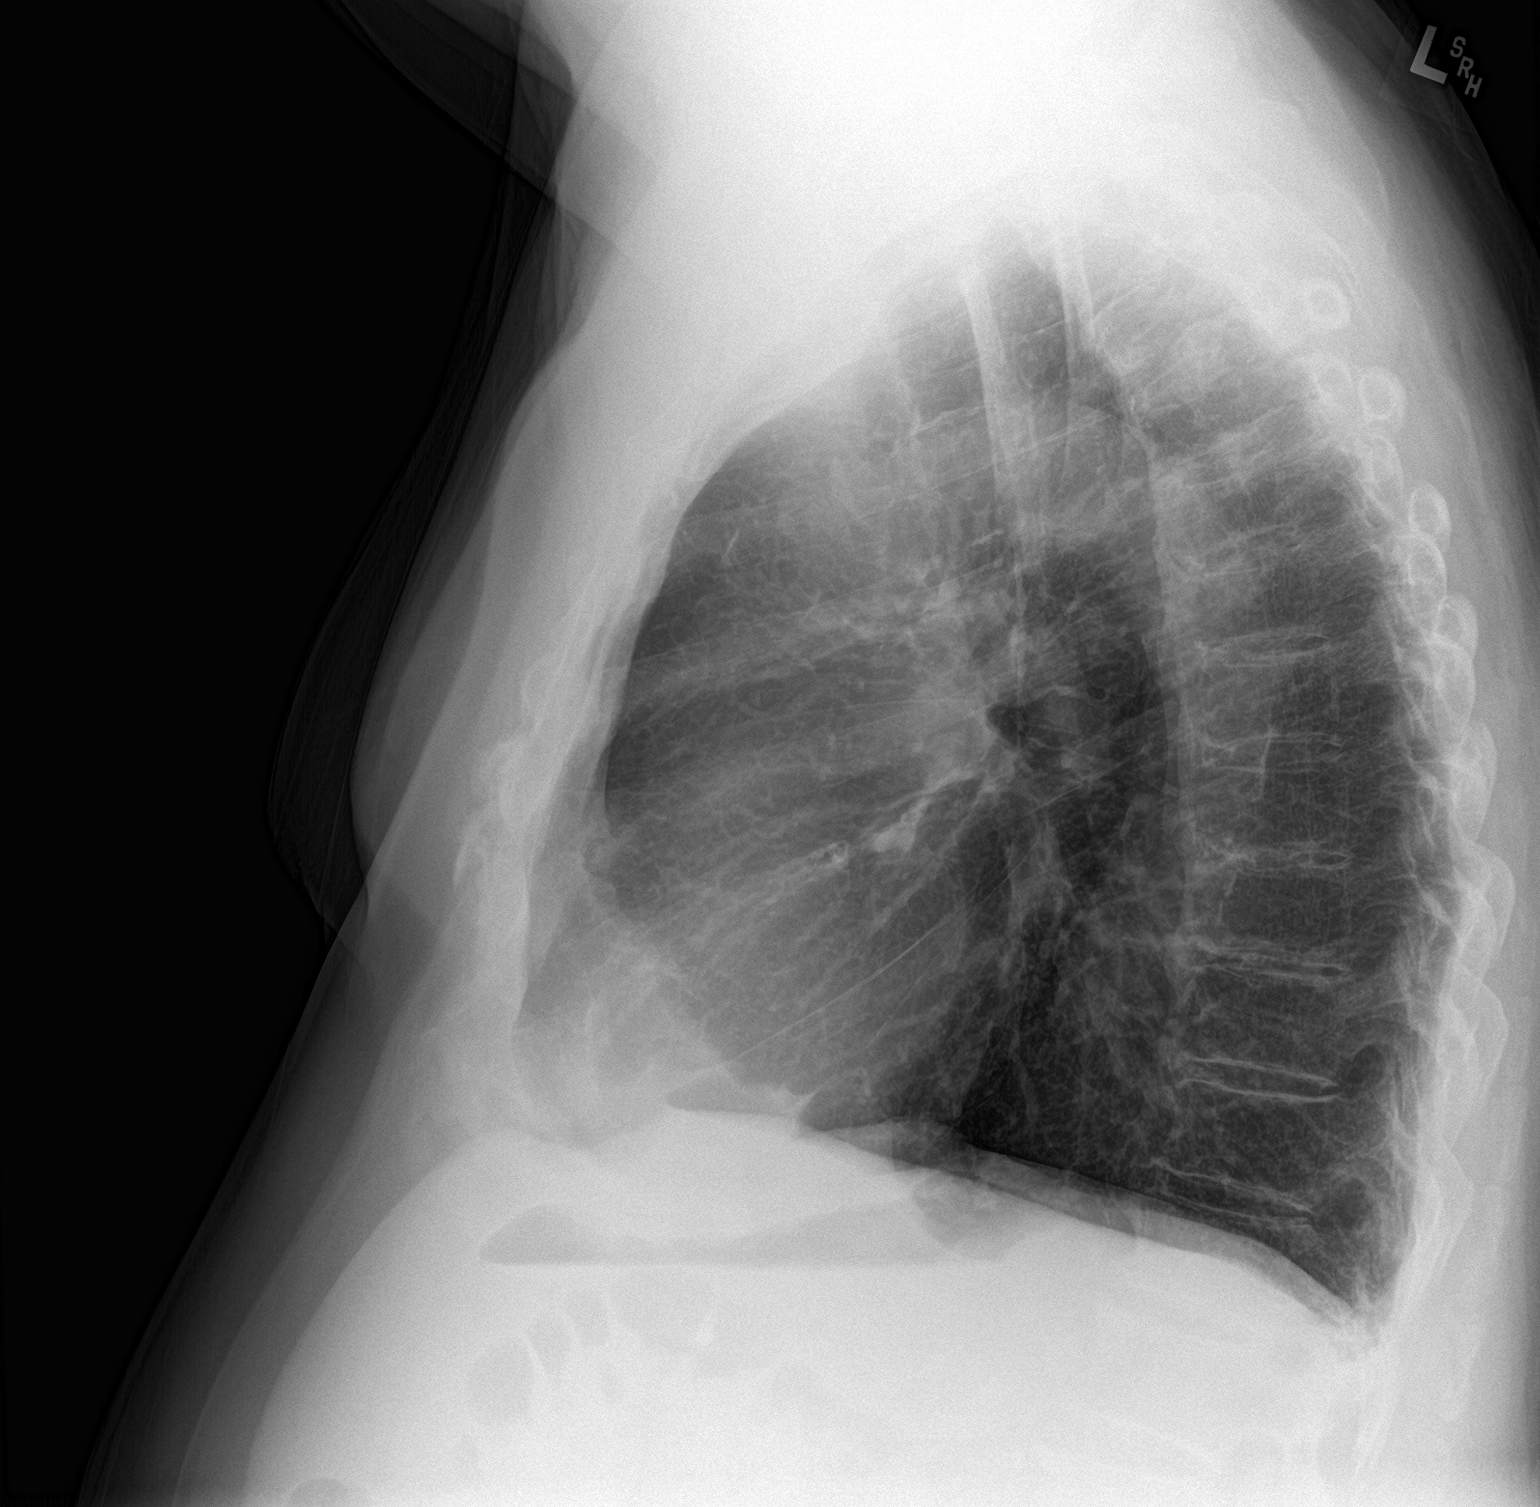

[2 of 2 positions shown; findings below may reference images not displayed]

FINDINGS: The lungs are mildly hyperinflated with hemidiaphragm flattening.
The interstitial markings are increased diffusely. There is no focal
infiltrate. There is no pleural effusion. The heart and pulmonary
vascularity are normal. The mediastinum is normal in width. There is
tortuosity of the descending thoracic aorta. There is mild
multilevel degenerative disc disease of the thoracic spine.
IMPRESSION: COPD. Mild pulmonary fibrotic changes. There is no evidence of CHF
nor pneumonia.

## 2017-07-01 ENCOUNTER — Telehealth: Payer: Self-pay | Admitting: Family Medicine

## 2017-07-01 MED ORDER — LOSARTAN POTASSIUM-HCTZ 100-12.5 MG PO TABS: 1.0000 | ORAL_TABLET | Freq: Every day | ORAL | 3 refills | Status: DC

## 2017-07-01 NOTE — Telephone Encounter (Signed)
Pts wife advised and voiced understanding, okay per DPR. 

## 2017-07-01 NOTE — Telephone Encounter (Signed)
Pt needs to see if another pharmacy has this medication in stock if so they can have there pharmacy transfer the Rx.   Left message for pt to call back.  Okay for PEC to advise pt or pts wife.

## 2017-07-01 NOTE — Telephone Encounter (Signed)
OK, have him discontinue irbesartan-hctz.  I sent in new rx for similar medication: losartan-hctz.-thx

## 2017-07-01 NOTE — Telephone Encounter (Signed)
Copied from [place] [id number]. Topic: Quick Communication - See Telephone Encounter >> Jul 01, 2017  1:18 PM Synthia Innocent wrote: CRM for notification. See Telephone encounter for:  Pharmacy unable to fill  irbesartan-hydrochlorothiazide (AVALIDE) 150-12.5 MG tablet, on back order. Can something else be called in? [facility] on [address] in [place]

## 2017-07-01 NOTE — Telephone Encounter (Signed)
Pt returning call to office regarding husbands medication.

## 2017-07-01 NOTE — Telephone Encounter (Signed)
Called pts wife back and she stated that they have checked with [facility] and [facility], neither of these pharmacy have the irbesartan-hctz. Please advise. Thanks.

## 2017-09-14 ENCOUNTER — Encounter: Payer: Self-pay | Admitting: Family Medicine

## 2017-09-14 ENCOUNTER — Ambulatory Visit (INDEPENDENT_AMBULATORY_CARE_PROVIDER_SITE_OTHER): Payer: Medicare Other | Admitting: Family Medicine

## 2017-09-14 VITALS — BP 138/78 | HR 63 | Temp 98.4°F | Resp 16 | Wt 194.0 lb

## 2017-09-14 DIAGNOSIS — E119 Type 2 diabetes mellitus without complications: Secondary | ICD-10-CM

## 2017-09-14 DIAGNOSIS — I10 Essential (primary) hypertension: Secondary | ICD-10-CM | POA: Diagnosis not present

## 2017-09-14 DIAGNOSIS — N183 Chronic kidney disease, stage 3 unspecified: Secondary | ICD-10-CM

## 2017-09-14 LAB — BASIC METABOLIC PANEL
BUN: 30 mg/dL — ABNORMAL HIGH (ref 6–23)
CALCIUM: 10.7 mg/dL — AB (ref 8.4–10.5)
CO2: 31 mEq/L (ref 19–32)
CREATININE: 2.42 mg/dL — AB (ref 0.40–1.50)
Chloride: 100 mEq/L (ref 96–112)
GFR: 27.32 mL/min — ABNORMAL LOW (ref >60.00)
Glucose, Bld: 112 mg/dL — ABNORMAL HIGH (ref 70–99)
Potassium: 5.3 mEq/L — ABNORMAL HIGH (ref 3.5–5.1)
Sodium: 140 mEq/L (ref 135–145)

## 2017-09-14 LAB — HEMOGLOBIN A1C: Hgb A1c MFr Bld: 6.3 % (ref 4.6–6.5)

## 2017-09-14 NOTE — Progress Notes (Signed)
OFFICE VISIT  09/14/2017   CC:  Chief Complaint  Patient presents with  . Follow-up    RCI    HPI:    Patient is a 84 y.o. Caucasian male who presents accompanied by his wife today for 4 mo f/u HTN, newly dx'd DM 2 (a1c 6.5% last f/u visit), CRI stage III.  DM: he did not want to see a nutritionist like we I had recommended after last visit. No home gluc monitoring.  Denies polyuria or polydipsia.  HTN: a few home bp's were checked since last visit: a couple into 170s/70s.  Says this was around the time he ate horribly salty gumbo in [place].  Denies HAs, vision changes, LE swelling, palpitations.  He has been sedentary due to recent weather. Uses a cane to get around. He does NOT want to add/increase bp med.  CRI: avoids NSAIDs.  Tries to hydrate well. Says he is urinating w/out any problem since getting on finasteride.  Has urol f/u soon.  ROS: no CP, no SOB, no cough, no dizziness, no LE swelling, no palpitations, no melena.  Past Medical History:  Diagnosis Date  . 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 Dois Davenport (urol).  Dr. Jeffie Pollock suspects this was due to stretch injury and has resolved as of 09/24/16.  . Arthritis of right wrist    s/p fracture of middle third of scaphoid bone sustained cleaning up from [facility] injection by Dr. Napoleon Form in [place] (ortho) made the pain go away.  Marland Kitchen BPH with obstruction/lower urinary tract symptoms    hx of urinary retention (s/p foley cath when in hosp for urosepsis 04/2016).  TURP will not help since he has acontractile bladder.  Options are CIC, indwelling urethral foley, or suprapubic catheter.  Has urethral foley as of 07/2016.  Marland Kitchen Chronic low back pain   . Chronic renal insufficiency, stage III (moderate) (HCC)    CrCl 30s (saw nephrologist in [place]). +microalbuminuria 04/2014 at PCP in [place].  Borderline III/IV 2018: GFR upper 20s.  Marland Kitchen COPD (chronic obstructive pulmonary disease)  ([place])    on CXR 09/27/2015  . Diverticulosis 04/2016   Noted on CT 04/2016  . Elevated PSA 06/2016   Dr. Redmond Pulling ([facility] urologist)-awaiting records (pt's wife reported pt's psa was 7).  Dr. Jeffie Pollock feels like this may have been a result of relatively recent episode of urinary retention, recheck PSA 12/29/16 was lower: 4.64, 24% f/t ratio, plan recheck 1 yr (Dr. Jeffie Pollock).   . History of anemia due to chronic kidney disease   . History of pyelonephritis 04/2016  . History of sepsis 04/2016   e coli  . Hypertension   . Kidney stones    one episode  . Pre-diabetes    2015 HbA1c 6.2-6.3 %.  HbA1c 06/2016 5.4%.  A1c 6.5% 05/2017--nutritionist referral recommended.  . Recurrent UTI 2017  . 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 (04/2016 CT)    Past Surgical History:  Procedure Laterality Date  . TONSILLECTOMY  1940  . VENTRAL HERNIA REPAIR  2010   with strangulation of SB (approx 1 foot of SB had to be excised).  Had 3 total surgeries for this due to complications    Outpatient Medications Prior to Visit  Medication Sig Dispense Refill  . aspirin 81 MG tablet Take 81 mg by mouth daily.    Marland Kitchen doxazosin (CARDURA) 8 MG tablet Take 1 tablet (8 mg total) by mouth daily. [place]  tablet 1  . finasteride (PROSCAR) 5 MG tablet Take 1 tablet (5 mg total) by mouth daily. 90 tablet 1  . losartan-hydrochlorothiazide (HYZAAR) 100-12.5 MG tablet Take 1 tablet by mouth daily. 90 tablet 3  . metoprolol succinate (TOPROL-XL) 50 MG 24 hr tablet TAKE ONE TABLET BY MOUTH ONCE DAILY WITH  OR  IMMEDIATELY  FOLLOWING  A  MEAL 90 tablet 1  . Multiple Vitamin (MULTIVITAMIN) tablet Take 1 tablet by mouth daily.    . vitamin B-12 (CYANOCOBALAMIN) 1000 MCG tablet Take 1,000 mcg by mouth every other day.     No facility-administered medications prior to visit.     Allergies  Allergen Reactions  . Amlodipine Other (See Comments)    Disequilibrium, generalized weakness.    ROS As per  HPI  PE: Blood pressure 138/78, pulse 63, temperature 98.4 F (36.9 C), temperature source Temporal, resp. rate 16, weight 194 lb (88 kg), SpO2 95 %. Gen: Alert, well appearing.  Patient is oriented to person, place, time, and situation. AFFECT: pleasant, lucid thought and speech. CV: RRR, no m/r/g.   LUNGS: CTA bilat, nonlabored resps, good aeration in all lung fields. EXT: no clubbing, cyanosis, or edema.    LABS:  No results found for: TSH Lab Results  Component Value Date   WBC 7.9 06/16/2017   HGB 14.7 06/16/2017   HCT 44.7 06/16/2017   MCV 95.7 06/16/2017   PLT 157.0 06/16/2017   Lab Results  Component Value Date   CREATININE 2.55 (H) 06/16/2017   BUN 35 (H) 06/16/2017   NA 142 06/16/2017   K 4.9 06/16/2017   CL 101 06/16/2017   CO2 34 (H) 06/16/2017   Lab Results  Component Value Date   ALT 9 12/20/2015   AST 11 12/20/2015   ALKPHOS 68 12/20/2015   BILITOT 0.9 12/20/2015   No results found for: CHOL No results found for: HDL No results found for: LDLCALC No results found for: TRIG No results found for: CHOLHDL Lab Results  Component Value Date   PSA 7.0 07/11/2016    Lab Results  Component Value Date   HGBA1C 6.5 06/16/2017    IMPRESSION AND PLAN:  1) New dx DM 2: recheck HbA1c today. Pt declines nutritionist referral. Some diab education given today.  2) HTN: some elev bp's at home but pt extremely resistant to adding meds at this time. Recheck bp in office today was normal.  3) CRI stage III: avoiding NSAIDs.  Hydrating well. Follow lytes/cr today.  An After Visit Summary was printed and given to the patient.  FOLLOW UP: Return in about 3 months (around 12/15/2017) for routine chronic illness f/u.  Signed:  Crissie Sickles, MD           09/14/2017

## 2017-09-15 ENCOUNTER — Other Ambulatory Visit: Payer: Self-pay

## 2017-09-15 DIAGNOSIS — E875 Hyperkalemia: Secondary | ICD-10-CM

## 2017-09-18 ENCOUNTER — Ambulatory Visit: Payer: Self-pay | Admitting: *Deleted

## 2017-09-18 NOTE — Telephone Encounter (Signed)
Please advise. Thanks.  

## 2017-09-18 NOTE — Telephone Encounter (Signed)
Pts wife advised and voiced understanding, okay per DPR. 

## 2017-09-18 NOTE — Telephone Encounter (Signed)
Definitely stop this multivitamin! -thx

## 2017-09-18 NOTE — Telephone Encounter (Signed)
Wife called in questioning if he should continue taking his Centrum Multivitamin with 80 mEq potassium or not.   Dr. Anitra Lauth told him to cut back on eating bananas and drinking orange juice because his potassium was too high.  I let her know I would send a note to Dr. Anitra Lauth asking him to advise you further.  Someone from the office will be contacting you.  I routed a note to Dr. Idelle Leech nurse pool with this question. Reason for Disposition . Pharmacy calling with prescription questions and triager unable to answer question  Answer Assessment - Initial Assessment Questions 1. SYMPTOMS: "Do you have any symptoms?"     The wife was wondering if her husband should continue taking his Centrum Multivitamin that has 80 mEq of potassium in it.   He was told by Dr. Anitra Lauth to stop eating bananas and drinking orange juice because his potassium was too high. 2. SEVERITY: If symptoms are present, ask "Are they mild, moderate or severe?"     No symptoms. I will route Dr. Anitra Lauth the message and see what he would advise.  Protocols used: MEDICATION QUESTION CALL-A-AH

## 2017-09-22 ENCOUNTER — Other Ambulatory Visit (INDEPENDENT_AMBULATORY_CARE_PROVIDER_SITE_OTHER): Payer: Medicare Other

## 2017-09-22 DIAGNOSIS — E875 Hyperkalemia: Secondary | ICD-10-CM

## 2017-09-22 LAB — BASIC METABOLIC PANEL
BUN: 36 mg/dL — ABNORMAL HIGH (ref 6–23)
CHLORIDE: 102 meq/L (ref 96–112)
CO2: 33 mEq/L — ABNORMAL HIGH (ref 19–32)
CREATININE: 2.47 mg/dL — AB (ref 0.40–1.50)
Calcium: 9.5 mg/dL (ref 8.4–10.5)
GFR: 26.68 mL/min — ABNORMAL LOW (ref >60.00)
Glucose, Bld: 120 mg/dL — ABNORMAL HIGH (ref 70–99)
POTASSIUM: 4.6 meq/L (ref 3.5–5.1)
Sodium: 141 mEq/L (ref 135–145)

## 2017-09-23 ENCOUNTER — Encounter: Payer: Self-pay | Admitting: *Deleted

## 2017-09-23 NOTE — Telephone Encounter (Signed)
Please advise. Thanks.  

## 2017-12-06 ENCOUNTER — Other Ambulatory Visit: Payer: Self-pay | Admitting: Family Medicine

## 2017-12-15 ENCOUNTER — Ambulatory Visit (INDEPENDENT_AMBULATORY_CARE_PROVIDER_SITE_OTHER): Payer: Medicare Other | Admitting: Family Medicine

## 2017-12-15 ENCOUNTER — Encounter: Payer: Self-pay | Admitting: Family Medicine

## 2017-12-15 VITALS — BP 161/99 | HR 59 | Temp 97.6°F | Resp 16 | Ht 66.5 in | Wt 195.5 lb

## 2017-12-15 DIAGNOSIS — N183 Chronic kidney disease, stage 3 unspecified: Secondary | ICD-10-CM

## 2017-12-15 DIAGNOSIS — I10 Essential (primary) hypertension: Secondary | ICD-10-CM | POA: Diagnosis not present

## 2017-12-15 DIAGNOSIS — E669 Obesity, unspecified: Secondary | ICD-10-CM | POA: Diagnosis not present

## 2017-12-15 DIAGNOSIS — E119 Type 2 diabetes mellitus without complications: Secondary | ICD-10-CM

## 2017-12-15 LAB — COMPREHENSIVE METABOLIC PANEL
ALBUMIN: 4.1 g/dL (ref 3.5–5.2)
ALK PHOS: 75 U/L (ref 39–117)
ALT: 12 U/L (ref 0–53)
AST: 10 U/L (ref 0–37)
BUN: 33 mg/dL — AB (ref 6–23)
CO2: 33 mEq/L — ABNORMAL HIGH (ref 19–32)
Calcium: 9.3 mg/dL (ref 8.4–10.5)
Chloride: 102 mEq/L (ref 96–112)
Creatinine, Ser: 2.63 mg/dL — ABNORMAL HIGH (ref 0.40–1.50)
GFR: 24.8 mL/min — ABNORMAL LOW (ref >60.00)
Glucose, Bld: 115 mg/dL — ABNORMAL HIGH (ref 70–99)
POTASSIUM: 4.6 meq/L (ref 3.5–5.1)
SODIUM: 143 meq/L (ref 135–145)
TOTAL PROTEIN: 6.6 g/dL (ref 6.0–8.3)
Total Bilirubin: 0.8 mg/dL (ref 0.2–1.2)

## 2017-12-15 LAB — HEMOGLOBIN A1C: Hgb A1c MFr Bld: 6.6 % — ABNORMAL HIGH (ref 4.6–6.5)

## 2017-12-15 NOTE — Progress Notes (Signed)
OFFICE VISIT  12/15/2017   CC:  Chief Complaint  Patient presents with  . Follow-up    RCI, pt is not fasting.    HPI:    Patient is a 84 y.o. Caucasian male who presents accompanied by his wife for 3 mo f/u HTN, CRI stage III (GFR 30s), and DM (dx'd by HbA1c 6.5% 05/2017). Feeling tired today.  Feels like this periodically, nothing consistent.  Ate a small amount of red beans and rice this morning.  He does work in yard some.  Doing UE theraband exercises daily.    DM: since dx of DM he has declined nutritionist referral.  Last a1c 3 mo ago was decreased from 6.5 to 6.3%. No change in diet EXCEPT was drinking a lot of OJ and eating lots of bananas and he stopped this and his K normalized. No burning, tingling, or numbness in feet.  HTN: white coat component; home bp's 130s/70 avg, HR avg 60.  CRI: avoids NSAIDs.  Drinks fluids well.  ROS: physically tired with walking.   Denies DOE, CP, palpitations, or dizziness. Has intermittent nasal congestion.  No LE swelling.    Past Medical History:  Diagnosis Date  . 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 Dois Davenport (urol).  Dr. Jeffie Pollock suspects this was due to stretch injury and has resolved as of 09/24/16.  . Arthritis of right wrist    s/p fracture of middle third of scaphoid bone sustained cleaning up from [facility] injection by Dr. Napoleon Form in [place] (ortho) made the pain go away.  Marland Kitchen BPH with obstruction/lower urinary tract symptoms    hx of urinary retention (s/p foley cath when in hosp for urosepsis 04/2016).  TURP will not help since he has acontractile bladder.  Options are CIC, indwelling urethral foley, or suprapubic catheter.  Has urethral foley as of 07/2016.  Marland Kitchen Chronic low back pain   . Chronic renal insufficiency, stage III (moderate) (HCC)    CrCl 30s (saw nephrologist in [place]). +microalbuminuria 04/2014 at PCP in [place].  Borderline III/IV 2018: GFR upper 20s.  Marland Kitchen COPD  (chronic obstructive pulmonary disease) ([place])    on CXR 09/27/2015  . Diverticulosis 04/2016   Noted on CT 04/2016  . Elevated PSA 06/2016   Dr. Redmond Pulling ([facility] urologist)-awaiting records (pt's wife reported pt's psa was 7).  Dr. Jeffie Pollock feels like this may have been a result of relatively recent episode of urinary retention, recheck PSA 12/29/16 was lower: 4.64, 24% f/t ratio, plan recheck 1 yr (Dr. Jeffie Pollock).   . History of anemia due to chronic kidney disease   . History of pyelonephritis 04/2016  . History of sepsis 04/2016   e coli  . Hypertension   . Kidney stones    one episode  . Pre-diabetes    2015 HbA1c 6.2-6.3 %.  HbA1c 06/2016 5.4%.  A1c 6.5% 05/2017--nutritionist referral recommended.  . Recurrent UTI 2017  . 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 (04/2016 CT)    Past Surgical History:  Procedure Laterality Date  . TONSILLECTOMY  1940  . VENTRAL HERNIA REPAIR  2010   with strangulation of SB (approx 1 foot of SB had to be excised).  Had 3 total surgeries for this due to complications    Outpatient Medications Prior to Visit  Medication Sig Dispense Refill  . aspirin 81 MG tablet Take 81 mg by mouth daily.    Marland Kitchen doxazosin (CARDURA) 8 MG tablet Take  1 tablet (8 mg total) by mouth daily. 90 tablet 1  . finasteride (PROSCAR) 5 MG tablet TAKE 1 TABLET BY MOUTH ONCE DAILY 90 tablet 1  . losartan-hydrochlorothiazide (HYZAAR) 100-12.5 MG tablet Take 1 tablet by mouth daily. 90 tablet 3  . metoprolol succinate (TOPROL-XL) 50 MG 24 hr tablet TAKE ONE TABLET BY MOUTH ONCE DAILY WITH  OR  IMMEDIATELY  FOLLOWING  A  MEAL 90 tablet 1  . Multiple Vitamin (MULTIVITAMIN) tablet Take 1 tablet by mouth daily.    . vitamin B-12 (CYANOCOBALAMIN) 1000 MCG tablet Take 1,000 mcg by mouth every other day.     No facility-administered medications prior to visit.     Allergies  Allergen Reactions  . Amlodipine Other (See Comments)    Disequilibrium, generalized  weakness.    ROS As per HPI  PE: Blood pressure (!) 161/99, pulse (!) 59, temperature 97.6 F (36.4 C), temperature source Oral, resp. rate 16, height 5' 6.5" (1.689 m), weight 195 lb 8 oz (88.7 kg), SpO2 94 %. Body mass index is 31.08 kg/m.  Gen: Alert, well appearing.  Patient is oriented to person, place, time, and situation. AFFECT: pleasant, lucid thought and speech. CV: RRR, no m/r/g.   LUNGS: CTA bilat, nonlabored resps, good aeration in all lung fields. EXT: no clubbing, cyanosis, or edema.  Foot exam - no swelling, tenderness or skin or vascular lesions.  Pes planus bilat.  Color and temperature is normal. Sensation is intact. Peripheral pulses are palpable. Toenails are thickened/mildly hypertrophic.   LABS:  No results found for: TSH Lab Results  Component Value Date   WBC 7.9 06/16/2017   HGB 14.7 06/16/2017   HCT 44.7 06/16/2017   MCV 95.7 06/16/2017   PLT 157.0 06/16/2017   Lab Results  Component Value Date   CREATININE 2.47 (H) 09/22/2017   BUN 36 (H) 09/22/2017   NA 141 09/22/2017   K 4.6 09/22/2017   CL 102 09/22/2017   CO2 33 (H) 09/22/2017   Lab Results  Component Value Date   ALT 9 12/20/2015   AST 11 12/20/2015   ALKPHOS 68 12/20/2015   BILITOT 0.9 12/20/2015   No results found for: CHOL No results found for: HDL No results found for: LDLCALC No results found for: TRIG No results found for: CHOLHDL Lab Results  Component Value Date   PSA 7.0 07/11/2016   Lab Results  Component Value Date   HGBA1C 6.3 09/14/2017    IMPRESSION AND PLAN:  DM 2:  He'll arrange eye exam ASAP. Feet exam today: no sensory deficits. A1c today. Pt agreeable to FLP check next o/v.  Hepatic panel today. He says he will not take cholesterol med but would likely try adjusting diet if chol came back high.  He and his wife state that in the past his cholesterol levels have been excellent.  2) HTN: white coat component. Home bp's great.   Lytes/cr  today.  3) CRI III: hydrating well.  Avoids NSAIDs. Cr/lytes today.  An After Visit Summary was printed and given to the patient.  FOLLOW UP: Return in about 3 months (around 03/17/2018) for routine chronic illness f/u.  Signed:  Crissie Sickles, MD           12/15/2017

## 2017-12-16 ENCOUNTER — Encounter: Payer: Self-pay | Admitting: *Deleted

## 2017-12-21 DIAGNOSIS — N401 Enlarged prostate with lower urinary tract symptoms: Secondary | ICD-10-CM | POA: Diagnosis not present

## 2017-12-21 DIAGNOSIS — R972 Elevated prostate specific antigen [PSA]: Secondary | ICD-10-CM | POA: Diagnosis not present

## 2017-12-21 DIAGNOSIS — Z87440 Personal history of urinary (tract) infections: Secondary | ICD-10-CM | POA: Diagnosis not present

## 2017-12-21 DIAGNOSIS — R351 Nocturia: Secondary | ICD-10-CM | POA: Diagnosis not present

## 2017-12-30 ENCOUNTER — Telehealth: Payer: Self-pay | Admitting: Family Medicine

## 2018-01-06 ENCOUNTER — Telehealth: Payer: Self-pay | Admitting: Family Medicine

## 2018-01-06 MED ORDER — CYCLOBENZAPRINE HCL 5 MG PO TABS: ORAL_TABLET | ORAL | 1 refills | Status: DC

## 2018-01-06 NOTE — Telephone Encounter (Signed)
Please call patient to discuss reason for refusal. I see where Heather D/C'd medication but when Rx's are denied for any reason pt must be contacted.

## 2018-01-06 NOTE — Telephone Encounter (Signed)
Patient's wife notified and verbalized understanding.  

## 2018-01-06 NOTE — Telephone Encounter (Signed)
Patient would like a call from Dr. Isla Pence nurse regarding refused flexeril.

## 2018-01-06 NOTE — Telephone Encounter (Signed)
Patients wife states patient takes the Flexeril occasionally and they are requesting this be refilled. It is no longer on patients current med list since 06/16/17. Doyou want to refill this for the patient?

## 2018-01-06 NOTE — Telephone Encounter (Signed)
OK.  Flexeril eRx'd just now.-thx

## 2018-01-06 NOTE — Telephone Encounter (Signed)
Copied from [place] [id number]. Topic: Quick Communication - See Telephone Encounter >> Jan 06, 2018  9:22 AM Neva Seat wrote: Cyclobenzatr 5 mg was denied at the pharmacy. Pt has 1 more

## 2018-01-17 ENCOUNTER — Other Ambulatory Visit: Payer: Self-pay | Admitting: Family Medicine

## 2018-02-01 ENCOUNTER — Other Ambulatory Visit: Payer: Self-pay | Admitting: Family Medicine

## 2018-02-25 ENCOUNTER — Telehealth: Payer: Self-pay | Admitting: Family Medicine

## 2018-02-25 NOTE — Telephone Encounter (Signed)
Taking mucinex is fine as long as it does not have phenylephrine with it ("decongestant").-thx

## 2018-02-25 NOTE — Telephone Encounter (Signed)
Pts wife advised and voiced understanding. The otc medication she had did have the phenylephrine in it so she will try to find something without it.

## 2018-02-25 NOTE — Telephone Encounter (Signed)
SW pts wife, okay per DPR.   I advised her to speak w/ pts pharmacist, they will be able to better advise if there are any interactions between the otc med and pts Rx meds.   She stated that she did but wanted to check with Dr. Anitra Lauth as well.   No call back needed. Just an FYI.

## 2018-02-25 NOTE — Telephone Encounter (Signed)
Copied from [place] [phone]. Topic: Quick Communication - See Telephone Encounter >> Feb 25, 2018  9:27 AM Ivar Drape wrote: CRM for notification. See Telephone encounter for: 02/25/18. Patient's wife wanted to know if the patient's blood pressure medication would interfere with taking mucinex fast max for congestion and cough.  Please advise.

## 2018-02-28 DIAGNOSIS — J9601 Acute respiratory failure with hypoxia: Secondary | ICD-10-CM | POA: Diagnosis not present

## 2018-02-28 DIAGNOSIS — I129 Hypertensive chronic kidney disease with stage 1 through stage 4 chronic kidney disease, or unspecified chronic kidney disease: Secondary | ICD-10-CM | POA: Diagnosis present

## 2018-02-28 DIAGNOSIS — R0689 Other abnormalities of breathing: Secondary | ICD-10-CM | POA: Diagnosis not present

## 2018-02-28 DIAGNOSIS — Z7982 Long term (current) use of aspirin: Secondary | ICD-10-CM | POA: Diagnosis not present

## 2018-02-28 DIAGNOSIS — R0602 Shortness of breath: Secondary | ICD-10-CM | POA: Diagnosis not present

## 2018-02-28 DIAGNOSIS — R0682 Tachypnea, not elsewhere classified: Secondary | ICD-10-CM | POA: Diagnosis not present

## 2018-02-28 DIAGNOSIS — J208 Acute bronchitis due to other specified organisms: Secondary | ICD-10-CM | POA: Diagnosis not present

## 2018-02-28 DIAGNOSIS — E876 Hypokalemia: Secondary | ICD-10-CM | POA: Diagnosis not present

## 2018-02-28 DIAGNOSIS — Z7984 Long term (current) use of oral hypoglycemic drugs: Secondary | ICD-10-CM | POA: Diagnosis not present

## 2018-02-28 DIAGNOSIS — I491 Atrial premature depolarization: Secondary | ICD-10-CM | POA: Diagnosis not present

## 2018-02-28 DIAGNOSIS — N184 Chronic kidney disease, stage 4 (severe): Secondary | ICD-10-CM | POA: Diagnosis not present

## 2018-02-28 DIAGNOSIS — E1122 Type 2 diabetes mellitus with diabetic chronic kidney disease: Secondary | ICD-10-CM | POA: Diagnosis present

## 2018-02-28 DIAGNOSIS — E119 Type 2 diabetes mellitus without complications: Secondary | ICD-10-CM | POA: Insufficient documentation

## 2018-02-28 DIAGNOSIS — R05 Cough: Secondary | ICD-10-CM | POA: Diagnosis not present

## 2018-02-28 DIAGNOSIS — R509 Fever, unspecified: Secondary | ICD-10-CM | POA: Diagnosis not present

## 2018-02-28 DIAGNOSIS — N400 Enlarged prostate without lower urinary tract symptoms: Secondary | ICD-10-CM | POA: Diagnosis present

## 2018-02-28 DIAGNOSIS — I10 Essential (primary) hypertension: Secondary | ICD-10-CM | POA: Diagnosis not present

## 2018-02-28 DIAGNOSIS — B9789 Other viral agents as the cause of diseases classified elsewhere: Secondary | ICD-10-CM | POA: Diagnosis present

## 2018-02-28 DIAGNOSIS — R0902 Hypoxemia: Secondary | ICD-10-CM | POA: Diagnosis not present

## 2018-02-28 DIAGNOSIS — A419 Sepsis, unspecified organism: Secondary | ICD-10-CM | POA: Diagnosis not present

## 2018-02-28 DIAGNOSIS — J189 Pneumonia, unspecified organism: Secondary | ICD-10-CM | POA: Diagnosis not present

## 2018-02-28 DIAGNOSIS — Z87891 Personal history of nicotine dependence: Secondary | ICD-10-CM | POA: Diagnosis not present

## 2018-02-28 DIAGNOSIS — Z79899 Other long term (current) drug therapy: Secondary | ICD-10-CM | POA: Diagnosis not present

## 2018-03-03 DIAGNOSIS — A419 Sepsis, unspecified organism: Secondary | ICD-10-CM | POA: Insufficient documentation

## 2018-03-05 MED ORDER — GENERIC EXTERNAL MEDICATION
1.00 | Status: DC
Start: 2018-03-03 — End: 2018-03-05

## 2018-03-05 MED ORDER — METOPROLOL SUCCINATE ER 25 MG PO TB24
25.00 | ORAL_TABLET | ORAL | Status: DC
Start: 2018-03-04 — End: 2018-03-05

## 2018-03-05 MED ORDER — GENERIC EXTERNAL MEDICATION
Status: DC
Start: 2018-03-04 — End: 2018-03-05

## 2018-03-05 MED ORDER — ASPIRIN 81 MG PO CHEW
81.00 | CHEWABLE_TABLET | ORAL | Status: DC
Start: 2018-03-04 — End: 2018-03-05

## 2018-03-05 MED ORDER — GENERIC EXTERNAL MEDICATION
3.00 | Status: DC
Start: ? — End: 2018-03-05

## 2018-03-05 MED ORDER — SODIUM CHLORIDE 0.9 % IV SOLN
10.00 | INTRAVENOUS | Status: DC
Start: ? — End: 2018-03-05

## 2018-03-05 MED ORDER — HEPARIN SODIUM (PORCINE) 5000 UNIT/ML IJ SOLN
5000.00 | INTRAMUSCULAR | Status: DC
Start: 2018-03-03 — End: 2018-03-05

## 2018-03-05 MED ORDER — GENERIC EXTERNAL MEDICATION
500.00 | Status: DC
Start: 2018-03-03 — End: 2018-03-05

## 2018-03-05 MED ORDER — NITROGLYCERIN 0.4 MG SL SUBL
0.40 | SUBLINGUAL_TABLET | SUBLINGUAL | Status: DC
Start: ? — End: 2018-03-05

## 2018-03-05 MED ORDER — GENERIC EXTERNAL MEDICATION
10.00 | Status: DC
Start: ? — End: 2018-03-05

## 2018-03-05 MED ORDER — HYDRALAZINE HCL 20 MG/ML IJ SOLN
10.00 | INTRAMUSCULAR | Status: DC
Start: ? — End: 2018-03-05

## 2018-03-05 MED ORDER — DOXAZOSIN MESYLATE 2 MG PO TABS
4.00 | ORAL_TABLET | ORAL | Status: DC
Start: 2018-03-03 — End: 2018-03-05

## 2018-03-05 MED ORDER — DM-GUAIFENESIN ER 30-600 MG PO TB12
1.00 | ORAL_TABLET | ORAL | Status: DC
Start: ? — End: 2018-03-05

## 2018-03-12 ENCOUNTER — Encounter: Payer: Self-pay | Admitting: Family Medicine

## 2018-03-12 ENCOUNTER — Telehealth: Payer: Self-pay | Admitting: Family Medicine

## 2018-03-12 ENCOUNTER — Ambulatory Visit (INDEPENDENT_AMBULATORY_CARE_PROVIDER_SITE_OTHER): Payer: Medicare Other | Admitting: Family Medicine

## 2018-03-12 VITALS — BP 147/84 | HR 57 | Temp 97.9°F | Resp 16 | Ht 66.5 in | Wt 181.0 lb

## 2018-03-12 DIAGNOSIS — I10 Essential (primary) hypertension: Secondary | ICD-10-CM

## 2018-03-12 DIAGNOSIS — J129 Viral pneumonia, unspecified: Secondary | ICD-10-CM | POA: Diagnosis not present

## 2018-03-12 DIAGNOSIS — N184 Chronic kidney disease, stage 4 (severe): Secondary | ICD-10-CM

## 2018-03-12 DIAGNOSIS — J9601 Acute respiratory failure with hypoxia: Secondary | ICD-10-CM

## 2018-03-12 DIAGNOSIS — R5381 Other malaise: Secondary | ICD-10-CM | POA: Diagnosis not present

## 2018-03-12 DIAGNOSIS — E119 Type 2 diabetes mellitus without complications: Secondary | ICD-10-CM

## 2018-03-12 DIAGNOSIS — J189 Pneumonia, unspecified organism: Secondary | ICD-10-CM

## 2018-03-12 DIAGNOSIS — N3946 Mixed incontinence: Secondary | ICD-10-CM | POA: Diagnosis not present

## 2018-03-12 LAB — BASIC METABOLIC PANEL
BUN: 33 mg/dL — ABNORMAL HIGH (ref 6–23)
CO2: 29 meq/L (ref 19–32)
Calcium: 9.3 mg/dL (ref 8.4–10.5)
Chloride: 102 mEq/L (ref 96–112)
Creatinine, Ser: 2.28 mg/dL — ABNORMAL HIGH (ref 0.40–1.50)
GFR: 29.23 mL/min — AB (ref >60.00)
GLUCOSE: 106 mg/dL — AB (ref 70–99)
POTASSIUM: 5.2 meq/L — AB (ref 3.5–5.1)
Sodium: 140 mEq/L (ref 135–145)

## 2018-03-12 NOTE — Telephone Encounter (Signed)
Please advise. Thanks.  

## 2018-03-12 NOTE — Telephone Encounter (Signed)
Yes, please ask him to fast.

## 2018-03-12 NOTE — Progress Notes (Signed)
OFFICE VISIT  03/15/2018   CC:  Chief Complaint  Patient presents with  . Follow-up    RCI, pt was recently in hospital for 3 days, meds were changed   HPI:    Patient is a 84 y.o. Caucasian male who presents accompanied by his wife for hospitalization follow up. Pt admitted to [facility] 9/1-9/4, 2019.   Dx was acute hypoxic resp failure from acute viral bronchitis (rhinovirus). Also provisional dx of sepsis.  CXR was clear, blood cultures neg.  Empiric abx given but stopped when viral resp panel returned with result of + rhinovirus.  Since going home he has had just a little cough and some lingering fatigue but otherwise is feeling MUCH better.  No fevers, no SOB, no CP, no hemoptysis, no dizziness, no HA, no n/v/abd pain or diarrhea. His valsartan/hctz was changed to losartan/hct 100/12.5mg  qd.  He was told to take 1/2 of his toprol xl 50mg  qd b/c hr dipped into the 40s. Took 3d of zithromax upon d/c.  He was weened off oxygen in hosp and felt much improved. Renal function stable at stage 4 range in hosp, Hba1c was 5.8%.  Since going home his bp's have been normal.  He has no new complaints.  Past Medical History:  Diagnosis Date  . 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 Dois Davenport (urol).  Dr. Jeffie Pollock suspects this was due to stretch injury and has resolved as of 09/24/16.  . Arthritis of right wrist    s/p fracture of middle third of scaphoid bone sustained cleaning up from [facility] injection by Dr. Napoleon Form in [place] (ortho) made the pain go away.  Marland Kitchen BPH with obstruction/lower urinary tract symptoms    hx of urinary retention (s/p foley cath when in hosp for urosepsis 04/2016).  TURP will not help since he has acontractile bladder.  Options are CIC, indwelling urethral foley, or suprapubic catheter.  Has urethral foley as of 07/2016.  Marland Kitchen Chronic low back pain   . Chronic renal insufficiency, stage III (moderate) (HCC)    CrCl 30s (saw  nephrologist in [place]). +microalbuminuria 04/2014 at PCP in [place].  Borderline III/IV 2018: GFR upper 20s.  Marland Kitchen COPD (chronic obstructive pulmonary disease) ([place])    on CXR 09/27/2015  . Diverticulosis 04/2016   Noted on CT 04/2016  . Elevated PSA 06/2016   Dr. Redmond Pulling ([facility] urologist)-awaiting records (pt's wife reported pt's psa was 7).  Dr. Jeffie Pollock feels like this may have been a result of relatively recent episode of urinary retention, recheck PSA 12/29/16 was lower: 4.64, 24% f/t ratio, plan recheck 1 yr (Dr. Jeffie Pollock).   . History of anemia due to chronic kidney disease   . History of pyelonephritis 04/2016  . History of sepsis 04/2016   e coli  . Hypertension   . Kidney stones    one episode  . Pre-diabetes    2015 HbA1c 6.2-6.3 %.  HbA1c 06/2016 5.4%.  A1c 6.5% 05/2017--nutritionist referral recommended.  . Recurrent UTI 2017  . 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 (04/2016 CT)    Past Surgical History:  Procedure Laterality Date  . TONSILLECTOMY  1940  . VENTRAL HERNIA REPAIR  2010   with strangulation of SB (approx 1 foot of SB had to be excised).  Had 3 total surgeries for this due to complications    Outpatient Medications Prior to Visit  Medication Sig Dispense Refill  . aspirin 81 MG tablet  Take 81 mg by mouth daily.    . cyclobenzaprine (FLEXERIL) 5 MG tablet 1 tab po bid prn neck pain 30 tablet 1  . doxazosin (CARDURA) 8 MG tablet Take 1 tablet (8 mg total) by mouth daily. 90 tablet 1  . finasteride (PROSCAR) 5 MG tablet TAKE 1 TABLET BY MOUTH ONCE DAILY 90 tablet 1  . losartan-hydrochlorothiazide (HYZAAR) 100-12.5 MG tablet Take 1 tablet by mouth daily. 90 tablet 3  . metoprolol succinate (TOPROL-XL) 50 MG 24 hr tablet TAKE 1 TABLET BY MOUTH ONCE DAILY WITH OR IMMEDIATELY FOLLOWING A MEAL. 90 tablet 1  . doxazosin (CARDURA) 8 MG tablet TAKE 1 TABLET BY MOUTH ONCE DAILY (Patient not taking: Reported on 03/12/2018) 90 tablet 1   . Multiple Vitamin (MULTIVITAMIN) tablet Take 1 tablet by mouth daily.    . vitamin B-12 (CYANOCOBALAMIN) 1000 MCG tablet Take 1,000 mcg by mouth every other day.     No facility-administered medications prior to visit.     Allergies  Allergen Reactions  . Amlodipine Other (See Comments)    Disequilibrium, generalized weakness.    ROS As per HPI  PE: Blood pressure (!) 147/84, pulse (!) 57, temperature 97.9 F (36.6 C), temperature source Oral, resp. rate 16, height 5' 6.5" (1.689 m), weight 181 lb (82.1 kg), SpO2 96 %. Gen: Alert, well appearing.  Patient is oriented to person, place, time, and situation. AFFECT: pleasant, lucid thought and speech. HQI:ONGE: no injection, icteris, swelling, or exudate.  EOMI, PERRLA. Mouth: lips without lesion/swelling.  Oral mucosa pink and moist. Oropharynx without erythema, exudate, or swelling. Neck - No masses or thyromegaly or limitation in range of motion CV: RRR, no m/r/g.   LUNGS: CTA bilat, nonlabored resps, good aeration in all lung fields. Abd: soft, NT/ND, BS normal. EXT: no clubbing or cyanosis.  no edema.    LABS:  No results found for: TSH Lab Results  Component Value Date   WBC 7.9 06/16/2017   HGB 14.7 06/16/2017   HCT 44.7 06/16/2017   MCV 95.7 06/16/2017   PLT 157.0 06/16/2017  No results found for: IRON, TIBC, FERRITIN  Lab Results  Component Value Date   CREATININE 2.28 (H) 03/12/2018   BUN 33 (H) 03/12/2018   NA 140 03/12/2018   K 5.2 (H) 03/12/2018   CL 102 03/12/2018   CO2 29 03/12/2018   Lab Results  Component Value Date   ALT 12 12/15/2017   AST 10 12/15/2017   ALKPHOS 75 12/15/2017   BILITOT 0.8 12/15/2017   No results found for: CHOL No results found for: HDL No results found for: LDLCALC No results found for: TRIG No results found for: CHOLHDL Lab Results  Component Value Date   PSA 7.0 07/11/2016   Lab Results  Component Value Date   HGBA1C 6.6 (H) 12/15/2017    IMPRESSION AND  PLAN:  1) CAP; viral.  Led to hypoxic resp failure. Improved s/p hospitalization for oxygen support, monitoring. Blood clx neg.  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 Recheck this today.  3) DM 2: A1c excellent in hosp on 03/01/18.  Diet controlled. Next HbA1c in 3 mo.  4) HTN: The current medical regimen is effective;  continue present plan and medications. Lytes/cr today.  5) Debilitated pt: he is still relatively weak diffusely so he would like a walker for stability/balance for now-->rx written today. Also wrote rx for depends briefs for chronic urinary incontinence.  An After Visit Summary was printed and given to the patient.  FOLLOW UP: Return in about 3 months (around 06/11/2018) for routine chronic illness f/u.  Signed:  Crissie Sickles, MD           03/15/2018  Reviewed all hospital records after pt left today--PM

## 2018-03-12 NOTE — Telephone Encounter (Signed)
Patient's wife is asking if patient needs to fast at his next appointment. Please call.

## 2018-03-13 ENCOUNTER — Encounter: Payer: Self-pay | Admitting: Family Medicine

## 2018-03-15 NOTE — Telephone Encounter (Signed)
Please advise. Thanks.  

## 2018-03-15 NOTE — Telephone Encounter (Signed)
Pt also sent mychart message about this. I sent response via mychart.

## 2018-03-16 ENCOUNTER — Encounter: Payer: Self-pay | Admitting: Family Medicine

## 2018-03-16 NOTE — Telephone Encounter (Signed)
Please advise. Thanks.  

## 2018-03-17 ENCOUNTER — Ambulatory Visit: Payer: Medicare Other | Admitting: Family Medicine

## 2018-03-17 ENCOUNTER — Encounter: Payer: Self-pay | Admitting: *Deleted

## 2018-05-17 ENCOUNTER — Ambulatory Visit: Payer: Self-pay

## 2018-05-17 NOTE — Telephone Encounter (Signed)
Pt's wife calling with the pt beside her to see if the pt could take AZO over the counter to help treat urinary incontinence. Pt currently denies any burning, abdominal pain or changes in urine color. Pt states he has been experiencing these symptoms for about a month and has to wear a brief at night and has progressively worsened lately.Pt states is now having trouble affording the briefs.Pt states he drinks a lot of water during the course of the day. Pt states he has not been evaluated for this by his provider previously. Pt offered to make an appt for evaluation but pt states he wants to wait until previously scheduled appt on 06/15/18 and would just like to know if PCP would have any recommendations.  Reason for Disposition . [1] Can't control passage of urine (i.e., urinary incontinence, wetting self) AND [2] present > 2 weeks  Answer Assessment - Initial Assessment Questions 1. SYMPTOM: "What's the main symptom you're concerned about?" (e.g., frequency, incontinence)     incontinence 2. ONSET: "When did the  incontinence  start?"     Approximately a month ago 3. PAIN: "Is there any pain?" If so, ask: "How bad is it?" (Scale: 1-10; mild, moderate, severe)     Pt denies any pain 4. CAUSE: "What do you think is causing the symptoms?"     Unsure but wife thins it may be related to age 35. OTHER SYMPTOMS: "Do you have any other symptoms?" (e.g., fever, flank pain, blood in urine, pain with urination)     Denies other symptoms at this time.  6. PREGNANCY: "Is there any chance you are pregnant?" "When was your last menstrual period?"     n/a  Protocols used: URINARY [facility]

## 2018-05-17 NOTE — Telephone Encounter (Signed)
Patient called, left VM to return call to the office to discuss symptoms inquiring taking AZO for bladder leakage.

## 2018-05-18 ENCOUNTER — Other Ambulatory Visit: Payer: Self-pay

## 2018-05-18 ENCOUNTER — Encounter: Payer: Self-pay | Admitting: Family Medicine

## 2018-05-18 MED ORDER — OXYBUTYNIN CHLORIDE ER 5 MG PO TB24: 5.0000 mg | ORAL_TABLET | Freq: Every day | ORAL | 1 refills | Status: DC

## 2018-05-18 NOTE — Telephone Encounter (Signed)
I did note that he has chronic urge incontinence when we talked at his last f/u visit about 2 mo ago. As long as he is not having any burning with urination or seeing blood in his urine, then I will eRx a low dose of a med to see if it helps with his incontinence. AZO will not help his problem.

## 2018-05-18 NOTE — Telephone Encounter (Signed)
Please advise. Thanks.  

## 2018-05-18 NOTE — Telephone Encounter (Signed)
Pts wife advised and voiced understanding, okay per DPR. 

## 2018-05-30 ENCOUNTER — Other Ambulatory Visit: Payer: Self-pay | Admitting: Family Medicine

## 2018-06-15 ENCOUNTER — Ambulatory Visit (INDEPENDENT_AMBULATORY_CARE_PROVIDER_SITE_OTHER): Payer: Medicare Other | Admitting: Family Medicine

## 2018-06-15 ENCOUNTER — Encounter: Payer: Self-pay | Admitting: Family Medicine

## 2018-06-15 VITALS — BP 126/84 | HR 63 | Resp 16 | Ht 66.5 in | Wt 189.0 lb

## 2018-06-15 DIAGNOSIS — E119 Type 2 diabetes mellitus without complications: Secondary | ICD-10-CM

## 2018-06-15 DIAGNOSIS — Z87898 Personal history of other specified conditions: Secondary | ICD-10-CM | POA: Diagnosis not present

## 2018-06-15 DIAGNOSIS — Z23 Encounter for immunization: Secondary | ICD-10-CM

## 2018-06-15 DIAGNOSIS — N183 Chronic kidney disease, stage 3 unspecified: Secondary | ICD-10-CM

## 2018-06-15 DIAGNOSIS — I10 Essential (primary) hypertension: Secondary | ICD-10-CM

## 2018-06-15 LAB — BASIC METABOLIC PANEL
BUN: 29 mg/dL — ABNORMAL HIGH (ref 6–23)
CHLORIDE: 101 meq/L (ref 96–112)
CO2: 32 meq/L (ref 19–32)
Calcium: 9.5 mg/dL (ref 8.4–10.5)
Creatinine, Ser: 2.37 mg/dL — ABNORMAL HIGH (ref 0.40–1.50)
GFR: 27.94 mL/min — ABNORMAL LOW (ref >60.00)
Glucose, Bld: 115 mg/dL — ABNORMAL HIGH (ref 70–99)
POTASSIUM: 4.2 meq/L (ref 3.5–5.1)
SODIUM: 141 meq/L (ref 135–145)

## 2018-06-15 LAB — HEMOGLOBIN A1C: Hgb A1c MFr Bld: 6.1 % (ref 4.6–6.5)

## 2018-06-15 MED ORDER — HYDRALAZINE HCL 10 MG PO TABS: 10.0000 mg | ORAL_TABLET | Freq: Three times a day (TID) | ORAL | 2 refills | Status: DC

## 2018-06-15 NOTE — Progress Notes (Signed)
OFFICE VISIT  06/15/2018   CC:  Chief Complaint  Patient presents with  . Follow-up    RCI    HPI:    Patient is a 84 y.o. Caucasian male who presents for 3 mo f/u HTN, CRI III, and diet controlled diabetes.   HTN: home monitoring --> avg 140s over 80s, with no bp higher than 150s and no low blood pressure.  CRI: avoids NSAIDs.  Hydrates ok.  DM:  Improving diet: less carbs, smaller portion sizes.  He is cutting back on rice, pasta, potatoes.  No colas. One glass of 1/2 and 1/2 sweet tea when he goes out to eat.  ROS: no CP, no SOB, no wheezing, no cough, no dizziness, no HAs, no rashes, no melena/hematochezia.  No polyuria or polydipsia.  No myalgias or arthralgias.  Past Medical History:  Diagnosis Date  . 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 Dois Davenport (urol).  Dr. Jeffie Pollock suspects this was due to stretch injury and has resolved as of 09/24/16.  . Arthritis of right wrist    s/p fracture of middle third of scaphoid bone sustained cleaning up from [facility] injection by Dr. Napoleon Form in [place] (ortho) made the pain go away.  Marland Kitchen BPH with obstruction/lower urinary tract symptoms    hx of urinary retention (s/p foley cath when in hosp for urosepsis 04/2016).  TURP will not help since he has acontractile bladder.  Options are CIC, indwelling urethral foley, or suprapubic catheter.  Has urethral foley as of 07/2016.  Marland Kitchen Chronic low back pain   . Chronic renal insufficiency, stage III (moderate) (HCC)    CrCl 30s (saw nephrologist in [place]). +microalbuminuria 04/2014 at PCP in [place].  Borderline III/IV 2018: GFR upper 20s.  Marland Kitchen COPD (chronic obstructive pulmonary disease) ([place])    on CXR 09/27/2015  . Diabetes mellitus ([place])    2015 HbA1c 6.2-6.3 %.  HbA1c 06/2016 5.4%.  A1c 6.5% 05/2017 (this was his first A1c of 6.5% or more)--nutritionist referral recommended.  . Diverticulosis 04/2016   Noted on CT 04/2016  . Elevated PSA 06/2016   Dr. Redmond Pulling ([facility] urologist)-awaiting records (pt's wife reported pt's psa was 7).  Dr. Jeffie Pollock feels like this may have been a result of relatively recent episode of urinary retention, recheck PSA 12/29/16 was lower: 4.64, 24% f/t ratio, plan recheck 1 yr (Dr. Jeffie Pollock).   . History of anemia due to chronic kidney disease   . History of pyelonephritis 04/2016  . History of sepsis 04/2016   e coli  . Hypertension   . Kidney stones    one episode  . Recurrent UTI 2017  . 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 (04/2016 CT)  . Urge incontinence     Past Surgical History:  Procedure Laterality Date  . TONSILLECTOMY  1940  . VENTRAL HERNIA REPAIR  2010   with strangulation of SB (approx 1 foot of SB had to be excised).  Had 3 total surgeries for this due to complications    Outpatient Medications Prior to Visit  Medication Sig Dispense Refill  . aspirin 81 MG tablet Take 81 mg by mouth daily.    . cyclobenzaprine (FLEXERIL) 5 MG tablet 1 tab po bid prn neck pain 30 tablet 1  . doxazosin (CARDURA) 8 MG tablet TAKE 1 TABLET BY MOUTH ONCE DAILY 90 tablet 1  . finasteride (PROSCAR) 5 MG tablet TAKE 1 TABLET BY MOUTH ONCE DAILY 90 tablet  1  . losartan-hydrochlorothiazide (HYZAAR) 100-12.5 MG tablet Take 1 tablet by mouth daily. 90 tablet 3  . metoprolol succinate (TOPROL-XL) 50 MG 24 hr tablet TAKE 1 TABLET BY MOUTH ONCE DAILY WITH OR IMMEDIATELY FOLLOWING A MEAL. 90 tablet 1  . Multiple Vitamin (MULTIVITAMIN) tablet Take 1 tablet by mouth daily.    Marland Kitchen oxybutynin (DITROPAN-XL) 5 MG 24 hr tablet Take 1 tablet (5 mg total) by mouth at bedtime. 30 tablet 1  . vitamin B-12 (CYANOCOBALAMIN) 1000 MCG tablet Take 1,000 mcg by mouth every other day.    . doxazosin (CARDURA) 8 MG tablet Take 1 tablet (8 mg total) by mouth daily. 90 tablet 1   No facility-administered medications prior to visit.     Allergies  Allergen Reactions  . Amlodipine Other (See Comments)     Disequilibrium, generalized weakness.    ROS As per HPI  PE: Blood pressure 126/84, pulse 63, resp. rate 16, height 5' 6.5" (1.689 m), weight 189 lb (85.7 kg), SpO2 96 %. Gen: Alert, well appearing.  Patient is oriented to person, place, time, and situation. AFFECT: pleasant, lucid thought and speech. CV: RRR, no m/r/g.   LUNGS: CTA bilat, nonlabored resps, good aeration in all lung fields. EXT: no clubbing or cyanosis.  no edema.    LABS:  No results found for: TSH Lab Results  Component Value Date   WBC 7.9 06/16/2017   HGB 14.7 06/16/2017   HCT 44.7 06/16/2017   MCV 95.7 06/16/2017   PLT 157.0 06/16/2017   Lab Results  Component Value Date   CREATININE 2.28 (H) 03/12/2018   BUN 33 (H) 03/12/2018   NA 140 03/12/2018   K 5.2 (H) 03/12/2018   CL 102 03/12/2018   CO2 29 03/12/2018   Lab Results  Component Value Date   ALT 12 12/15/2017   AST 10 12/15/2017   ALKPHOS 75 12/15/2017   BILITOT 0.8 12/15/2017   No results found for: CHOL No results found for: HDL No results found for: LDLCALC No results found for: TRIG No results found for: CHOLHDL Lab Results  Component Value Date   PSA 7.0 07/11/2016   Lab Results  Component Value Date   HGBA1C 6.6 (H) 12/15/2017     IMPRESSION AND PLAN:  1) HTN, control not ideal.  Will add hydralazine 10 mg tid. Lytes/cr today. Continue home bp monitoring.  2) , CRI III: avoiding NSAIDs.  Working on hydrating adequately. Diabetes well controlled.   Lytes/cr today.  3) Diet controlled diabetes: great diet. HbA1c today.  4) Elevated PSA: We'll get results of last PSA (done this year) from Dr. Ralene Muskrat office.  [facility] for result of PSA done 12/21/17 in their office.  An After Visit Summary was printed and given to the patient.   FOLLOW UP: 3 mo  Signed:  Crissie Sickles, MD           06/15/2018

## 2018-07-07 ENCOUNTER — Other Ambulatory Visit: Payer: Self-pay | Admitting: Family Medicine

## 2018-07-27 ENCOUNTER — Other Ambulatory Visit: Payer: Self-pay | Admitting: Family Medicine

## 2018-07-27 NOTE — Telephone Encounter (Signed)
RF request for oxybutynin LOV: 06/15/18 Next ov: None Last written: 05/18/18 #30 w/ 1RF  Please advise. Thanks.

## 2018-07-30 ENCOUNTER — Encounter: Payer: Self-pay | Admitting: Family Medicine

## 2018-08-02 ENCOUNTER — Encounter: Payer: Self-pay | Admitting: Family Medicine

## 2018-08-02 NOTE — Telephone Encounter (Signed)
Left message for pt to call back.   Need more information.

## 2018-08-02 NOTE — Telephone Encounter (Signed)
SW pt's wife, she stated that pt c/o dizziness, lightheaded, confused and difficulty walking when he was taking the hydralazine 3 x daily. She stated that for the last 3 days pt has only been taking it once daily and has been feeling better.  Most recent BP checks have been 07/31/18 BP: 160/86 HR: 57 08/02/18 BP: 130/85 HR: 53  Please advise. Thanks.

## 2018-08-03 NOTE — Telephone Encounter (Signed)
Yes, ok to continue hydralazine just once a day for now. Check bp and HR daily and call to report numbers (or send mychart message) in 1 week.

## 2018-08-10 NOTE — Telephone Encounter (Signed)
See BP readings.   Please advise. Thanks.

## 2018-08-10 NOTE — Telephone Encounter (Signed)
BP's look great now. Having any dizziness/lightheaded feeling anymore since taking hydralazine only once a day?

## 2018-08-25 ENCOUNTER — Other Ambulatory Visit: Payer: Self-pay | Admitting: Family Medicine

## 2018-09-28 ENCOUNTER — Telehealth: Payer: Self-pay

## 2018-09-28 NOTE — Telephone Encounter (Signed)
Received fax from patient's pharmacy. Losartan/HCT 100-12.5mg  tab are on back order.  Please advise if you would like to switch to something else or have patient take Losartan and HCTZ separately, thanks.

## 2018-09-29 MED ORDER — OLMESARTAN MEDOXOMIL-HCTZ 40-12.5 MG PO TABS: 1.0000 | ORAL_TABLET | Freq: Every day | ORAL | 1 refills | Status: DC

## 2018-09-29 NOTE — Telephone Encounter (Signed)
I eRx'd benicar hct to take instead of losartan hct. They are essentially interchangeable meds so it should be a seamless transition from one to the other.-thx

## 2018-09-30 ENCOUNTER — Encounter: Payer: Self-pay | Admitting: Family Medicine

## 2018-09-30 ENCOUNTER — Telehealth: Payer: Self-pay | Admitting: Family Medicine

## 2018-09-30 NOTE — Telephone Encounter (Signed)
Please advise if okay for pt to take Oxybutynin w/ Benicar med?

## 2018-09-30 NOTE — Telephone Encounter (Signed)
Sent to Dr Anitra Lauth to review

## 2018-09-30 NOTE — Telephone Encounter (Signed)
My Chart message reads as follows: Doctor McGowen... my husband;  Gary Morgan 05/03/2032 was taking Losartan/HCT 100-12.5, each night along with Oxybutnin 5MG , and his other medicines you prescribed. He said he takes all at the same time as it helps him not forget any. ...  His new replacement medicine called Olmesartan HCTZ 40-12.5  The pharmacist told me it is also a diuretic....  Gary Morgan wants to ask you if he can take this replacement medicine at night, while still taking Oxybutnin at night. He said he needs the Oxybutnin so he doesn't have to get up so often overnight. ...  Thank you. Gary Morgan [phone]  Please advise

## 2018-09-30 NOTE — Telephone Encounter (Signed)
YES THIS IS FINE.-thx

## 2018-09-30 NOTE — Telephone Encounter (Signed)
Pt's wife, [name] advised.

## 2018-09-30 NOTE — Telephone Encounter (Signed)
Patient's wife would like to know if patient can take new bp med with oxybutynin at night?

## 2018-10-19 ENCOUNTER — Other Ambulatory Visit: Payer: Self-pay | Admitting: Family Medicine

## 2018-11-29 ENCOUNTER — Other Ambulatory Visit: Payer: Self-pay | Admitting: Family Medicine

## 2018-11-29 ENCOUNTER — Encounter: Payer: Self-pay | Admitting: Family Medicine

## 2018-11-29 NOTE — Telephone Encounter (Signed)
Patient over due for 3 month RCI follow up.  Patient scheduled telephone visit 11/30/2018 ( no Internet or smart phone for VV ) .  RX's sent # 90 day with 1 additional rf.

## 2018-11-29 NOTE — Telephone Encounter (Signed)
Patient needs refill metoprolol succinate, finasteride, oxybutynin [facility]

## 2018-11-30 ENCOUNTER — Ambulatory Visit (INDEPENDENT_AMBULATORY_CARE_PROVIDER_SITE_OTHER): Payer: Medicare Other | Admitting: Family Medicine

## 2018-11-30 ENCOUNTER — Encounter: Payer: Self-pay | Admitting: Family Medicine

## 2018-11-30 ENCOUNTER — Other Ambulatory Visit: Payer: Self-pay

## 2018-11-30 VITALS — BP 118/114 | HR 58

## 2018-11-30 DIAGNOSIS — I10 Essential (primary) hypertension: Secondary | ICD-10-CM

## 2018-11-30 DIAGNOSIS — E118 Type 2 diabetes mellitus with unspecified complications: Secondary | ICD-10-CM

## 2018-11-30 DIAGNOSIS — N183 Chronic kidney disease, stage 3 unspecified: Secondary | ICD-10-CM

## 2018-11-30 NOTE — Telephone Encounter (Signed)
FYI for Dr Anitra Lauth from Patients wife

## 2018-11-30 NOTE — Progress Notes (Signed)
Virtual Visit via Video Note  I connected with pt on 11/30/18 at 10:20 AM EDT by telephone (pt did not have the technology necessary to do video visit) and verified that I am speaking with the correct person using two identifiers.  Location patient: home Location provider:work or home office Persons participating in the virtual visit: patient, provider  I discussed the limitations of evaluation and management by telemedicine and the availability of in person appointments. The patient expressed understanding and agreed to proceed.  Telemedicine visit is a necessity given the COVID-19 restrictions in place at the current time.  HPI: 85 y/o WM with whom I am doing a telephone visit today (due to COVID-19 pandemic restrictions) for f/u diet-controlled DM 2, HTN, and CRI III. Says he feels good, lots of energy, walks once daily outside.  HTN: started hydralazine 10mg  tid last f/u visit b/c bp still a little high. He was only able to tolerate qd dosing of this med due to dizziness and fatigue.  One bp a few days ago 140/65, P 60. 163/100 P 73.  Pt reports some measurements that don't make sense (118/114). VERY difficult to tell what a typical bp at home is for him.  After 10 min of talking today his wife discovered by looking at his pill box that he has not been taking metoprolol the last week or so.  DM: not eating strict diabetic diet.  Eats lots of fruits and veggies, lean meats, dairy, eggs, not a lot of sugar.  CRI III: he avoids NSAIDs.  Hydrates fairly well.  Urinary issues (chronic): no recent f/u with Dr. Jeffie Pollock but he does plan on f/u in near future. At this point all he says is he has some leakage of urine during the night most nights.    ROS: no CP, no SOB, no wheezing, no cough, no dizziness, no HAs, no rashes, no melena/hematochezia.  No polyuria or polydipsia.  No myalgias or arthralgias.   Past Medical History:  Diagnosis Date  . 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 Dois Davenport (urol).  Dr. Jeffie Pollock suspects this was due to stretch injury and has resolved as of 09/24/16.  . Arthritis of right wrist    s/p fracture of middle third of scaphoid bone sustained cleaning up from [facility] injection by Dr. Napoleon Form in [place] (ortho) made the pain go away.  Marland Kitchen BPH with obstruction/lower urinary tract symptoms    hx of urinary retention (s/p foley cath when in hosp for urosepsis 04/2016).  TURP will not help since he has acontractile bladder.  Options are CIC, indwelling urethral foley, or suprapubic catheter.  Has urethral foley as of 07/2016.  Marland Kitchen Chronic low back pain   . Chronic renal insufficiency, stage III (moderate) (HCC)    CrCl 30s (saw nephrologist in [place]). +microalbuminuria 04/2014 at PCP in [place].  Borderline III/IV 2018: GFR upper 20s.  Marland Kitchen COPD (chronic obstructive pulmonary disease) ([place])    on CXR 09/27/2015  . Diabetes mellitus ([place])    2015 HbA1c 6.2-6.3 %.  HbA1c 06/2016 5.4%.  A1c 6.5% 05/2017 (this was his first A1c of 6.5% or more)--nutritionist referral recommended.  . Diverticulosis 04/2016   Noted on CT 04/2016  . Elevated PSA 06/2016   Dr. Redmond Pulling ([facility] urologist)-awaiting records (pt's wife reported pt's psa was 7).  Dr. Jeffie Pollock feels like this may have been a result of relatively recent episode of urinary retention, recheck PSA 12/29/16 was lower: 4.64, 24% f/t ratio, plan recheck 1  yr (Dr. Jeffie Pollock).   . History of anemia due to chronic kidney disease   . History of pyelonephritis 04/2016  . History of sepsis 04/2016   e coli  . Hypertension   . Kidney stones    one episode  . Recurrent UTI 2017  . 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 (04/2016 CT)  . Urge incontinence     Past Surgical History:  Procedure Laterality Date  . TONSILLECTOMY  1940  . VENTRAL HERNIA REPAIR  2010   with strangulation of SB (approx 1 foot of SB had to be excised).  Had 3  total surgeries for this due to complications    Family History  Problem Relation Age of Onset  . Breast cancer Mother   . Diabetes Mother     SOCIAL HX: Married, several children, orig from [place], retired [employer], no T/A/Ds.   Current Outpatient Medications:  .  aspirin 81 MG tablet, Take 81 mg by mouth daily., Disp: , Rfl:  .  cyclobenzaprine (FLEXERIL) 5 MG tablet, 1 tab po bid prn neck pain, Disp: 30 tablet, Rfl: 1 .  doxazosin (CARDURA) 8 MG tablet, TAKE 1 TABLET BY MOUTH ONCE DAILY, Disp: 90 tablet, Rfl: 1 .  finasteride (PROSCAR) 5 MG tablet, Take 1 tablet by mouth once daily, Disp: 90 tablet, Rfl: 1 .  hydrALAZINE (APRESOLINE) 10 MG tablet, Take 1 tablet (10 mg total) by mouth 3 (three) times daily., Disp: 90 tablet, Rfl: 2 .  metoprolol succinate (TOPROL-XL) 50 MG 24 hr tablet, TAKE 1 TABLET BY MOUTH ONCE DAILY WITH OR IMMEDIATELY FOLLOWING A MEAL., Disp: 90 tablet, Rfl: 1 .  Multiple Vitamin (MULTIVITAMIN) tablet, Take 1 tablet by mouth daily., Disp: , Rfl:  .  olmesartan-hydrochlorothiazide (BENICAR HCT) 40-12.5 MG tablet, Take 1 tablet by mouth daily., Disp: 90 tablet, Rfl: 1 .  oxybutynin (DITROPAN-XL) 5 MG 24 hr tablet, TAKE 1 TABLET BY MOUTH AT BEDTIME, Disp: 90 tablet, Rfl: 1 .  vitamin B-12 (CYANOCOBALAMIN) 1000 MCG tablet, Take 1,000 mcg by mouth every other day., Disp: , Rfl:   EXAM:  VITALS per patient if applicable: BP (!) 161/096 (BP Location: Left Arm, Patient Position: Sitting, Cuff Size: Normal)   Pulse (!) 58    Gen: pt alert, communicative, pleasant affect, lucid thought and speech. No further exam b/c this is a telephone encounter.  LABS: none today    Chemistry      Component Value Date/Time   NA 141 06/15/2018 1021   K 4.2 06/15/2018 1021   CL 101 06/15/2018 1021   CO2 32 06/15/2018 1021   BUN 29 (H) 06/15/2018 1021   CREATININE 2.37 (H) 06/15/2018 1021      Component Value Date/Time   CALCIUM 9.5 06/15/2018 1021   ALKPHOS  75 12/15/2017 1105   AST 10 12/15/2017 1105   ALT 12 12/15/2017 1105   BILITOT 0.8 12/15/2017 1105     Lab Results  Component Value Date   WBC 7.9 06/16/2017   HGB 14.7 06/16/2017   HCT 44.7 06/16/2017   MCV 95.7 06/16/2017   PLT 157.0 06/16/2017   No results found for: CHOL, HDL, LDLCALC, LDLDIRECT, TRIG, CHOLHDL No results found for: TSH Lab Results  Component Value Date   HGBA1C 6.1 06/15/2018    ASSESSMENT AND PLAN:  Discussed the following assessment and plan:  1) DM 2, diet controlled. We'll get HbA1c when he comes back for in-office visit in 2  wks.  2) HTN: control of his bp has always been unclear.  At this time we'll just have him resume his toprol xl 50mg  qd that he has not been taking for the last week. No new med added today. He will check bp and HR every day for the next 2 wks and write numbers down to review with me at o/v in 2 wks.  3) CRI III/IV: avoids NSAIDs.  Hydrates well. Needs CMET at f/u o/v in 2 wks.  CMET and A1c have been ordered and just need to be released at his f/u visit.   I discussed the assessment and treatment plan with the patient. The patient was provided an opportunity to ask questions and all were answered. The patient agreed with the plan and demonstrated an understanding of the instructions.   The patient was advised to call back or seek an in-person evaluation if the symptoms worsen or if the condition fails to improve as anticipated.  Spent 15 min with pt today, with >50% of this time spent in counseling and care coordination regarding the above problems.  F/u; 2 wks  Signed:  Crissie Sickles, MD           11/30/2018

## 2018-12-01 NOTE — Telephone Encounter (Signed)
Noted  

## 2018-12-14 ENCOUNTER — Ambulatory Visit: Payer: Medicare Other | Admitting: Family Medicine

## 2018-12-21 ENCOUNTER — Encounter: Payer: Self-pay | Admitting: Family Medicine

## 2018-12-21 ENCOUNTER — Other Ambulatory Visit: Payer: Self-pay

## 2018-12-21 ENCOUNTER — Ambulatory Visit (INDEPENDENT_AMBULATORY_CARE_PROVIDER_SITE_OTHER): Payer: Medicare Other | Admitting: Family Medicine

## 2018-12-21 VITALS — BP 144/80 | HR 51 | Temp 98.2°F | Resp 16 | Ht 66.5 in | Wt 191.6 lb

## 2018-12-21 DIAGNOSIS — N183 Chronic kidney disease, stage 3 unspecified: Secondary | ICD-10-CM

## 2018-12-21 DIAGNOSIS — I10 Essential (primary) hypertension: Secondary | ICD-10-CM

## 2018-12-21 DIAGNOSIS — N184 Chronic kidney disease, stage 4 (severe): Secondary | ICD-10-CM | POA: Diagnosis not present

## 2018-12-21 DIAGNOSIS — E118 Type 2 diabetes mellitus with unspecified complications: Secondary | ICD-10-CM | POA: Diagnosis not present

## 2018-12-21 DIAGNOSIS — E119 Type 2 diabetes mellitus without complications: Secondary | ICD-10-CM | POA: Diagnosis not present

## 2018-12-21 LAB — COMPREHENSIVE METABOLIC PANEL
ALT: 11 U/L (ref 0–53)
AST: 11 U/L (ref 0–37)
Albumin: 4.2 g/dL (ref 3.5–5.2)
Alkaline Phosphatase: 68 U/L (ref 39–117)
BUN: 32 mg/dL — ABNORMAL HIGH (ref 6–23)
CO2: 32 mEq/L (ref 19–32)
Calcium: 9.3 mg/dL (ref 8.4–10.5)
Chloride: 102 mEq/L (ref 96–112)
Creatinine, Ser: 2.55 mg/dL — ABNORMAL HIGH (ref 0.40–1.50)
GFR: 24.12 mL/min — ABNORMAL LOW (ref >60.00)
Glucose, Bld: 100 mg/dL — ABNORMAL HIGH (ref 70–99)
Potassium: 4.7 mEq/L (ref 3.5–5.1)
Sodium: 140 mEq/L (ref 135–145)
Total Bilirubin: 0.8 mg/dL (ref 0.2–1.2)
Total Protein: 6.5 g/dL (ref 6.0–8.3)

## 2018-12-21 LAB — HEMOGLOBIN A1C: Hgb A1c MFr Bld: 6.3 % (ref 4.6–6.5)

## 2018-12-21 NOTE — Patient Instructions (Signed)
Continue to check your blood pressure and heart rate 2 times per week. Your goal blood pressure AVERAGE is 135/85. If bp consistently > 140 on top or >90 on bottom, call or return.

## 2018-12-21 NOTE — Progress Notes (Signed)
OFFICE VISIT  12/21/2018   CC:  Chief Complaint  Patient presents with  . Follow-up    HTN, 2 weeks   HPI:    Patient is a 85 y.o. Caucasian male who presents for 2 week f/u HTN. He has GFR in the upper 20s and diet controlled DM. Unfortunately, he has had significant recurrent problems with acontractile bladder and BPH--likely the source of at least some of his CRI (+medical renal dz). Last visit (virtual), pt had not been taking his metoprolol for 1 wk, so we got him back on this and made no other changes. BP and HR checks were to be done for review with me today. These were consistently 130/80 or better.  HR 60 avg.  DM 2: no home glucose monitoring. He eats a decent diabetic diet.  CRI: he hydrates fairly well.  He avoids NSAIDs.  ROS: no CP, no SOB, no wheezing, no cough, no dizziness, no HAs, no rashes, no melena/hematochezia.  No polyuria or polydipsia.  No myalgias or arthralgias.   Past Medical History:  Diagnosis Date  . 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 Dois Davenport (urol).  Dr. Jeffie Pollock suspects this was due to stretch injury and has resolved as of 09/24/16.  . Arthritis of right wrist    s/p fracture of middle third of scaphoid bone sustained cleaning up from [facility] injection by Dr. Napoleon Form in [place] (ortho) made the pain go away.  Marland Kitchen BPH with obstruction/lower urinary tract symptoms    hx of urinary retention (s/p foley cath when in hosp for urosepsis 04/2016).  TURP will not help since he has acontractile bladder.  Options are CIC, indwelling urethral foley, or suprapubic catheter.  Has urethral foley as of 07/2016.  Marland Kitchen Chronic low back pain   . Chronic renal insufficiency, stage III (moderate) (HCC)    CrCl 30s (saw nephrologist in [place]). +microalbuminuria 04/2014 at PCP in [place].  Borderline III/IV 2018: GFR upper 20s.  Marland Kitchen COPD (chronic obstructive pulmonary disease) ([place])    on CXR 09/27/2015  . Diabetes  mellitus ([place])    2015 HbA1c 6.2-6.3 %.  HbA1c 06/2016 5.4%.  A1c 6.5% 05/2017 (this was his first A1c of 6.5% or more)--nutritionist referral recommended.  . Diverticulosis 04/2016   Noted on CT 04/2016  . Elevated PSA 06/2016   Dr. Redmond Pulling ([facility] urologist)-awaiting records (pt's wife reported pt's psa was 7).  Dr. Jeffie Pollock feels like this may have been a result of relatively recent episode of urinary retention, recheck PSA 12/29/16 was lower: 4.64, 24% f/t ratio, plan recheck 1 yr (Dr. Jeffie Pollock).   . History of anemia due to chronic kidney disease   . History of pyelonephritis 04/2016  . History of sepsis 04/2016   e coli  . Hypertension   . Kidney stones    one episode  . Recurrent UTI 2017  . 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 (04/2016 CT)  . Urge incontinence     Past Surgical History:  Procedure Laterality Date  . TONSILLECTOMY  1940  . VENTRAL HERNIA REPAIR  2010   with strangulation of SB (approx 1 foot of SB had to be excised).  Had 3 total surgeries for this due to complications    Outpatient Medications Prior to Visit  Medication Sig Dispense Refill  . aspirin 81 MG tablet Take 81 mg by mouth daily.    . cyclobenzaprine (FLEXERIL) 5 MG tablet 1 tab po bid  prn neck pain 30 tablet 1  . doxazosin (CARDURA) 8 MG tablet TAKE 1 TABLET BY MOUTH ONCE DAILY 90 tablet 1  . finasteride (PROSCAR) 5 MG tablet Take 1 tablet by mouth once daily 90 tablet 1  . hydrALAZINE (APRESOLINE) 10 MG tablet Take 1 tablet (10 mg total) by mouth 3 (three) times daily. (Patient taking differently: Take 10 mg by mouth 3 (three) times daily. Pt takes once daily) 90 tablet 2  . metoprolol succinate (TOPROL-XL) 50 MG 24 hr tablet TAKE 1 TABLET BY MOUTH ONCE DAILY WITH OR IMMEDIATELY FOLLOWING A MEAL. 90 tablet 1  . Multiple Vitamin (MULTIVITAMIN) tablet Take 1 tablet by mouth daily.    Marland Kitchen olmesartan-hydrochlorothiazide (BENICAR HCT) 40-12.5 MG tablet Take 1 tablet by mouth  daily. 90 tablet 1  . oxybutynin (DITROPAN-XL) 5 MG 24 hr tablet TAKE 1 TABLET BY MOUTH AT BEDTIME 90 tablet 1  . vitamin B-12 (CYANOCOBALAMIN) 1000 MCG tablet Take 1,000 mcg by mouth every other day.     No facility-administered medications prior to visit.     Allergies  Allergen Reactions  . Amlodipine Other (See Comments)    Disequilibrium, generalized weakness.    ROS As per HPI  PE: Initial bp today was 179/81 by automated bp machine.  Recheck with manual cuff 144/80.   Blood pressure (!) 144/80, pulse (!) 51, temperature 98.2 F (36.8 C), temperature source Temporal, resp. rate 16, height 5' 6.5" (1.689 m), weight 191 lb 9.6 oz (86.9 kg), SpO2 96 %. Gen: Alert, well appearing.  Patient is oriented to person, place, time, and situation. AFFECT: pleasant, lucid thought and speech. CV: RRR, no m/r/g.   LUNGS: CTA bilat, nonlabored resps, good aeration in all lung fields. EXT: no clubbing or cyanosis.  Trace bilat LL pitting edema.    LABS:    Chemistry      Component Value Date/Time   NA 141 06/15/2018 1021   K 4.2 06/15/2018 1021   CL 101 06/15/2018 1021   CO2 32 06/15/2018 1021   BUN 29 (H) 06/15/2018 1021   CREATININE 2.37 (H) 06/15/2018 1021      Component Value Date/Time   CALCIUM 9.5 06/15/2018 1021   ALKPHOS 75 12/15/2017 1105   AST 10 12/15/2017 1105   ALT 12 12/15/2017 1105   BILITOT 0.8 12/15/2017 1105     Lab Results  Component Value Date   WBC 7.9 06/16/2017   HGB 14.7 06/16/2017   HCT 44.7 06/16/2017   MCV 95.7 06/16/2017   PLT 157.0 06/16/2017   Lab Results  Component Value Date   HGBA1C 6.1 06/15/2018     IMPRESSION AND PLAN:  1) HTN: good control per home monitoring since getting back on toprol xl 50mg  qd. Continue all current meds at current doses.  BMET today.  2) DM 2, diet controlled: Hba1c today.  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 Avoid NSAIDs.  An After Visit Summary was  printed and given to the patient.  FOLLOW UP: Return in about 6 months (around 06/22/2019) for routine chronic illness f/u.  Signed:  Crissie Sickles, MD           12/21/2018

## 2018-12-22 ENCOUNTER — Telehealth: Payer: Self-pay

## 2018-12-22 NOTE — Telephone Encounter (Signed)
MyChart message sent with results.  

## 2018-12-24 ENCOUNTER — Telehealth: Payer: Self-pay

## 2018-12-24 NOTE — Telephone Encounter (Signed)
Wife called, reports patient had 2 episodes of diarrhea last night. This morning both arms are shaking and have "red blotches". Wife states patient experienced same symptoms the last time he was septic. Temperature unknown.  Advised wife to take patient to ER for assessment/treatment. Wife verbalized understanding.

## 2018-12-26 ENCOUNTER — Other Ambulatory Visit: Payer: Self-pay | Admitting: Family Medicine

## 2018-12-26 NOTE — Telephone Encounter (Signed)
Noted  

## 2018-12-27 NOTE — Telephone Encounter (Signed)
RF request for hydralazine.  Last OV 12/21/2018 Last RX sent 06/15/2018 Unsure of how to send w/ instructions stating TID but patient takes 1 QD.  Please advise.

## 2018-12-30 ENCOUNTER — Telehealth: Payer: Self-pay

## 2018-12-30 MED ORDER — LISINOPRIL-HYDROCHLOROTHIAZIDE 20-12.5 MG PO TABS: 1.0000 | ORAL_TABLET | Freq: Every day | ORAL | 1 refills | Status: DC

## 2018-12-30 NOTE — Telephone Encounter (Signed)
Received fax from pt's pharmacy that all drugs in this class are on recall. They have Telmisartan but supply is limited. Switch to different class or Rx for Telmisartan.  Please advise, thanks.

## 2018-12-30 NOTE — Telephone Encounter (Signed)
I know which medication you are talking about, but this message has NO MED NAME attached to it at all.  How did that happen?  (ACE-I not on allergy list and no prob with ACE-I noted in PMH.  Review of past meds show NO ACE-I's present.).  I sent in rx for lisinopril/hct 20-12.5, 1 tab qd, #90, RF x 1.

## 2019-01-02 ENCOUNTER — Encounter: Payer: Self-pay | Admitting: Family Medicine

## 2019-01-03 NOTE — Telephone Encounter (Signed)
MyChart message sent this morning advising patient of new medication being sent.

## 2019-01-04 ENCOUNTER — Encounter: Payer: Self-pay | Admitting: Family Medicine

## 2019-01-05 NOTE — Telephone Encounter (Signed)
I recommend he take the lisinopril since the olmesartan has been recalled.-thx

## 2019-01-30 ENCOUNTER — Other Ambulatory Visit: Payer: Self-pay | Admitting: Family Medicine

## 2019-03-22 DIAGNOSIS — Z23 Encounter for immunization: Secondary | ICD-10-CM | POA: Diagnosis not present

## 2019-04-18 ENCOUNTER — Other Ambulatory Visit: Payer: Self-pay | Admitting: Family Medicine

## 2019-05-08 ENCOUNTER — Other Ambulatory Visit: Payer: Self-pay | Admitting: Family Medicine

## 2019-05-15 ENCOUNTER — Other Ambulatory Visit: Payer: Self-pay | Admitting: Family Medicine

## 2019-06-03 ENCOUNTER — Other Ambulatory Visit: Payer: Self-pay | Admitting: Family Medicine

## 2019-06-09 ENCOUNTER — Other Ambulatory Visit: Payer: Self-pay | Admitting: Family Medicine

## 2019-06-23 ENCOUNTER — Other Ambulatory Visit: Payer: Self-pay | Admitting: Family Medicine

## 2019-06-27 NOTE — Telephone Encounter (Signed)
Pt has enough to last until appt tomorrow.

## 2019-06-27 NOTE — Telephone Encounter (Signed)
RF request for Hydralazine LOV:12/21/18 Next ov: 06/28/19 Last written:12/27/18 (90,1)  Please Advise. Medication pending

## 2019-06-28 ENCOUNTER — Other Ambulatory Visit: Payer: Self-pay

## 2019-06-28 ENCOUNTER — Encounter: Payer: Self-pay | Admitting: Family Medicine

## 2019-06-28 ENCOUNTER — Ambulatory Visit (INDEPENDENT_AMBULATORY_CARE_PROVIDER_SITE_OTHER): Payer: Medicare Other | Admitting: Family Medicine

## 2019-06-28 VITALS — BP 120/75 | HR 62 | Wt 184.0 lb

## 2019-06-28 DIAGNOSIS — N184 Chronic kidney disease, stage 4 (severe): Secondary | ICD-10-CM

## 2019-06-28 DIAGNOSIS — Z79899 Other long term (current) drug therapy: Secondary | ICD-10-CM | POA: Diagnosis not present

## 2019-06-28 DIAGNOSIS — I10 Essential (primary) hypertension: Secondary | ICD-10-CM

## 2019-06-28 DIAGNOSIS — E119 Type 2 diabetes mellitus without complications: Secondary | ICD-10-CM

## 2019-06-28 NOTE — Progress Notes (Signed)
Virtual Visit via Video Note  I connected with pt on 06/28/19 at 10:30 AM EST by telephone (a video enabled telemedicine application was not an option for this pt) and verified that I am speaking with the correct person using two identifiers.  Location patient: home Location provider:work or home office Persons participating in the virtual visit: patient, pt's wife, provider  I discussed the limitations of evaluation and management by telemedicine and the availability of in person appointments. The patient expressed understanding and agreed to proceed.  Telemedicine visit is a necessity given the COVID-19 restrictions in place at the current time.  HPI: 85 y/o WM being seen today for 6 mo f/u diet controlled DM 2, HTN, and CRI with GFR in the 20s (medical renal dz + obstructive uropathy).    A/P as of last visit: "1) HTN: good control per home monitoring since getting back on toprol xl 50mg  qd. Continue all current meds at current doses.  BMET today.  2) DM 2, diet controlled: Hba1c today.  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 Avoid NSAIDs."  Interim hx: Feeling excellent! Daily bp's <130/80, P 60 avg. Eating better, walking more, has lost some wt purposefully--now at 184 lbs (down 7 lbs since last visit). Eating lots of veggies and meats, 4-5 small meals a day, small portion size. Fluid intake excellent.   ROS: See pertinent positives and negatives per HPI.  Past Medical History:  Diagnosis Date  . 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 Dois Davenport (urol).  Dr. Jeffie Pollock suspects this was due to stretch injury and has resolved as of 09/24/16.  . Arthritis of right wrist    s/p fracture of middle third of scaphoid bone sustained cleaning up from [facility] injection by Dr. Napoleon Form in [place] (ortho) made the pain go away.  Marland Kitchen BPH with obstruction/lower urinary tract symptoms    hx of urinary retention  (s/p foley cath when in hosp for urosepsis 04/2016).  TURP will not help since he has acontractile bladder.  Options are CIC, indwelling urethral foley, or suprapubic catheter.  Has urethral foley as of 07/2016.  Marland Kitchen Chronic low back pain   . Chronic renal insufficiency, stage III (moderate)    CrCl 30s (saw nephrologist in [place]). +microalbuminuria 04/2014 at PCP in [place].  Borderline III/IV 2018: GFR upper 20s.  Marland Kitchen COPD (chronic obstructive pulmonary disease) ([place])    on CXR 09/27/2015  . Diabetes mellitus ([place])    2015 HbA1c 6.2-6.3 %.  HbA1c 06/2016 5.4%.  A1c 6.5% 05/2017 (this was his first A1c of 6.5% or more)--nutritionist referral recommended.  . Diverticulosis 04/2016   Noted on CT 04/2016  . Elevated PSA 06/2016   Dr. Redmond Pulling ([facility] urologist)-awaiting records (pt's wife reported pt's psa was 7).  Dr. Jeffie Pollock feels like this may have been a result of relatively recent episode of urinary retention, recheck PSA 12/29/16 was lower: 4.64, 24% f/t ratio, plan recheck 1 yr (Dr. Jeffie Pollock).   . History of anemia due to chronic kidney disease   . History of pyelonephritis 04/2016  . History of sepsis 04/2016   e coli  . Hypertension   . Kidney stones    one episode  . Recurrent UTI 2017  . 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 (04/2016 CT)  . Urge incontinence     Past Surgical History:  Procedure Laterality Date  . TONSILLECTOMY  1940  .  VENTRAL HERNIA REPAIR  2010   with strangulation of SB (approx 1 foot of SB had to be excised).  Had 3 total surgeries for this due to complications    Family History  Problem Relation Age of Onset  . Breast cancer Mother   . Diabetes Mother     SOCIAL HX:  Social History   Socioeconomic History  . Marital status: Married    Spouse name: Not on file  . Number of children: Not on file  . Years of education: Not on file  . Highest education level: Not on file  Occupational History  . Not on file   Tobacco Use  . Smoking status: Former Smoker    Packs/day: 0.25    Years: 5.00    Pack years: 1.25    Types: Cigarettes    Quit date: 06/30/1978    Years since quitting: 41.0  . Smokeless tobacco: Never Used  Substance and Sexual Activity  . Alcohol use: No  . Drug use: No  . Sexual activity: Not on file  Other Topics Concern  . Not on file  Social History Narrative   Married, has 2 step daughters and 3 biologic children.  Has some grandchildren.   Relocated from [place] to [facility] 08/2013.   Retired [employer].   No T/A/Ds.   Social Determinants of Health   Financial Resource Strain:   . Difficulty of Paying Living Expenses: Not on file  Food Insecurity:   . Worried About Charity fundraiser in the Last Year: Not on file  . Ran Out of Food in the Last Year: Not on file  Transportation Needs:   . Lack of Transportation (Medical): Not on file  . Lack of Transportation (Non-Medical): Not on file  Physical Activity:   . Days of Exercise per Week: Not on file  . Minutes of Exercise per Session: Not on file  Stress:   . Feeling of Stress : Not on file  Social Connections:   . Frequency of Communication with Friends and Family: Not on file  . Frequency of Social Gatherings with Friends and Family: Not on file  . Attends Religious Services: Not on file  . Active Member of Clubs or Organizations: Not on file  . Attends Archivist Meetings: Not on file  . Marital Status: Not on file      Current Outpatient Medications:  .  doxazosin (CARDURA) 8 MG tablet, Take 1 tablet by mouth once daily, Disp: 90 tablet, Rfl: 0 .  finasteride (PROSCAR) 5 MG tablet, Take 1 tablet by mouth once daily, Disp: 90 tablet, Rfl: 0 .  hydrALAZINE (APRESOLINE) 10 MG tablet, 1 tab po qd, Disp: 90 tablet, Rfl: 1 .  lisinopril-hydrochlorothiazide (ZESTORETIC) 20-12.5 MG tablet, Take 1 tablet by mouth once daily, Disp: 90 tablet, Rfl: 0 .  metoprolol succinate (TOPROL-XL) 50 MG 24 hr  tablet, TAKE 1 TABLET BY MOUTH ONCE DAILY WITH  OR  IMMEDIATELY  FOLLOWING  A  MEAL, Disp: 90 tablet, Rfl: 0 .  Multiple Vitamin (MULTIVITAMIN) tablet, Take 1 tablet by mouth daily., Disp: , Rfl:  .  oxybutynin (DITROPAN-XL) 5 MG 24 hr tablet, TAKE 1 TABLET BY MOUTH AT BEDTIME, Disp: 90 tablet, Rfl: 0 .  cyclobenzaprine (FLEXERIL) 5 MG tablet, 1 tab po bid prn neck pain (Patient not taking: Reported on 06/28/2019), Disp: 30 tablet, Rfl: 1 .  vitamin B-12 (CYANOCOBALAMIN) 1000 MCG tablet, Take 1,000 mcg by mouth every other day., Disp: , Rfl:  EXAM:  VITALS per patient if applicable: BP 0000000   Pulse 62   Wt 184 lb (83.5 kg)   BMI 29.25 kg/m  No further exam b/c audio visit only.   LABS: none today.    Chemistry      Component Value Date/Time   NA 140 12/21/2018 1119   K 4.7 12/21/2018 1119   CL 102 12/21/2018 1119   CO2 32 12/21/2018 1119   BUN 32 (H) 12/21/2018 1119   CREATININE 2.55 (H) 12/21/2018 1119      Component Value Date/Time   CALCIUM 9.3 12/21/2018 1119   ALKPHOS 68 12/21/2018 1119   AST 11 12/21/2018 1119   ALT 11 12/21/2018 1119   BILITOT 0.8 12/21/2018 1119     Lab Results  Component Value Date   HGBA1C 6.3 12/21/2018   Lab Results  Component Value Date   WBC 7.9 06/16/2017   HGB 14.7 06/16/2017   HCT 44.7 06/16/2017   MCV 95.7 06/16/2017   PLT 157.0 06/16/2017   No results found for: CHOL, HDL, LDLCALC, LDLDIRECT, TRIG, CHOLHDL  ASSESSMENT AND PLAN:  Discussed the following assessment and plan:  1) HTN: The current medical regimen is effective;  continue present plan and medications. Future BMET at his earliest convenience.  2) DM 2: diet controlled. Doing well with diet, exercise, purposeful wt loss. Hba1c future.  3) CRI IV: hydrates well and avoids NSAIDs. BMET future.  4) Polypharmacy: we discussed recent studies showing lack of good evidence to use aspirin for primary cv prevention.  Risk of daily ASA outweighs benefits for this  pt-->decided to d/c ASA today.  Spent 15 min with pt today, with >50% of this time spent in counseling and care coordination regarding the above problems.  -we discussed possible serious and likely etiologies, options for evaluation and workup, limitations of telemedicine visit vs in person visit, treatment, treatment risks and precautions. Pt prefers to treat via telemedicine empirically rather then risking or undertaking an in person visit at this moment. Patient agrees to seek prompt in person care if worsening, new symptoms arise, or if is not improving with treatment.   I discussed the assessment and treatment plan with the patient. The patient was provided an opportunity to ask questions and all were answered. The patient agreed with the plan and demonstrated an understanding of the instructions.   The patient was advised to call back or seek an in-person evaluation if the symptoms worsen or if the condition fails to improve as anticipated.  F/u: 6 mo RCI, lab visit at his convenience   Signed:  Crissie Sickles, MD           06/28/2019

## 2019-07-12 ENCOUNTER — Encounter: Payer: Self-pay | Admitting: Family Medicine

## 2019-07-12 ENCOUNTER — Ambulatory Visit (INDEPENDENT_AMBULATORY_CARE_PROVIDER_SITE_OTHER): Payer: Medicare Other | Admitting: Family Medicine

## 2019-07-12 ENCOUNTER — Other Ambulatory Visit: Payer: Self-pay

## 2019-07-12 DIAGNOSIS — N184 Chronic kidney disease, stage 4 (severe): Secondary | ICD-10-CM | POA: Diagnosis not present

## 2019-07-12 DIAGNOSIS — E119 Type 2 diabetes mellitus without complications: Secondary | ICD-10-CM | POA: Diagnosis not present

## 2019-07-12 DIAGNOSIS — I10 Essential (primary) hypertension: Secondary | ICD-10-CM

## 2019-07-12 LAB — CBC WITH DIFFERENTIAL/PLATELET
Basophils Absolute: 0 10*3/uL (ref 0.0–0.1)
Basophils Relative: 0.4 % (ref 0.0–3.0)
Eosinophils Absolute: 0.3 10*3/uL (ref 0.0–0.7)
Eosinophils Relative: 3.4 % (ref 0.0–5.0)
HCT: 45.7 % (ref 39.0–52.0)
Hemoglobin: 15.3 g/dL (ref 13.0–17.0)
Lymphocytes Relative: 23.5 % (ref 12.0–46.0)
Lymphs Abs: 2.1 10*3/uL (ref 0.7–4.0)
MCHC: 33.5 g/dL (ref 30.0–36.0)
MCV: 94.4 fl (ref 78.0–100.0)
Monocytes Absolute: 0.4 10*3/uL (ref 0.1–1.0)
Monocytes Relative: 4.6 % (ref 3.0–12.0)
Neutro Abs: 6.1 10*3/uL (ref 1.4–7.7)
Neutrophils Relative %: 68.1 % (ref 43.0–77.0)
Platelets: 161 10*3/uL (ref 150.0–400.0)
RBC: 4.84 Mil/uL (ref 4.22–5.81)
RDW: 13.5 % (ref 11.5–15.5)
WBC: 8.9 10*3/uL (ref 4.0–10.5)

## 2019-07-12 LAB — BASIC METABOLIC PANEL
BUN: 35 mg/dL — ABNORMAL HIGH (ref 6–23)
CO2: 31 mEq/L (ref 19–32)
Calcium: 9.7 mg/dL (ref 8.4–10.5)
Chloride: 102 mEq/L (ref 96–112)
Creatinine, Ser: 2.85 mg/dL — ABNORMAL HIGH (ref 0.40–1.50)
GFR: 21.19 mL/min — ABNORMAL LOW (ref >60.00)
Glucose, Bld: 112 mg/dL — ABNORMAL HIGH (ref 70–99)
Potassium: 4.5 mEq/L (ref 3.5–5.1)
Sodium: 142 mEq/L (ref 135–145)

## 2019-07-12 LAB — LIPID PANEL
Cholesterol: 178 mg/dL (ref 0–200)
HDL: 41.4 mg/dL (ref >39.00)
LDL Cholesterol: 106 mg/dL — ABNORMAL HIGH (ref 0–99)
NonHDL: 136.87
Total CHOL/HDL Ratio: 4
Triglycerides: 154 mg/dL — ABNORMAL HIGH (ref 0.0–149.0)
VLDL: 30.8 mg/dL (ref 0.0–40.0)

## 2019-07-12 LAB — HEMOGLOBIN A1C: Hgb A1c MFr Bld: 6.1 % (ref 4.6–6.5)

## 2019-07-17 ENCOUNTER — Other Ambulatory Visit: Payer: Self-pay | Admitting: Family Medicine

## 2019-08-02 ENCOUNTER — Other Ambulatory Visit: Payer: Self-pay | Admitting: Family Medicine

## 2019-08-10 ENCOUNTER — Other Ambulatory Visit: Payer: Self-pay

## 2019-08-10 ENCOUNTER — Ambulatory Visit (INDEPENDENT_AMBULATORY_CARE_PROVIDER_SITE_OTHER): Payer: Medicare Other | Admitting: Family Medicine

## 2019-08-10 ENCOUNTER — Encounter: Payer: Self-pay | Admitting: Family Medicine

## 2019-08-10 ENCOUNTER — Telehealth: Payer: Self-pay

## 2019-08-10 VITALS — BP 165/116 | HR 120 | Temp 98.0°F | Resp 18 | Ht 66.5 in

## 2019-08-10 DIAGNOSIS — R109 Unspecified abdominal pain: Secondary | ICD-10-CM

## 2019-08-10 DIAGNOSIS — Z8719 Personal history of other diseases of the digestive system: Secondary | ICD-10-CM | POA: Diagnosis not present

## 2019-08-10 DIAGNOSIS — K5790 Diverticulosis of intestine, part unspecified, without perforation or abscess without bleeding: Secondary | ICD-10-CM | POA: Diagnosis not present

## 2019-08-10 DIAGNOSIS — N184 Chronic kidney disease, stage 4 (severe): Secondary | ICD-10-CM | POA: Diagnosis not present

## 2019-08-10 DIAGNOSIS — K5900 Constipation, unspecified: Secondary | ICD-10-CM | POA: Diagnosis not present

## 2019-08-10 MED ORDER — POLYETHYLENE GLYCOL 3350 17 GM/SCOOP PO POWD: 17.0000 g | Freq: Two times a day (BID) | ORAL | 1 refills | Status: DC

## 2019-08-10 MED ORDER — SENNOSIDES-DOCUSATE SODIUM 8.6-50 MG PO TABS: 2.0000 | ORAL_TABLET | Freq: Every day | ORAL | 0 refills | Status: DC

## 2019-08-10 MED ORDER — BISACODYL 10 MG RE SUPP: 10.0000 mg | RECTAL | 0 refills | Status: DC | PRN

## 2019-08-10 NOTE — Progress Notes (Signed)
This visit occurred during the SARS-CoV-2 public health emergency.  Safety protocols were in place, including screening questions prior to the visit, additional usage of staff PPE, and extensive cleaning of exam room while observing appropriate contact time as indicated for disinfecting solutions.    Gary Morgan , April 26, 1932, 86 y.o., male MRN: [id number] Patient Care Team    Relationship Specialty Notifications Start End  McGowen, Adrian Blackwater, MD PCP - General Family Medicine  11/30/14   Irine Seal, MD Consulting Physician Urology  10/01/16     Chief Complaint  Patient presents with  . Constipation    Pt has not had a BM in 2 days. ABD pain.     Subjective: Pt presents for an OV with his daughter with complaints of constipation of 2 days duration.  Associated symptoms include abdominal pain, which he describes as a pressure.  Patient reports he has not passed much gas in the last 2 days either.  He does endorse having a very small bowel movement this morning that was normal in consistency and color.  He reports he has taken bottle of magnesium citrate and a sodium phosphorus enema.  Patient has stage IV chronic kidney disease.  He has also taken Dulcolax stool softeners by mouth.  He reports he gets constipated about once a month and it usually resolves with use of stool softeners. Patient has a significant medical history of stage IV chronic kidney disease, severe BPH and diabetes. He has never had a colon cancer screening.  Depression screen [facility] 2/9 11/30/2018 06/15/2018 06/16/2017 07/07/2016 11/30/2014  Decreased Interest 0 0 0 0 0  Down, Depressed, Hopeless 0 0 0 0 0  PHQ - 2 Score 0 0 0 0 0    Allergies  Allergen Reactions  . Amlodipine Other (See Comments)    Disequilibrium, generalized weakness.   Social History   Social History Narrative   Married, has 2 step daughters and 3 biologic children.  Has some grandchildren.   Relocated from [place] to [facility] 08/2013.   Retired  [employer].   No T/A/Ds.   Past Medical History:  Diagnosis Date  . 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 Dois Davenport (urol).  Dr. Jeffie Pollock suspects this was due to stretch injury and has resolved as of 09/24/16.  . Arthritis of right wrist    s/p fracture of middle third of scaphoid bone sustained cleaning up from [facility] injection by Dr. Napoleon Form in [place] (ortho) made the pain go away.  Marland Kitchen BPH with obstruction/lower urinary tract symptoms    hx of urinary retention (s/p foley cath when in hosp for urosepsis 04/2016).  TURP will not help since he has acontractile bladder.  Options are CIC, indwelling urethral foley, or suprapubic catheter.  Has urethral foley as of 07/2016.  Marland Kitchen Chronic low back pain   . Chronic renal insufficiency, stage 4 (severe) (H[facility])    CrCl 30s (saw nephrologist in [place]). +microalbuminuria 04/2014 at PCP in [place].  Stage IV 2018: GFR upper 20s.  Marland Kitchen COPD (chronic obstructive pulmonary disease) ([place])    on CXR 09/27/2015  . Diabetes mellitus ([place])    2015 HbA1c 6.2-6.3 %.  HbA1c 06/2016 5.4%.  A1c 6.5% 05/2017 (this was his first A1c of 6.5% or more)--nutritionist referral recommended.  . Diverticulosis 04/2016   Noted on CT 04/2016  . Elevated PSA 06/2016   Dr. Redmond Pulling ([facility] urologist)-awaiting records (pt's wife reported pt's psa was 7).  Dr. Jeffie Pollock feels like this  may have been a result of relatively recent episode of urinary retention, recheck PSA 12/29/16 was lower: 4.64, 24% f/t ratio, plan recheck 1 yr (Dr. Jeffie Pollock).   . History of anemia due to chronic kidney disease   . History of pyelonephritis 04/2016  . History of sepsis 04/2016   e coli  . Hypertension   . Kidney stones    one episode  . Recurrent UTI 2017  . 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 (04/2016 CT)  . Urge incontinence    Past Surgical History:  Procedure Laterality Date  . TONSILLECTOMY  1940  .  VENTRAL HERNIA REPAIR  2010   with strangulation of SB (approx 1 foot of SB had to be excised).  Had 3 total surgeries for this due to complications   Family History  Problem Relation Age of Onset  . Breast cancer Mother   . Diabetes Mother    Allergies as of 08/10/2019      Reactions   Amlodipine Other (See Comments)   Disequilibrium, generalized weakness.      Medication List       Accurate as of August 10, 2019  7:13 PM. If you have any questions, ask your nurse or doctor.        bisacodyl 10 MG suppository Commonly known as: DULCOLAX Place 1 suppository (10 mg total) rectally as needed for moderate constipation. Started by: Howard Pouch, DO   cyclobenzaprine 5 MG tablet Commonly known as: FLEXERIL 1 tab po bid prn neck pain   doxazosin 8 MG tablet Commonly known as: CARDURA Take 1 tablet by mouth once daily   finasteride 5 MG tablet Commonly known as: PROSCAR Take 1 tablet by mouth once daily   hydrALAZINE 10 MG tablet Commonly known as: APRESOLINE Take 1 tablet by mouth once daily   lisinopril-hydrochlorothiazide 20-12.5 MG tablet Commonly known as: ZESTORETIC Take 1 tablet by mouth once daily   metoprolol succinate 50 MG 24 hr tablet Commonly known as: TOPROL-XL TAKE 1 TABLET BY MOUTH ONCE DAILY WITH MEALS OR  IMMEDIATELY  FOLLOWING  A  MEAL   multivitamin tablet Take 1 tablet by mouth daily.   oxybutynin 5 MG 24 hr tablet Commonly known as: DITROPAN-XL TAKE 1 TABLET BY MOUTH AT BEDTIME   polyethylene glycol powder 17 GM/SCOOP powder Commonly known as: GLYCOLAX/MIRALAX Take 17 g by mouth 2 (two) times daily. Started by: Howard Pouch, DO   senna-docusate 8.6-50 MG tablet Commonly known as: Senokot-S Take 2 tablets by mouth daily. Started by: Howard Pouch, DO   vitamin B-12 1000 MCG tablet Commonly known as: CYANOCOBALAMIN Take 1,000 mcg by mouth every other day.       All past medical history, surgical history, allergies, family history,  immunizations andmedications were updated in the EMR today and reviewed under the history and medication portions of their EMR.     ROS: Negative, with the exception of above mentioned in HPI   Objective:  BP (!) 165/116 (BP Location: Left Arm, Patient Position: Sitting, Cuff Size: Normal)   Pulse (!) 120   Temp 98 F (36.7 C) (Temporal)   Resp 18   Ht 5' 6.5" (1.689 m)   SpO2 94%   BMI 29.25 kg/m  Body mass index is 29.25 kg/m. Gen: Afebrile. No acute distress.  Appears uncomfortable.  Very pleasant overweight Caucasian male. HENT: AT. [place].  Eyes:Pupils Equal Round Reactive to light, Extraocular movements intact,  Conjunctiva without redness, discharge  or icterus. CV: RRR, no edema Chest: CTAB, no wheeze or crackles. Good air movement, normal resp effort.  Abd: Soft.  Mildly distended.  Tender to palpation left upper quadrant and left lower quadrant. BS hypoactive-blood present. No rebound or guarding.  Moderate stool burden palpated.   Skin: No skin changes present over abdomen.  Neuro: In a wheelchair today.  PERLA. EOMi. Alert. Oriented x3  Psych: Normal affect, dress and demeanor. Normal speech. Normal thought content and judgment.  No exam data present No results found. No results found for this or any previous visit (from the past 24 hour(s)).  Assessment/Plan: Jomel Papworth is a 86 y.o. male present for OV for  Constipation, unspecified constipation type/History of diverticulosisAbdominal discomfort -Patient appears rather uncomfortable with elevated blood pressure and heart rate today.  Discussed outpatient options for work-up with emergent precautions strongly advised to them. -Start Senokot 2 tabs tonight and repeat in the morning. -Start MiraLAX 17 g in 8 ounces of water this evening, then twice daily until bowel movements are routine. -Start Dulcolax suppository this evening.  May repeat daily until bowel movements are routine. -Encouraged him to hydrate. - CBC  w/Diff - C-reactive protein - Renal Function Panel - Magnesium -Follow-up dependent upon lab results and clinical outcome.  If able to keep in the outpatient setting, close follow-up with his PCP in 1 week.  If labs are abnormal may need to send to emergency room versus antibiotic treatment and CT.  CKD (chronic kidney disease) stage 4, GFR 15-29 ml/min (HCC) -Discussed with him and his daughter, in the future would attempt to avoid use of magnesium products and enemas with constipation if able.  They report understanding. -Other safer options were discussed with him today (see above). - Renal Function Panel - Magnesium  Emergent follow-up and precautions was explained to him and his daughter today.  Although he was very pleasant and in good spirits, he was obviously uncomfortable with an elevated blood pressure and heart rate clearly above his normal.  I do have concerns his symptoms could require more emergent intervention, but they obviously want to stay out of the emergency room if at all possible.  If any fevers, chills, nausea, vomit, blood per rectum or if pain worsens>> they understand to report to the emergency room immediately.  He has never had a colonoscopy and has a history of diverticulosis by CT in 2017.  They reported understanding that if he endorses any of the above signs/symptoms or he is still unable to have a bowel movement by tomorrow morning, after starting this regimen, would encourage them to report to the emergency room for further evaluation.   Reviewed expectations re: course of current medical issues.  Discussed self-management of symptoms.  Outlined signs and symptoms indicating need for more acute intervention.  Patient verbalized understanding and all questions were answered.  Patient received an After-Visit Summary.    Orders Placed This Encounter  Procedures  . CBC w/Diff  . C-reactive protein  . Renal Function Panel  . Magnesium   Meds ordered this  encounter  Medications  . senna-docusate (SENOKOT-S) 8.6-50 MG tablet    Sig: Take 2 tablets by mouth daily.    Dispense:  60 tablet    Refill:  0  . polyethylene glycol powder (GLYCOLAX/MIRALAX) 17 GM/SCOOP powder    Sig: Take 17 g by mouth 2 (two) times daily.    Dispense:  850 g    Refill:  1  . bisacodyl (DULCOLAX) 10 MG  suppository    Sig: Place 1 suppository (10 mg total) rectally as needed for moderate constipation.    Dispense:  12 suppository    Refill:  0      Note is dictated utilizing voice recognition software. Although note has been proof read prior to signing, occasional typographical errors still can be missed. If any questions arise, please do not hesitate to call for verification.   electronically signed by:  Howard Pouch, DO  [place]

## 2019-08-10 NOTE — Telephone Encounter (Signed)
Last 2 days he has been constipated. Denies fever. Has tried Mag citrate (whole bottle), stool softeners, and used half enema.  Had pain last night but has no pain today. Pt had BM 2 days ago. A little stool came out today but not a lot. Pt believes Prostate is enlarged, takes medications for this. Wife was going to call daughter to see if she could bring patient in at 3:30. Wife and pt got upset because they just wanted something called into the pharmacy. They were told this was something that would have to be seen for and we could not do this over the telephone.

## 2019-08-10 NOTE — Telephone Encounter (Signed)
Patient wife calling, verified PHI; reports patient is very constipated. They have tried various types of OTC meds and have not helped.  Please advise. [name] can be contacted at [phone]

## 2019-08-10 NOTE — Telephone Encounter (Signed)
Pts wife was going to call daughter and ask her to bring him to a 3:30 appt. Appt was made.

## 2019-08-10 NOTE — Patient Instructions (Signed)
Start 2 tabs of senna tonight and repeat once daily until regular movements. This is ok to take 1-2 tabs daily to maintain regular stools.   Miralax 1 cap today and then 1 cap twice a day until routine BM. This ok to take 1 cap daily if needed.   Use the suppository today. Can repeat once a day until routine BM.   If pain worsens, fever, chills, nausea, vomit, bloody stools or no bowel movement (BM) by tomorrow>> go to ED.   We will call you with lab results. We are checking for infection and inflammation in the bowels. We are also making sure your kidneys are ok.  With your kidney function avoid use of magnesium or phosphorous based enemas. Ok to use a "soap sud" enema.   The soapsuds enema uses a mixture of a mild soap and warm water injected into the colon in order to stimulate a bowel movement. Normally given to relieve constipation or for bowel cleansing before a medical examination or procedure. Contrary to what the name implies, soapsuds are not necessary for the enema to be effective. It is the presence of soap in the solution that increases the effectiveness of the enema. For adults the normal volume given is 500cc to 1500cc (one to three pints). Castile soap, which is a mild, vegetable oil based soap is commonly used. For liquid Castile soap, approximately one teaspoon per 1000cc (quart) of water is used. Castile bar soap, and other bar soap with minimal additives such as [place] may be used, however these present difficulty in judging the approriate amount of soap to add to the solution. In general, add no more than is required to turn the water a slightly milky color. Liquid handsoaps and detergents should not be used. The large volume of water in the enema causes the colon to expand, which stimulates involuntary contractions of the colon (peristalsis). The soap has an irritating effect on the colon, which also contributes to peristalsis, resulting in a more effective enema than tap water  alone. In order for the enema to be the effective the patient should retain the solution for five to ten minutes, if possible. Although a retention period is necessary for the enema to be fully effective, the soapsuds enema is not classified as a retention enema, but is usually referred to as a cleansing or evacuant enema. The patient normally lies on their left side to receive the enema. If the patient is in good health and mobile, the knee chest position may also be used. The solution is typically given in an enema bag or bucket, although a bulb syringe may also be used. The enema tube or nozzle should be lubricated and then inserted two to four inches into the patient's rectum. The bag or bucket is raised appoximately 18 inches above the patients hips and the solution is administered slowly over a period of minutes. If a bulb syringe is used, the bulb should be squeezed slowly while injecting the solution. Approximately one half hour should be set aside when administering the enema. Allow at least five minutes for administration, a retention period of five to ten minutes, with the resulting bowel movement taking ten to fifteen minutes. If given to relieve constipation, one enema is usually sufficient. Some medical procedures call for several enemas to be administered in order to completely clear the colon. If more than one enema is required, the soapsuds enema should be given first, and the following enemas normally contain plain tap water. More  than one soapsuds enema should not be given unless ordered by a doctor. Impaction is a serious form of constipation which in extreme cases may be lifethreatening. While the soapsuds enema may be used in a home setting to relieve occasional constipation, medical care may be required for more severe cases of constipation, or for recurring constipation

## 2019-08-11 ENCOUNTER — Telehealth: Payer: Self-pay | Admitting: Family Medicine

## 2019-08-11 LAB — CBC WITH DIFFERENTIAL/PLATELET
Absolute Monocytes: 554 cells/uL (ref 200–950)
Basophils Absolute: 25 cells/uL (ref 0–200)
Basophils Relative: 0.2 %
Eosinophils Absolute: 37 cells/uL (ref 15–500)
Eosinophils Relative: 0.3 %
HCT: 47.4 % (ref 38.5–50.0)
Hemoglobin: 16.5 g/dL (ref 13.2–17.1)
Lymphs Abs: 1451 cells/uL (ref 850–3900)
MCH: 31.8 pg (ref 27.0–33.0)
MCHC: 34.8 g/dL (ref 32.0–36.0)
MCV: 91.3 fL (ref 80.0–100.0)
MPV: 10.3 fL (ref 7.5–12.5)
Monocytes Relative: 4.5 %
Neutro Abs: 10234 cells/uL — ABNORMAL HIGH (ref 1500–7800)
Neutrophils Relative %: 83.2 %
Platelets: 156 10*3/uL (ref 140–400)
RBC: 5.19 10*6/uL (ref 4.20–5.80)
RDW: 12.6 % (ref 11.0–15.0)
Total Lymphocyte: 11.8 %
WBC: 12.3 10*3/uL — ABNORMAL HIGH (ref 3.8–10.8)

## 2019-08-11 LAB — RENAL FUNCTION PANEL
Albumin: 4.3 g/dL (ref 3.6–5.1)
BUN/Creatinine Ratio: 13 (calc) (ref 6–22)
BUN: 34 mg/dL — ABNORMAL HIGH (ref 7–25)
CO2: 23 mmol/L (ref 20–32)
Calcium: 9.6 mg/dL (ref 8.6–10.3)
Chloride: 101 mmol/L (ref 98–110)
Creat: 2.67 mg/dL — ABNORMAL HIGH (ref 0.70–1.11)
Glucose, Bld: 164 mg/dL — ABNORMAL HIGH (ref 65–99)
Phosphorus: 2.7 mg/dL (ref 2.1–4.3)
Potassium: 4.4 mmol/L (ref 3.5–5.3)
Sodium: 139 mmol/L (ref 135–146)

## 2019-08-11 LAB — C-REACTIVE PROTEIN: CRP: 2.7 mg/L (ref <8.0)

## 2019-08-11 LAB — MAGNESIUM: Magnesium: 2.2 mg/dL (ref 1.5–2.5)

## 2019-08-11 MED ORDER — AMOXICILLIN-POT CLAVULANATE 500-125 MG PO TABS: 1.0000 | ORAL_TABLET | Freq: Two times a day (BID) | ORAL | 0 refills | Status: DC

## 2019-08-11 MED ORDER — AMOXICILLIN-POT CLAVULANATE 875-125 MG PO TABS: 1.0000 | ORAL_TABLET | Freq: Three times a day (TID) | ORAL | 0 refills | Status: DC

## 2019-08-11 NOTE — Telephone Encounter (Signed)
Pts wife was notified of results, she verbalized understanding. She will get daughter to call back to schedule since she will have to take off work to bring patient. Pharmacy was called and verify they received the correct RX.

## 2019-08-11 NOTE — Telephone Encounter (Signed)
Please inform patient's wife the following information: His kidney function is stable from collection last month. He does have an elevated white count, which likely means infection.  This can be from a diverticulitis versus colitis (inflammation of the colon).  I have called in an antibiotic that he needs to start immediately twice daily.  Follow-up with his PCP in 1 week-please schedule this.  If worsening pain, fever, chills, nausea, vomit, continued constipation or bleeding per rectum needs to be seen emergently in the ED for further evaluation and emergent abdominal scanning.  RB: please call pharmacy and make sure they canceled the prior is Augmentin called in this morning.  Correct prescription is the Augmentin 500 mg twice daily dose for 14 days.

## 2019-08-12 DIAGNOSIS — Z79899 Other long term (current) drug therapy: Secondary | ICD-10-CM | POA: Diagnosis not present

## 2019-08-12 DIAGNOSIS — R1084 Generalized abdominal pain: Secondary | ICD-10-CM | POA: Diagnosis not present

## 2019-08-12 DIAGNOSIS — Z888 Allergy status to other drugs, medicaments and biological substances status: Secondary | ICD-10-CM | POA: Diagnosis not present

## 2019-08-12 DIAGNOSIS — N281 Cyst of kidney, acquired: Secondary | ICD-10-CM | POA: Diagnosis not present

## 2019-08-12 DIAGNOSIS — N179 Acute kidney failure, unspecified: Secondary | ICD-10-CM | POA: Diagnosis not present

## 2019-08-12 DIAGNOSIS — N400 Enlarged prostate without lower urinary tract symptoms: Secondary | ICD-10-CM | POA: Diagnosis not present

## 2019-08-12 DIAGNOSIS — E1122 Type 2 diabetes mellitus with diabetic chronic kidney disease: Secondary | ICD-10-CM | POA: Diagnosis not present

## 2019-08-12 DIAGNOSIS — Z7982 Long term (current) use of aspirin: Secondary | ICD-10-CM | POA: Diagnosis not present

## 2019-08-12 DIAGNOSIS — R339 Retention of urine, unspecified: Secondary | ICD-10-CM | POA: Diagnosis not present

## 2019-08-12 DIAGNOSIS — K5900 Constipation, unspecified: Secondary | ICD-10-CM | POA: Diagnosis not present

## 2019-08-12 DIAGNOSIS — N184 Chronic kidney disease, stage 4 (severe): Secondary | ICD-10-CM | POA: Diagnosis not present

## 2019-08-12 DIAGNOSIS — R5381 Other malaise: Secondary | ICD-10-CM | POA: Diagnosis not present

## 2019-08-12 DIAGNOSIS — I129 Hypertensive chronic kidney disease with stage 1 through stage 4 chronic kidney disease, or unspecified chronic kidney disease: Secondary | ICD-10-CM | POA: Diagnosis not present

## 2019-08-12 DIAGNOSIS — Z87891 Personal history of nicotine dependence: Secondary | ICD-10-CM | POA: Diagnosis not present

## 2019-08-16 DIAGNOSIS — Z7982 Long term (current) use of aspirin: Secondary | ICD-10-CM | POA: Diagnosis not present

## 2019-08-16 DIAGNOSIS — Z79899 Other long term (current) drug therapy: Secondary | ICD-10-CM | POA: Diagnosis not present

## 2019-08-16 DIAGNOSIS — E1122 Type 2 diabetes mellitus with diabetic chronic kidney disease: Secondary | ICD-10-CM | POA: Diagnosis not present

## 2019-08-16 DIAGNOSIS — R319 Hematuria, unspecified: Secondary | ICD-10-CM | POA: Diagnosis not present

## 2019-08-16 DIAGNOSIS — Z888 Allergy status to other drugs, medicaments and biological substances status: Secondary | ICD-10-CM | POA: Diagnosis not present

## 2019-08-16 DIAGNOSIS — R339 Retention of urine, unspecified: Secondary | ICD-10-CM | POA: Diagnosis not present

## 2019-08-16 DIAGNOSIS — Z87891 Personal history of nicotine dependence: Secondary | ICD-10-CM | POA: Diagnosis not present

## 2019-08-16 DIAGNOSIS — I129 Hypertensive chronic kidney disease with stage 1 through stage 4 chronic kidney disease, or unspecified chronic kidney disease: Secondary | ICD-10-CM | POA: Diagnosis not present

## 2019-08-16 DIAGNOSIS — N184 Chronic kidney disease, stage 4 (severe): Secondary | ICD-10-CM | POA: Diagnosis not present

## 2019-08-18 ENCOUNTER — Ambulatory Visit: Payer: Medicare Other | Admitting: Family Medicine

## 2019-08-21 ENCOUNTER — Other Ambulatory Visit: Payer: Self-pay | Admitting: Family Medicine

## 2019-09-04 ENCOUNTER — Other Ambulatory Visit: Payer: Self-pay | Admitting: Family Medicine

## 2019-09-07 DIAGNOSIS — N3000 Acute cystitis without hematuria: Secondary | ICD-10-CM | POA: Diagnosis not present

## 2019-09-07 DIAGNOSIS — N401 Enlarged prostate with lower urinary tract symptoms: Secondary | ICD-10-CM | POA: Diagnosis not present

## 2019-09-07 DIAGNOSIS — R338 Other retention of urine: Secondary | ICD-10-CM | POA: Diagnosis not present

## 2019-09-26 ENCOUNTER — Other Ambulatory Visit: Payer: Self-pay | Admitting: Family Medicine

## 2019-10-03 ENCOUNTER — Encounter: Payer: Self-pay | Admitting: Family Medicine

## 2019-10-12 DIAGNOSIS — R338 Other retention of urine: Secondary | ICD-10-CM | POA: Diagnosis not present

## 2019-10-12 DIAGNOSIS — N401 Enlarged prostate with lower urinary tract symptoms: Secondary | ICD-10-CM | POA: Diagnosis not present

## 2019-11-07 ENCOUNTER — Ambulatory Visit (INDEPENDENT_AMBULATORY_CARE_PROVIDER_SITE_OTHER): Payer: Medicare Other | Admitting: Urology

## 2019-11-07 ENCOUNTER — Other Ambulatory Visit: Payer: Self-pay

## 2019-11-07 ENCOUNTER — Encounter: Payer: Self-pay | Admitting: Urology

## 2019-11-07 VITALS — BP 170/80 | HR 60 | Wt 181.0 lb

## 2019-11-07 DIAGNOSIS — N401 Enlarged prostate with lower urinary tract symptoms: Secondary | ICD-10-CM

## 2019-11-07 DIAGNOSIS — N390 Urinary tract infection, site not specified: Secondary | ICD-10-CM

## 2019-11-07 DIAGNOSIS — R338 Other retention of urine: Secondary | ICD-10-CM | POA: Diagnosis not present

## 2019-11-07 DIAGNOSIS — N138 Other obstructive and reflux uropathy: Secondary | ICD-10-CM

## 2019-11-07 LAB — URINALYSIS, COMPLETE
Bilirubin, UA: NEGATIVE
Glucose, UA: NEGATIVE
Ketones, UA: NEGATIVE
Leukocytes,UA: NEGATIVE
Nitrite, UA: NEGATIVE
Protein,UA: NEGATIVE
RBC, UA: NEGATIVE
Specific Gravity, UA: 1.02 (ref 1.005–1.030)
Urobilinogen, Ur: 0.2 mg/dL (ref 0.2–1.0)
pH, UA: 5 (ref 5.0–7.5)

## 2019-11-07 LAB — MICROSCOPIC EXAMINATION: RBC, Urine: NONE SEEN /hpf (ref 0–2)

## 2019-11-07 LAB — BLADDER SCAN AMB NON-IMAGING

## 2019-11-07 NOTE — Progress Notes (Signed)
11/07/19 1:35 PM   Gary Morgan Apr 29, 1932 [id number]  CC: BPH and incomplete bladder emptying  HPI: I saw Gary Morgan in urology clinic today for evaluation of BPH and incomplete bladder emptying from Dr. Jeffie Pollock.  He is an 86 year old male with a long history of urinary symptoms, recurrent UTIs, and urinary retention who has been followed at [facility] urology by Dr. Jeffie Pollock.  The patient has a very long history of urinary symptoms and is on maximal medical management with finasteride and doxazosin.  He does have some leakage overnight, but really does not have any complaints about his urination during the day.  From chart review, it sounds like he has some poor bladder sensation and does not realize that he is not emptying completely.  This is led to multiple episodes of retention requiring ER visits for Foley catheter placement, as well as recurrent UTIs and even sepsis.  He was found to have a 150 g prostate based on a recent CT at [facility] per Dr. Jeffie Pollock.  He also had a cystoscopy recently with Dr. Jeffie Pollock that showed a 6 cm prostatic urethra but no suspicious bladder lesions.  He also recently underwent urodynamics with Dr. Jeffie Pollock which showed bladder outlet obstruction.  Urodynamics were notable for volume voided of 108 mL with max flow of 4 mL/s, detrusor pressure at peak flow was elevated at 53 cm of water, and PVR was significantly elevated at 479 mL.  Max capacity was 590 mL, first sensation occurred at 442 mL, and he had strong desire at 446 mL with an unstable contraction.  He reportedly was instructed to perform CIC daily, however he reports he has not been doing this over the last few weeks as he feels he has been urinating fine.  IPSS score today is 14, with quality of life mostly satisfied.  PVR is normal at 105 mL.  Baseline renal function is abnormal with a baseline creatinine of 2.4, EGFR of 25.  On his CT from [place] in February 2021 the prostate is significantly enlarged measuring 150 g  and there is moderate bilateral hydroureteronephrosis down to a distended bladder.   PMH: Past Medical History:  Diagnosis Date  . 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 Dois Davenport (urol).  Dr. Jeffie Pollock suspects this was due to stretch injury and has resolved as of 09/24/16.  . Arthritis of right wrist    s/p fracture of middle third of scaphoid bone sustained cleaning up from [facility] injection by Dr. Napoleon Form in [place] (ortho) made the pain go away.  Marland Kitchen BPH with obstruction/lower urinary tract symptoms    hx of urinary retention (s/p foley cath when in hosp for urosepsis 04/2016).  +Areflexic bladder.  TURP will not help since he has acontractile bladder.  Options are CIC, indwelling urethral foley, or suprapubic catheter, HOLEP or simple prostatectomy.  Has urethral foley as of 07/2016.  Marland Kitchen Chronic low back pain   . Chronic renal insufficiency, stage 4 (severe) (HCC)    CrCl 30s (saw nephrologist in [place]). +microalbuminuria 04/2014 at PCP in [place].  Stage IV 2018: GFR upper 20s.  Marland Kitchen COPD (chronic obstructive pulmonary disease) ([place])    on CXR 09/27/2015  . Diabetes mellitus ([place])    2015 HbA1c 6.2-6.3 %.  HbA1c 06/2016 5.4%.  A1c 6.5% 05/2017 (this was his first A1c of 6.5% or more)--nutritionist referral recommended.  . Diverticulosis 04/2016   Noted on CT 04/2016  . Elevated PSA 06/2016   Dr. Redmond Pulling ([facility] urologist)-awaiting records (  pt's wife reported pt's psa was 7).  Dr. Jeffie Pollock feels like this may have been a result of relatively recent episode of urinary retention, recheck PSA 12/29/16 was lower: 4.64, 24% f/t ratio, plan recheck 1 yr (Dr. Jeffie Pollock).   . History of anemia due to chronic kidney disease   . History of pyelonephritis 04/2016  . History of sepsis 04/2016   e coli  . Hypertension   . Kidney stones    one episode  . Recurrent UTI 2017  . 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 (04/2016 CT)  .  Urge incontinence     Surgical History: Past Surgical History:  Procedure Laterality Date  . TONSILLECTOMY  1940  . VENTRAL HERNIA REPAIR  2010   with strangulation of SB (approx 1 foot of SB had to be excised).  Had 3 total surgeries for this due to complications    Family History: Family History  Problem Relation Age of Onset  . Breast cancer Mother   . Diabetes Mother   . Bladder Cancer Neg Hx   . Kidney cancer Neg Hx   . Prostate cancer Neg Hx     Social History:  reports that he quit smoking about 41 years ago. His smoking use included cigarettes. He has a 1.25 pack-year smoking history. He has never used smokeless tobacco. He reports that he does not drink alcohol or use drugs.  Physical Exam: BP (!) 170/80   Pulse 60   Wt 181 lb (82.1 kg)   BMI 28.78 kg/m    Constitutional: Elderly, in wheelchair, conversational Cardiovascular: No clubbing, cyanosis, or edema. Respiratory: Normal respiratory effort, no increased work of breathing. GI: Abdomen is soft, nontender, nondistended, no abdominal masses  Laboratory Data: Reviewed, see HPI  Assessment & Plan:   In summary, he is an 86 year old male with 150 g prostate, recurrent UTIs, history of retention, urodynamic confirmed bladder outlet obstruction and incomplete emptying with significantly elevated PVRs who presents today to discuss follow-up as a management option for his BPH and incomplete bladder emptying.  He is currently not compliant with CIC as recommended by Dr. Jeffie Pollock.    I had a long conversation with the patient and his daughter today regarding treatment strategies.  I strongly recommended he consider an outlet procedure with his history of retention, bilateral hydronephrosis and CKD, recurrent UTIs and sepsis, and noncompliance with CIC with known incomplete bladder emptying based on recent urodynamics.  We also discussed the ability to come off of medications and no longer have to CIC after undergoing surgery.   He is very hesitant to undergo any procedure because he is worried about prostate cancer and his age with anesthesia.  I did my best to reassure these concerns.  We would send the tissue off to look for any prostate cancer, but his PSA trend has been reassuring.  If he did have an aggressive type of prostate cancer found, he could still get radiation for treatment if he desired.  He remains hesitant as he feels like he is voiding well without any problems currently.  We discussed the risks and benefits of HoLEP at length.  The procedure requires general anesthesia and takes 2 to 3 hours, and a holmium laser is used to enucleate the prostate and push this tissue into the bladder.  A morcellator is then used to remove this tissue, which is sent for pathology.  The vast majority of patients are able to discharge the  same day with a catheter in place for 2 to 3 days, and will follow-up in clinic for a voiding trial.  Approximately 5% of patients will be admitted overnight to monitor the urine, or if they have multiple co-morbidities.  We specifically discussed the risks of bleeding, infection, retrograde ejaculation, temporary urgency and urge incontinence, very low risk of long-term incontinence, pathologic evaluation of prostate tissue and possible detection of prostate cancer or other malignancy, and possible need for additional procedures.  Extensive HoLEP education provided, patient would like to think it over before making a decision.  Okay to schedule HOLEP if he opts for treatment.  RTC 4 weeks for virtual visit to re-discuss HoLEP.  Nickolas Madrid, MD 11/07/2019  [facility] [address] [place],  [address] [phone]

## 2019-11-07 NOTE — Patient Instructions (Signed)

## 2019-11-11 ENCOUNTER — Other Ambulatory Visit: Payer: Self-pay | Admitting: Urology

## 2019-11-11 DIAGNOSIS — N401 Enlarged prostate with lower urinary tract symptoms: Secondary | ICD-10-CM

## 2019-11-11 DIAGNOSIS — N138 Other obstructive and reflux uropathy: Secondary | ICD-10-CM

## 2019-11-14 ENCOUNTER — Encounter
Admission: RE | Admit: 2019-11-14 | Discharge: 2019-11-14 | Disposition: A | Discharge status: Home / Self Care | Payer: Medicare Other | Source: Ambulatory Visit | Attending: Urology | Admitting: Urology

## 2019-11-14 ENCOUNTER — Other Ambulatory Visit: Payer: Self-pay | Admitting: Urology

## 2019-11-14 ENCOUNTER — Other Ambulatory Visit: Payer: Self-pay

## 2019-11-14 NOTE — Patient Instructions (Signed)
COVID TESTING Date: Nov 15, 2019 Testing site:  [facility] [address] Thru Hours:  0:86 am - 1:00 pm Once you are tested, you are asked to stay quarantined (avoiding public places) until after your surgery.   Your procedure is scheduled on: Nov 18, 2019 Friday  Report to Day Surgery on the 2nd floor of the [facility]. To find out your arrival time, please call [phone] between 1PM - 3PM on: Thursday Nov 17, 2019  REMEMBER: Instructions that are not followed completely may result in serious medical risk, up to and including death; or upon the discretion of your surgeon and anesthesiologist your surgery may need to be rescheduled.  Do not eat food after midnight the night before surgery.  No gum chewing, lozengers or hard candies.  You may however, drink CLEAR liquids up to 2 hours before you are scheduled to arrive for your surgery. Do not drink anything within 2 hours of your scheduled arrival time.  Clear liquids include: - water   Do NOT drink anything that is not on this list.  Type 1 and Type 2 diabetics should only drink water.  TAKE THESE MEDICATIONS THE MORNING OF SURGERY WITH A SIP OF WATER: NONE  SHOWER MORNING OF SURGERY   Follow recommendations from Cardiologist, Pulmonologist or PCP regarding stopping Aspirin, Coumadin, Plavix, Eliquis, Pradaxa, or Pletal.  Stop Anti-inflammatories (NSAIDS) such as Advil, Aleve, Ibuprofen, Motrin, Naproxen, Naprosyn and Aspirin based products such as Excedrin, Goodys Powder, BC Powder. (May take Tylenol or Acetaminophen if needed.)  Stop ANY OVER THE COUNTER supplements until after surgery. (May continue Vitamin D, Vitamin B, and multivitamin.)  No Alcohol for 24 hours before or after surgery.  No Smoking including e-cigarettes for 24 hours prior to surgery.  No chewable tobacco products for at least 6 hours prior to surgery.  No nicotine patches on the day of surgery.  Do not use any  "recreational" drugs for at least a week prior to your surgery.  Please be advised that the combination of cocaine and anesthesia may have negative outcomes, up to and including death. If you test positive for cocaine, your surgery will be cancelled.  On the morning of surgery brush your teeth with toothpaste and water, you may rinse your mouth with mouthwash if you wish. Do not swallow any toothpaste or mouthwash.  Do not wear jewelry, make-up, hairpins, clips or nail polish.  Do not wear lotions, powders, or perfumes.   Do not shave 48 hours prior to surgery.   Contact lenses, hearing aids and dentures may not be worn into surgery.  Do not bring valuables to the hospital. [facility] is not responsible for any missing/lost belongings or valuables.   Notify your doctor if there is any change in your medical condition (cold, fever, infection).  Wear comfortable clothing (specific to your surgery type) to the hospital.  Plan for stool softeners for home use; pain medications have a tendency to cause constipation. You can also help prevent constipation by eating foods high in fiber such as fruits and vegetables and drinking plenty of fluids as your diet allows.  After surgery, you can help prevent lung complications by doing breathing exercises.  Take deep breaths and cough every 1-2 hours. Your doctor may order a device called an Incentive Spirometer to help you take deep breaths. When coughing or sneezing, hold a pillow firmly against your incision with both hands. This is called "splinting." Doing this helps protect your  incision. It also decreases belly discomfort.  If you are being discharged the day of surgery, you will not be allowed to drive home. You will need a responsible adult (18 years or older) to drive you home and stay with you that night.   If you are taking public transportation, you will need to have a responsible adult (18 years or older) with you. Please confirm  with your physician that it is acceptable to use public transportation.   Please call the [facility] Dept. at [phone] if you have any questions about these instructions.  Visitation Policy:  Patients undergoing a surgery or procedure may have one family member or support person with them as long as that person is not COVID-19 positive or experiencing its symptoms.  That person may remain in the waiting area during the procedure.  Children under 62 years of age may have both parents or legal guardians with them during their hospital stay.   Inpatient Visitation Update:  Two designated support people may visit a patient during visiting hours 7 am to 8 pm. It must be the same two designated people for the duration of the patient stay. The visitors may come and go during the day, and there is no switching out to have different visitors. A mask must be worn at all times, including in the patient room.

## 2019-11-15 ENCOUNTER — Encounter: Payer: Self-pay | Admitting: Family Medicine

## 2019-11-15 ENCOUNTER — Telehealth: Payer: Self-pay

## 2019-11-15 ENCOUNTER — Encounter
Admission: RE | Admit: 2019-11-15 | Discharge: 2019-11-15 | Disposition: A | Discharge status: Home / Self Care | Payer: Medicare Other | Source: Ambulatory Visit | Attending: Urology | Admitting: Urology

## 2019-11-15 DIAGNOSIS — I10 Essential (primary) hypertension: Secondary | ICD-10-CM | POA: Diagnosis not present

## 2019-11-15 DIAGNOSIS — Z20822 Contact with and (suspected) exposure to covid-19: Secondary | ICD-10-CM | POA: Insufficient documentation

## 2019-11-15 DIAGNOSIS — Z01818 Encounter for other preprocedural examination: Secondary | ICD-10-CM | POA: Insufficient documentation

## 2019-11-15 NOTE — Telephone Encounter (Signed)
Signed and put in box to go up front. Signed:  Crissie Sickles, MD           11/15/2019

## 2019-11-15 NOTE — Telephone Encounter (Signed)
Received call from [facility] at [facility] regarding clearance for upcoming surgery on Friday, 5/21. She faxed over paperwork to be reviewed and sent back to [phone]. Placed on PCP desk to review and sign, if appropriate.

## 2019-11-15 NOTE — Pre-Procedure Instructions (Signed)
Pre-Admit Testing Provider Notification Note  Provider Notified: Dr. Anitra Lauth (PCP)  Notification Mode:  1) Call to office 2) Fax  Reason: Request for Medical Clearance r/t EKG.  Response: 1) Spoke with Vevelyn Royals, Dr. Idelle Leech nurse. Made her aware that clearance request was being faxed. Obtained correct fax number.  2) Fax confirmation received. Placed on Chart   Additional Information: Notified Leah with Dr. Doristine Counter office.  Signed: Beulah Gandy, RN

## 2019-11-16 ENCOUNTER — Other Ambulatory Visit (HOSPITAL_COMMUNITY): Payer: Medicare Other

## 2019-11-16 LAB — SARS CORONAVIRUS 2 (TAT 6-24 HRS): SARS Coronavirus 2: NEGATIVE

## 2019-11-16 NOTE — Telephone Encounter (Signed)
Faxed back to their office.

## 2019-11-17 NOTE — Pre-Procedure Instructions (Signed)
MEDICAL CLEARANCE ON CHART 

## 2019-11-18 ENCOUNTER — Ambulatory Visit: Payer: Medicare Other | Admitting: Certified Registered"

## 2019-11-18 ENCOUNTER — Ambulatory Visit
Admission: RE | Admit: 2019-11-18 | Discharge: 2019-11-18 | Disposition: A | Discharge status: Home / Self Care | Payer: Medicare Other | Source: Ambulatory Visit | Attending: Urology | Admitting: Urology

## 2019-11-18 ENCOUNTER — Encounter
Admission: RE | Disposition: A | Discharge status: Home / Self Care | Payer: Self-pay | Source: Ambulatory Visit | Attending: Urology

## 2019-11-18 ENCOUNTER — Other Ambulatory Visit: Payer: Self-pay

## 2019-11-18 ENCOUNTER — Encounter: Payer: Self-pay | Admitting: Urology

## 2019-11-18 DIAGNOSIS — N401 Enlarged prostate with lower urinary tract symptoms: Secondary | ICD-10-CM | POA: Insufficient documentation

## 2019-11-18 DIAGNOSIS — N184 Chronic kidney disease, stage 4 (severe): Secondary | ICD-10-CM | POA: Diagnosis not present

## 2019-11-18 DIAGNOSIS — J449 Chronic obstructive pulmonary disease, unspecified: Secondary | ICD-10-CM | POA: Diagnosis not present

## 2019-11-18 DIAGNOSIS — E1122 Type 2 diabetes mellitus with diabetic chronic kidney disease: Secondary | ICD-10-CM | POA: Diagnosis not present

## 2019-11-18 DIAGNOSIS — R339 Retention of urine, unspecified: Secondary | ICD-10-CM | POA: Diagnosis not present

## 2019-11-18 DIAGNOSIS — D631 Anemia in chronic kidney disease: Secondary | ICD-10-CM | POA: Diagnosis not present

## 2019-11-18 DIAGNOSIS — N138 Other obstructive and reflux uropathy: Secondary | ICD-10-CM

## 2019-11-18 DIAGNOSIS — Z87891 Personal history of nicotine dependence: Secondary | ICD-10-CM | POA: Diagnosis not present

## 2019-11-18 DIAGNOSIS — R3914 Feeling of incomplete bladder emptying: Secondary | ICD-10-CM

## 2019-11-18 DIAGNOSIS — M1990 Unspecified osteoarthritis, unspecified site: Secondary | ICD-10-CM | POA: Diagnosis not present

## 2019-11-18 DIAGNOSIS — I129 Hypertensive chronic kidney disease with stage 1 through stage 4 chronic kidney disease, or unspecified chronic kidney disease: Secondary | ICD-10-CM | POA: Diagnosis not present

## 2019-11-18 HISTORY — PX: HOLEP-LASER ENUCLEATION OF THE PROSTATE WITH MORCELLATION: SHX6641

## 2019-11-18 LAB — GLUCOSE, CAPILLARY
Glucose-Capillary: 105 mg/dL — ABNORMAL HIGH (ref 70–99)
Glucose-Capillary: 102 mg/dL — ABNORMAL HIGH (ref 70–99)

## 2019-11-18 SURGERY — ENUCLEATION, PROSTATE, USING LASER, WITH MORCELLATION
Anesthesia: General

## 2019-11-18 MED ORDER — OXYCODONE HCL 5 MG/5ML PO SOLN: 5.0000 mg | Freq: Once | ORAL | Status: AC | PRN

## 2019-11-18 MED ORDER — FAMOTIDINE 20 MG PO TABS
ORAL_TABLET | ORAL | Status: AC
  Administered 2019-11-18: 20 mg via ORAL
  Filled 2019-11-18: qty 1

## 2019-11-18 MED ORDER — SODIUM CHLORIDE 0.9 % IV SOLN
1.0000 g | INTRAVENOUS | Status: AC
  Administered 2019-11-18: 1 g via INTRAVENOUS
  Filled 2019-11-18: qty 1

## 2019-11-18 MED ORDER — CIPROFLOXACIN IN D5W 400 MG/200ML IV SOLN
400.0000 mg | INTRAVENOUS | Status: AC
  Administered 2019-11-18: 400 mg via INTRAVENOUS

## 2019-11-18 MED ORDER — BELLADONNA ALKALOIDS-OPIUM 16.2-60 MG RE SUPP
RECTAL | Status: AC
  Filled 2019-11-18: qty 1

## 2019-11-18 MED ORDER — SUGAMMADEX SODIUM 500 MG/5ML IV SOLN
INTRAVENOUS | Status: DC | PRN
  Administered 2019-11-18: 400 mg via INTRAVENOUS

## 2019-11-18 MED ORDER — EPHEDRINE 5 MG/ML INJ
INTRAVENOUS | Status: AC
  Filled 2019-11-18: qty 10

## 2019-11-18 MED ORDER — ACETAMINOPHEN 10 MG/ML IV SOLN
INTRAVENOUS | Status: AC
  Filled 2019-11-18: qty 100

## 2019-11-18 MED ORDER — ROCURONIUM BROMIDE 100 MG/10ML IV SOLN
INTRAVENOUS | Status: DC | PRN
  Administered 2019-11-18: 50 mg via INTRAVENOUS

## 2019-11-18 MED ORDER — DEXAMETHASONE SODIUM PHOSPHATE 10 MG/ML IJ SOLN
INTRAMUSCULAR | Status: DC | PRN
  Administered 2019-11-18: 10 mg via INTRAVENOUS

## 2019-11-18 MED ORDER — ONDANSETRON HCL 4 MG/2ML IJ SOLN
INTRAMUSCULAR | Status: AC
  Filled 2019-11-18: qty 4

## 2019-11-18 MED ORDER — ROCURONIUM BROMIDE 10 MG/ML (PF) SYRINGE
PREFILLED_SYRINGE | INTRAVENOUS | Status: AC
  Filled 2019-11-18: qty 10

## 2019-11-18 MED ORDER — FENTANYL CITRATE (PF) 100 MCG/2ML IJ SOLN: 25.0000 ug | INTRAMUSCULAR | Status: DC | PRN

## 2019-11-18 MED ORDER — SUCCINYLCHOLINE CHLORIDE 20 MG/ML IJ SOLN
INTRAMUSCULAR | Status: DC | PRN
  Administered 2019-11-18: 150 mg via INTRAVENOUS

## 2019-11-18 MED ORDER — ONDANSETRON HCL 4 MG/2ML IJ SOLN
INTRAMUSCULAR | Status: DC | PRN
  Administered 2019-11-18: 4 mg via INTRAVENOUS

## 2019-11-18 MED ORDER — DEXAMETHASONE SODIUM PHOSPHATE 10 MG/ML IJ SOLN
INTRAMUSCULAR | Status: AC
  Filled 2019-11-18: qty 1

## 2019-11-18 MED ORDER — PROPOFOL 10 MG/ML IV BOLUS
INTRAVENOUS | Status: DC | PRN
  Administered 2019-11-18: 150 mg via INTRAVENOUS

## 2019-11-18 MED ORDER — PROPOFOL 500 MG/50ML IV EMUL
INTRAVENOUS | Status: AC
  Filled 2019-11-18: qty 50

## 2019-11-18 MED ORDER — SUCCINYLCHOLINE CHLORIDE 200 MG/10ML IV SOSY
PREFILLED_SYRINGE | INTRAVENOUS | Status: AC
  Filled 2019-11-18: qty 10

## 2019-11-18 MED ORDER — FENTANYL CITRATE (PF) 100 MCG/2ML IJ SOLN
INTRAMUSCULAR | Status: DC | PRN
  Administered 2019-11-18: 50 ug via INTRAVENOUS

## 2019-11-18 MED ORDER — ALBUTEROL SULFATE HFA 108 (90 BASE) MCG/ACT IN AERS
INHALATION_SPRAY | RESPIRATORY_TRACT | Status: AC
  Filled 2019-11-18: qty 6.7

## 2019-11-18 MED ORDER — PROPOFOL 10 MG/ML IV BOLUS
INTRAVENOUS | Status: AC
  Filled 2019-11-18: qty 20

## 2019-11-18 MED ORDER — PHENYLEPHRINE HCL (PRESSORS) 10 MG/ML IV SOLN
INTRAVENOUS | Status: DC | PRN
  Administered 2019-11-18 (×2): 100 ug via INTRAVENOUS

## 2019-11-18 MED ORDER — PHENYLEPHRINE HCL (PRESSORS) 10 MG/ML IV SOLN
INTRAVENOUS | Status: AC
  Filled 2019-11-18: qty 1

## 2019-11-18 MED ORDER — FAMOTIDINE 20 MG PO TABS: 20.0000 mg | ORAL_TABLET | Freq: Once | ORAL | Status: AC

## 2019-11-18 MED ORDER — OXYCODONE HCL 5 MG PO TABS
ORAL_TABLET | ORAL | Status: AC
  Filled 2019-11-18: qty 1

## 2019-11-18 MED ORDER — ACETAMINOPHEN 10 MG/ML IV SOLN
INTRAVENOUS | Status: DC | PRN
  Administered 2019-11-18: 1000 mg via INTRAVENOUS

## 2019-11-18 MED ORDER — SODIUM CHLORIDE 0.9 % IV SOLN: INTRAVENOUS | Status: DC

## 2019-11-18 MED ORDER — CIPROFLOXACIN IN D5W 400 MG/200ML IV SOLN
INTRAVENOUS | Status: AC
  Filled 2019-11-18: qty 200

## 2019-11-18 MED ORDER — LIDOCAINE HCL (PF) 2 % IJ SOLN
INTRAMUSCULAR | Status: AC
  Filled 2019-11-18: qty 20

## 2019-11-18 MED ORDER — CIPROFLOXACIN HCL 500 MG PO TABS
500.0000 mg | ORAL_TABLET | Freq: Every day | ORAL | 0 refills | Status: AC
Start: 2019-11-18 — End: 2019-11-22

## 2019-11-18 MED ORDER — GLYCOPYRROLATE 0.2 MG/ML IJ SOLN
INTRAMUSCULAR | Status: AC
  Filled 2019-11-18: qty 1

## 2019-11-18 MED ORDER — GLYCOPYRROLATE 0.2 MG/ML IJ SOLN
INTRAMUSCULAR | Status: DC | PRN
  Administered 2019-11-18: .2 mg via INTRAVENOUS

## 2019-11-18 MED ORDER — FENTANYL CITRATE (PF) 100 MCG/2ML IJ SOLN
INTRAMUSCULAR | Status: AC
  Filled 2019-11-18: qty 2

## 2019-11-18 MED ORDER — MIDAZOLAM HCL 2 MG/2ML IJ SOLN
INTRAMUSCULAR | Status: AC
  Filled 2019-11-18: qty 2

## 2019-11-18 MED ORDER — SODIUM CHLORIDE 0.9 % IV SOLN
INTRAVENOUS | Status: DC | PRN
  Administered 2019-11-18: 25 ug/min via INTRAVENOUS

## 2019-11-18 MED ORDER — HYDROCODONE-ACETAMINOPHEN 5-325 MG PO TABS: 1.0000 | ORAL_TABLET | ORAL | 0 refills | Status: AC | PRN

## 2019-11-18 MED ORDER — OXYCODONE HCL 5 MG PO TABS
5.0000 mg | ORAL_TABLET | Freq: Once | ORAL | Status: AC | PRN
  Administered 2019-11-18: 5 mg via ORAL

## 2019-11-18 MED ORDER — EPHEDRINE SULFATE 50 MG/ML IJ SOLN
INTRAMUSCULAR | Status: DC | PRN
  Administered 2019-11-18 (×2): 5 mg via INTRAVENOUS

## 2019-11-18 MED ORDER — BELLADONNA ALKALOIDS-OPIUM 16.2-60 MG RE SUPP
RECTAL | Status: DC | PRN
  Administered 2019-11-18: 1 via RECTAL

## 2019-11-18 MED ORDER — LIDOCAINE HCL (CARDIAC) PF 100 MG/5ML IV SOSY
PREFILLED_SYRINGE | INTRAVENOUS | Status: DC | PRN
  Administered 2019-11-18: 100 mg via INTRAVENOUS

## 2019-11-18 SURGICAL SUPPLY — 34 items
ADAPTER IRRIG TUBE 2 SPIKE SOL (ADAPTER) ×6 IMPLANT
BAG URO DRAIN 4000ML (MISCELLANEOUS) ×2 IMPLANT
CATH FOLEY 3WAY 30CC 24FR (CATHETERS) ×1
CATH URETL 5X70 OPEN END (CATHETERS) ×2 IMPLANT
CATH URTH STD 24FR FL 3W 2 (CATHETERS) ×1 IMPLANT
CONTAINER COLLECT MORCELLATR (MISCELLANEOUS) ×1 IMPLANT
DRAPE UTILITY 15X26 TOWEL STRL (DRAPES) ×2 IMPLANT
ELECT BIVAP BIPO 22/24 DONUT (ELECTROSURGICAL)
ELECTRD BIVAP BIPO 22/24 DONUT (ELECTROSURGICAL) IMPLANT
FILTER OVERFLOW MORCELLATOR (FILTER) ×1 IMPLANT
GLOVE BIOGEL PI IND STRL 7.5 (GLOVE) ×2 IMPLANT
GLOVE BIOGEL PI INDICATOR 7.5 (GLOVE) ×2
GOWN STRL REUS W/ TWL LRG LVL3 (GOWN DISPOSABLE) ×1 IMPLANT
GOWN STRL REUS W/ TWL XL LVL3 (GOWN DISPOSABLE) ×1 IMPLANT
GOWN STRL REUS W/TWL LRG LVL3 (GOWN DISPOSABLE) ×1
GOWN STRL REUS W/TWL XL LVL3 (GOWN DISPOSABLE) ×1
HOLDER FOLEY CATH W/STRAP (MISCELLANEOUS) ×2 IMPLANT
KIT TURNOVER CYSTO (KITS) ×2 IMPLANT
LASER FIBER 550M SMARTSCOPE (Laser) ×2 IMPLANT
MBRN O SEALING YLW 17 FOR INST (MISCELLANEOUS) ×2
MEMBRANE SLNG YLW 17 FOR INST (MISCELLANEOUS) ×1 IMPLANT
MORCELLATOR COLLECT CONTAINER (MISCELLANEOUS) ×2
MORCELLATOR OVERFLOW FILTER (FILTER) ×2
MORCELLATOR ROTATION 4.75 335 (MISCELLANEOUS) ×2 IMPLANT
PACK CYSTO AR (MISCELLANEOUS) ×2 IMPLANT
SET CYSTO W/LG BORE CLAMP LF (SET/KITS/TRAYS/PACK) ×2 IMPLANT
SET IRRIG Y TYPE TUR BLADDER L (SET/KITS/TRAYS/PACK) ×2 IMPLANT
SLEEVE PROTECTION STRL DISP (MISCELLANEOUS) ×4 IMPLANT
SOL .9 NS 3000ML IRR  AL (IV SOLUTION) ×11
SOL .9 NS 3000ML IRR UROMATIC (IV SOLUTION) ×11 IMPLANT
SURGILUBE 2OZ TUBE FLIPTOP (MISCELLANEOUS) IMPLANT
SYRINGE IRR TOOMEY STRL 70CC (SYRINGE) ×2 IMPLANT
TUBE PUMP MORCELLATOR PIRANHA (TUBING) ×2 IMPLANT
WATER STERILE IRR 1000ML POUR (IV SOLUTION) ×2 IMPLANT

## 2019-11-18 NOTE — Anesthesia Preprocedure Evaluation (Signed)
Anesthesia Evaluation  Patient identified by MRN, date of birth, ID band Patient awake    Reviewed: Allergy & Precautions, H&P , NPO status , Patient's Chart, lab work & pertinent test results  History of Anesthesia Complications Negative for: history of anesthetic complications  Airway Mallampati: III  TM Distance: >3 FB Neck ROM: limited    Dental  (+) Chipped, Poor Dentition, Missing, Edentulous Upper   Pulmonary pneumonia, COPD, former smoker,    Pulmonary exam normal        Cardiovascular Exercise Tolerance: Good hypertension, (-) angina(-) Past MI and (-) DOE Normal cardiovascular exam     Neuro/Psych  Neuromuscular disease negative psych ROS   GI/Hepatic negative GI ROS, Neg liver ROS,   Endo/Other  diabetes, Type 2  Renal/GU CRFRenal disease     Musculoskeletal  (+) Arthritis ,   Abdominal   Peds  Hematology negative hematology ROS (+)   Anesthesia Other Findings Past Medical History: 06/11/2016: Acontractile bladder     Comment:  Urodynamics done at Dr. Dois Davenport (urol).  Dr. Jeffie Pollock               suspects this was due to stretch injury and has resolved               as of 09/24/16. No date: Arthritis of right wrist     Comment:  s/p fracture of middle third of scaphoid bone sustained               cleaning up from [facility] injection               by Dr. Napoleon Form in [place] (ortho) made the pain go               away. No date: BPH with obstruction/lower urinary tract symptoms     Comment:  hx of urinary retention (s/p foley cath when in hosp for              urosepsis 04/2016).  +Areflexic bladder.  TURP will not               help since he has acontractile bladder.  Options are CIC,              indwelling urethral foley, or suprapubic catheter, HOLEP               or simple prostatectomy.  Has urethral foley as of               07/2016. No date: Chronic low back pain No date:  Chronic renal insufficiency, stage 4 (severe) (HCC)     Comment:  CrCl 30s (saw nephrologist in [place]).               +microalbuminuria 04/2014 at PCP in [place].  Stage               IV 2018: GFR upper 20s. No date: COPD (chronic obstructive pulmonary disease) ([place])     Comment:  on CXR 09/27/2015 No date: Diabetes mellitus ([place])     Comment:  2015 HbA1c 6.2-6.3 %.  HbA1c 06/2016 5.4%.  A1c 6.5%               05/2017 (this was his first A1c of 6.5% or               more)--nutritionist referral recommended. 04/2016: Diverticulosis     Comment:  Noted on CT 04/2016 06/2016: Elevated PSA     Comment:  Dr. Redmond Pulling ([facility] urologist)-awaiting records (pt's               wife reported pt's psa was 7).  Dr. Jeffie Pollock feels like this              may have been a result of relatively recent episode of               urinary retention, recheck PSA 12/29/16 was lower: 4.64,               24% f/t ratio, plan recheck 1 yr (Dr. Jeffie Pollock).  No date: History of anemia due to chronic kidney disease 04/2016: History of pyelonephritis 04/2016: History of sepsis     Comment:  e coli No date: Hypertension No date: Kidney stones     Comment:  one episode 2017: Recurrent UTI No date: Renal cysts, acquired, bilateral     Comment:  one dominant on R, Bosniak type II. (04/2016 CT) No date: Urge incontinence  Past Surgical History: No date: CATARACT EXTRACTION, BILATERAL 1940: TONSILLECTOMY 2010: VENTRAL HERNIA REPAIR     Comment:  with strangulation of SB (approx 1 foot of SB had to be               excised).  Had 3 total surgeries for this due to               complications  BMI    Body Mass Index: 29.21 kg/m      Reproductive/Obstetrics negative OB ROS                             Anesthesia Physical Anesthesia Plan  ASA: III  Anesthesia Plan: General ETT   Post-op Pain Management:    Induction: Intravenous  PONV Risk Score and Plan: Ondansetron, Dexamethasone,  Midazolam and Treatment may vary due to age or medical condition  Airway Management Planned: Oral ETT  Additional Equipment:   Intra-op Plan:   Post-operative Plan: Extubation in OR  Informed Consent: I have reviewed the patients History and Physical, chart, labs and discussed the procedure including the risks, benefits and alternatives for the proposed anesthesia with the patient or authorized representative who has indicated his/her understanding and acceptance.     Dental Advisory Given  Plan Discussed with: Anesthesiologist, CRNA and Surgeon  Anesthesia Plan Comments: (Patient consented for risks of anesthesia including but not limited to:  - adverse reactions to medications - damage to eyes, teeth, lips or other oral mucosa - nerve damage due to positioning  - sore throat or hoarseness - Damage to heart, brain, nerves, lungs or loss of life  Patient voiced understanding.)        Anesthesia Quick Evaluation

## 2019-11-18 NOTE — Op Note (Addendum)
Date of procedure: 11/18/19  Preoperative diagnosis:  1. BPH with obstruction and incomplete emptying   Postoperative diagnosis:  1. Same  Procedure: 1. HoLEP (Holmium Laser Enucleation of the Prostate)  Surgeon: Nickolas Madrid, MD  Anesthesia: General  Complications: None  Intraoperative findings:  1.  Large prostate with high bladder neck and obstructing lateral lobes 2.  No bladder lesions, mild to moderate bladder trabeculations 3.  Veru and ureteral orifices intact bilaterally at conclusion of case  EBL: 10 cc  Specimens: Prostate chips  Enucleation time: 54 minutes  Morcellation time: 14 minutes  Path weight: 88 g   Drains: 24 French three-way, 60 cc in balloon  Indication: Gary Morgan is a 86 y.o. patient with BPH and enlarged prostate approximately 150 g with a long history of recurrent urinary tract infection/urosepsis and urinary retention with upstream hydronephrosis.  Urodynamics in [place] demonstrated bladder outlet obstruction with a functional bladder.  PVRs have been elevated around 553mL in the past. After reviewing the management options for treatment, they elected to proceed with the above surgical procedure(s). We have discussed the potential benefits and risks of the procedure, side effects of the proposed treatment, the likelihood of the patient achieving the goals of the procedure, and any potential problems that might occur during the procedure or recuperation.  We specifically discussed the risks of bleeding, infection, hematuria and clot retention, need for additional procedures, possible overnight hospital stay, temporary urgency and incontinence, rare long-term incontinence, and retrograde ejaculation.  Informed consent has been obtained.   Description of procedure:  The patient was taken to the operating room and general anesthesia was induced.  The patient was placed in the dorsal lithotomy position, prepped and draped in the usual sterile  fashion, and preoperative antibiotics(ampicillin and Cipro) were administered.  SCDs were placed for DVT prophylaxis.  A preoperative time-out was performed.   The 34 French continuous flow resectoscope was inserted into the urethra using the visual obturator  The prostate was large with obstructing lateral lobes. The bladder was thoroughly inspected and notable for moderate bladder trabeculations but no suspicious lesions.  The ureteral orifices were located in orthotopic position.  The laser was set to 2 J and 50 Hz and was used to make an incision at the 6 o'clock position to the level of the capsule from the bladder neck to the verumontanum.  The lateral lobes were then incised circumferentially until they were disconnected from the surrounding tissue.  The capsule was examined and laser was used for meticulous hemostasis.    The 24 French resectoscope was then switched out for the 11 French nephroscope and the lobes were morcellated and the tissue sent to pathology.  A 24 French three-way catheter was inserted easily, and CBI was initiated.  60 cc were placed in the balloon.  Urine was faint pink.  The catheter irrigated easily with a Toomey syringe.  A belladonna suppository was placed.  The patient tolerated the procedure well without any immediate complications and was extubated and transferred to the recovery room in stable condition.  Urine was clear on fast CBI.  Disposition: Stable to PACU  Plan: Wean CBI in PACU, anticipate discharge home today with void trial in clinic in 2-3 days  Nickolas Madrid, MD 11/18/2019

## 2019-11-18 NOTE — Discharge Instructions (Signed)
Indwelling Urinary Catheter Care, Adult An indwelling urinary catheter is a thin tube that is put into your bladder. The tube helps to drain pee (urine) out of your body. The tube goes in through your urethra. Your urethra is where pee comes out of your body. Your pee will come out through the catheter, then it will go into a bag (drainage bag). Take good care of your catheter so it will work well. How to wear your catheter and bag Supplies needed  Sticky tape (adhesive tape) or a leg strap.  Alcohol wipe or soap and water (if you use tape).  A clean towel (if you use tape).  Large overnight bag.  Smaller bag (leg bag). Wearing your catheter Attach your catheter to your leg with tape or a leg strap.  Make sure the catheter is not pulled tight.  If a leg strap gets wet, take it off and put on a dry strap.  If you use tape to hold the bag on your leg: 1. Use an alcohol wipe or soap and water to wash your skin where the tape made it sticky before. 2. Use a clean towel to pat-dry that skin. 3. Use new tape to make the bag stay on your leg. Wearing your bags You should have been given a large overnight bag.  You may wear the overnight bag in the day or night.  Always have the overnight bag lower than your bladder.  Do not let the bag touch the floor.  Before you go to sleep, put a clean plastic bag in a wastebasket. Then hang the overnight bag inside the wastebasket. You should also have a smaller leg bag that fits under your clothes.  Always wear the leg bag below your knee.  Do not wear your leg bag at night. How to care for your skin and catheter Supplies needed  A clean washcloth.  Water and mild soap.  A clean towel. Caring for your skin and catheter      Clean the skin around your catheter every day: 1. Wash your hands with soap and water. 2. Wet a clean washcloth in warm water and mild soap. 3. Clean the skin around your urethra.  If you are  male:  Gently spread the folds of skin around your vagina (labia).  With the washcloth in your other hand, wipe the inner side of your labia on each side. Wipe from front to back.  If you are male:  Pull back any skin that covers the end of your penis (foreskin).  With the washcloth in your other hand, wipe your penis in small circles. Start wiping at the tip of your penis, then move away from the catheter.  Move the foreskin back in place, if needed. 4. With your free hand, hold the catheter close to where it goes into your body.  Keep holding the catheter during cleaning so it does not get pulled out. 5. With the washcloth in your other hand, clean the catheter.  Only wipe downward on the catheter.  Do not wipe upward toward your body. Doing this may push germs into your urethra and cause infection. 6. Use a clean towel to pat-dry the catheter and the skin around it. Make sure to wipe off all soap. 7. Wash your hands with soap and water.  Shower every day. Do not take baths.  Do not use cream, ointment, or lotion on the area where the catheter goes into your body, unless your doctor tells you   to.  Do not use powders, sprays, or lotions on your genital area.  Check your skin around the catheter every day for signs of infection. Check for: ? Redness, swelling, or pain. ? Fluid or blood. ? Warmth. ? Pus or a bad smell. How to empty the bag Supplies needed  Rubbing alcohol.  Gauze pad or cotton ball.  Tape or a leg strap. Emptying the bag Pour the pee out of your bag when it is ?- full, or at least 2-3 times a day. Do this for your overnight bag and your leg bag. 1. Wash your hands with soap and water. 2. Separate (detach) the bag from your leg. 3. Hold the bag over the toilet or a clean pail. Keep the bag lower than your hips and bladder. This is so the pee (urine) does not go back into the tube. 4. Open the pour spout. It is at the bottom of the bag. 5. Empty the  pee into the toilet or pail. Do not let the pour spout touch any surface. 6. Put rubbing alcohol on a gauze pad or cotton ball. 7. Use the gauze pad or cotton ball to clean the pour spout. 8. Close the pour spout. 9. Attach the bag to your leg with tape or a leg strap. 10. Wash your hands with soap and water. Follow instructions for cleaning the drainage bag:  From the product maker.  As told by your doctor. How to change the bag Supplies needed  Alcohol wipes.  A clean bag.  Tape or a leg strap. Changing the bag Replace your bag when it starts to leak, smell bad, or look dirty. 1. Wash your hands with soap and water. 2. Separate the dirty bag from your leg. 3. Pinch the catheter with your fingers so that pee does not spill out. 4. Separate the catheter tube from the bag tube where these tubes connect (at the connection valve). Do not let the tubes touch any surface. 5. Clean the end of the catheter tube with an alcohol wipe. Use a different alcohol wipe to clean the end of the bag tube. 6. Connect the catheter tube to the tube of the clean bag. 7. Attach the clean bag to your leg with tape or a leg strap. Do not make the bag tight on your leg. 8. Wash your hands with soap and water. General rules   Never pull on your catheter. Never try to take it out. Doing that can hurt you.  Always wash your hands before and after you touch your catheter or bag. Use a mild, fragrance-free soap. If you do not have soap and water, use hand sanitizer.  Always make sure there are no twists or bends (kinks) in the catheter tube.  Always make sure there are no leaks in the catheter or bag.  Drink enough fluid to keep your pee pale yellow.  Do not take baths, swim, or use a hot tub.  If you are male, wipe from front to back after you poop (have a bowel movement). Contact a doctor if:  Your pee is cloudy.  Your pee smells worse than usual.  Your catheter gets clogged.  Your catheter  leaks.  Your bladder feels full. Get help right away if:  You have redness, swelling, or pain where the catheter goes into your body.  You have fluid, blood, pus, or a bad smell coming from the area where the catheter goes into your body.  Your skin feels warm where   the catheter goes into your body.  You have a fever.  You have pain in your: ? Belly (abdomen). ? Legs. ? Lower back. ? Bladder.  You see blood in the catheter.  Your pee is pink or red.  You feel sick to your stomach (nauseous).  You throw up (vomit).  You have chills.  Your pee is not draining into the bag.  Your catheter gets pulled out. Summary  An indwelling urinary catheter is a thin tube that is placed into the bladder to help drain pee (urine) out of the body.  The catheter is placed into the part of the body that drains pee from the bladder (urethra).  Taking good care of your catheter will keep it working properly and help prevent problems.  Always wash your hands before and after touching your catheter or bag.  Never pull on your catheter or try to take it out. This information is not intended to replace advice given to you by your health care provider. Make sure you discuss any questions you have with your health care provider. Document Revised: 10/08/2018 Document Reviewed: 01/30/2017 Elsevier Patient Education  2020 [employer]  

## 2019-11-18 NOTE — Anesthesia Procedure Notes (Signed)
Procedure Name: Intubation Performed by: Kelton Pillar, CRNA Pre-anesthesia Checklist: Patient identified, Emergency Drugs available, Suction available and Patient being monitored Patient Re-evaluated:Patient Re-evaluated prior to induction Oxygen Delivery Method: Circle system utilized Preoxygenation: Pre-oxygenation with 100% oxygen Induction Type: IV induction Ventilation: Oral airway inserted - appropriate to patient size Laryngoscope Size: McGraph and 3 Grade View: Grade I Tube type: Oral Tube size: 7.0 mm Number of attempts: 1 Airway Equipment and Method: Stylet Placement Confirmation: ETT inserted through vocal cords under direct vision,  positive ETCO2,  CO2 detector and breath sounds checked- equal and bilateral Secured at: 21 cm Tube secured with: Tape Dental Injury: Teeth and Oropharynx as per pre-operative assessment

## 2019-11-18 NOTE — Anesthesia Postprocedure Evaluation (Signed)
Anesthesia Post Note  Patient: Gary Morgan  Procedure(s) Performed: HOLEP-LASER ENUCLEATION OF THE PROSTATE WITH MORCELLATION (N/A )  Patient location during evaluation: PACU Anesthesia Type: General Level of consciousness: awake and alert Pain management: pain level controlled Vital Signs Assessment: post-procedure vital signs reviewed and stable Respiratory status: spontaneous breathing, nonlabored ventilation, respiratory function stable and patient connected to nasal cannula oxygen Cardiovascular status: blood pressure returned to baseline and stable Postop Assessment: no apparent nausea or vomiting Anesthetic complications: no     Last Vitals:  Vitals:   11/18/19 0950 11/18/19 1003  BP:  (!) 144/89  Pulse:  66  Resp:  16  Temp:  (!) 36.2 C  SpO2: 96% 95%    Last Pain:  Vitals:   11/18/19 1003  TempSrc:   PainSc: 0-No pain                 Precious Haws Piscitello

## 2019-11-18 NOTE — Transfer of Care (Signed)
Immediate Anesthesia Transfer of Care Note  Patient: Gary Morgan  Procedure(s) Performed: HOLEP-LASER ENUCLEATION OF THE PROSTATE WITH MORCELLATION (N/A )  Patient Location: PACU  Anesthesia Type:General  Level of Consciousness: awake, drowsy and patient cooperative  Airway & Oxygen Therapy: Patient Spontanous Breathing and Patient connected to face mask oxygen  Post-op Assessment: Report given to RN and Post -op Vital signs reviewed and stable  Post vital signs: Reviewed and stable  Last Vitals:  Vitals Value Taken Time  BP 140/81 11/18/19 0918  Temp    Pulse 71 11/18/19 0920  Resp 16 11/18/19 0920  SpO2 99 % 11/18/19 0920  Vitals shown include unvalidated device data.  Last Pain:  Vitals:   11/18/19 0617  TempSrc: Temporal  PainSc: 0-No pain         Complications: No apparent anesthesia complications

## 2019-11-18 NOTE — H&P (Addendum)
UROLOGY H&P UPDATE  Agree with prior H&P dated 11/07/2019.  86 year old male with BPH and 160 g prostate, history of multiple episodes of UTI/urosepsis and urinary retention, as well as CKD and most recent imaging showing bilateral hydroureteronephrosis.  Urodynamics in [place] showed a functional bladder with bladder outlet obstruction.  He has been noncompliant with CIC.  He is opted for HOLEP for an outlet procedure  Cardiac: RRR Lungs: CTA bilaterally  Laterality: n/a Procedure:   Urine: Urinalysis 5/10 6-10 WBCs, 0 RBCs, few bacteria, nitrite negative, no leukocytes Outside culture <10k mixed urogenital flora  We discussed the risks and benefits of HoLEP at length.  The procedure requires general anesthesia and takes 2 to 3 hours, and a holmium laser is used to enucleate the prostate and push this tissue into the bladder.  A morcellator is then used to remove this tissue, which is sent for pathology.  The vast majority of patients are able to discharge the same day with a catheter in place for 2 to 3 days, and will follow-up in clinic for a voiding trial.  Approximately 5% of patients will be admitted overnight to monitor the urine, or if they have multiple co-morbidities.  We specifically discussed the risks of bleeding, infection, retrograde ejaculation, temporary urgency and urge incontinence, low risk of long-term incontinence(however higher with his long history of obstruction), pathologic evaluation of prostate tissue and possible detection of prostate cancer or other malignancy, and possible need for additional procedures.   Billey Co, MD 11/18/2019

## 2019-11-19 ENCOUNTER — Encounter: Payer: Self-pay | Admitting: Family Medicine

## 2019-11-21 ENCOUNTER — Ambulatory Visit (INDEPENDENT_AMBULATORY_CARE_PROVIDER_SITE_OTHER): Payer: Medicare Other | Admitting: Physician Assistant

## 2019-11-21 ENCOUNTER — Ambulatory Visit: Payer: Medicare Other | Admitting: Physician Assistant

## 2019-11-21 ENCOUNTER — Other Ambulatory Visit: Payer: Self-pay

## 2019-11-21 ENCOUNTER — Encounter: Payer: Self-pay | Admitting: Physician Assistant

## 2019-11-21 VITALS — BP 189/86 | HR 62 | Ht 68.0 in | Wt 182.0 lb

## 2019-11-21 DIAGNOSIS — N138 Other obstructive and reflux uropathy: Secondary | ICD-10-CM

## 2019-11-21 DIAGNOSIS — N401 Enlarged prostate with lower urinary tract symptoms: Secondary | ICD-10-CM

## 2019-11-21 LAB — SURGICAL PATHOLOGY

## 2019-11-21 NOTE — Telephone Encounter (Signed)
Stick with tylenol, can take 1-2 of the 500 mg tabs every 6 hrs as needed. Avoid ibuprofen and aleve b/c I don't want him to have anything that could potentially cause him kidney damage unless we absolutely have to. -thx

## 2019-11-21 NOTE — Progress Notes (Signed)
Fill and Pull Catheter Removal  Patient is present today for a catheter removal.  Patient was cleaned and prepped in a sterile fashion 147ml of sterile water was instilled into the bladder when the patient felt the urge to urinate. 71ml of water was then drained from the balloon.  A 24FR foley cath was removed from the bladder no complications were noted .  Patient was then given some time to void on their own.  Patient can void 138ml on their own after some time.  Patient tolerated well.  Performed by: Bradly Bienenstock, CMA  Follow up/ Additional notes: Voiding trial passed. Normal postoperative course reviewed including dysuria, hematuria, and leakage, which are expected to resolve on their own with time. Counseled patient to wear absorbant pads and underwear as desired for security and to contact our office if he develops the inability to urinate, sensation of incomplete emptying, lower abdominal pain, or weak stream. Counseled patient to start kegel exercises 3x10 sets daily; written and verbal instructions provided today. Patient to follow-up at previously-scheduled 6 week follow-up appointment with Dr. Diamantina Providence as below. Debroah Loop, PA-C   Future Appointments  Date Time Provider [place]  12/14/2019 11:30 AM Billey Co, MD [facility] None

## 2019-11-21 NOTE — Patient Instructions (Signed)

## 2019-11-21 NOTE — Telephone Encounter (Signed)
Should patient be taking Ibuprofen and/or Tylenol? Please advise, thanks.

## 2019-11-22 ENCOUNTER — Telehealth: Payer: Self-pay

## 2019-11-22 NOTE — Telephone Encounter (Signed)
Called pt no answer. Left detailed message per DPR. Advised pt to call back for questions or concerns.  

## 2019-11-22 NOTE — Telephone Encounter (Signed)
-----   Message from Billey Co, MD sent at 11/22/2019  8:20 AM EDT ----- Doristine Devoid news, no cancer seen on HOLEP tissue, follow up as scheduled, thanks  Nickolas Madrid, MD 11/22/2019

## 2019-11-27 ENCOUNTER — Other Ambulatory Visit: Payer: Self-pay | Admitting: Family Medicine

## 2019-12-01 ENCOUNTER — Encounter: Payer: Self-pay | Admitting: Family Medicine

## 2019-12-02 NOTE — Telephone Encounter (Signed)
Needs to ask urologist about this.

## 2019-12-02 NOTE — Telephone Encounter (Signed)
Please advise 

## 2019-12-07 ENCOUNTER — Ambulatory Visit (INDEPENDENT_AMBULATORY_CARE_PROVIDER_SITE_OTHER): Payer: Medicare Other | Admitting: Urology

## 2019-12-07 ENCOUNTER — Ambulatory Visit (INDEPENDENT_AMBULATORY_CARE_PROVIDER_SITE_OTHER): Payer: Medicare Other | Admitting: Family Medicine

## 2019-12-07 ENCOUNTER — Encounter: Payer: Self-pay | Admitting: Family Medicine

## 2019-12-07 ENCOUNTER — Other Ambulatory Visit: Payer: Self-pay

## 2019-12-07 ENCOUNTER — Encounter: Payer: Self-pay | Admitting: Urology

## 2019-12-07 VITALS — BP 169/101 | HR 57 | Temp 98.0°F | Resp 16 | Ht 68.0 in | Wt 172.6 lb

## 2019-12-07 VITALS — BP 158/94 | HR 67 | Ht 68.0 in | Wt 170.0 lb

## 2019-12-07 DIAGNOSIS — N138 Other obstructive and reflux uropathy: Secondary | ICD-10-CM

## 2019-12-07 DIAGNOSIS — N401 Enlarged prostate with lower urinary tract symptoms: Secondary | ICD-10-CM

## 2019-12-07 DIAGNOSIS — N184 Chronic kidney disease, stage 4 (severe): Secondary | ICD-10-CM

## 2019-12-07 DIAGNOSIS — R5383 Other fatigue: Secondary | ICD-10-CM | POA: Diagnosis not present

## 2019-12-07 DIAGNOSIS — E119 Type 2 diabetes mellitus without complications: Secondary | ICD-10-CM | POA: Diagnosis not present

## 2019-12-07 DIAGNOSIS — N139 Obstructive and reflux uropathy, unspecified: Secondary | ICD-10-CM | POA: Diagnosis not present

## 2019-12-07 DIAGNOSIS — I10 Essential (primary) hypertension: Secondary | ICD-10-CM | POA: Diagnosis not present

## 2019-12-07 LAB — BLADDER SCAN AMB NON-IMAGING

## 2019-12-07 MED ORDER — HYDROCODONE-ACETAMINOPHEN 5-325 MG PO TABS: 1.0000 | ORAL_TABLET | Freq: Four times a day (QID) | ORAL | 0 refills | Status: DC | PRN

## 2019-12-07 MED ORDER — CIPROFLOXACIN HCL 500 MG PO TABS
500.0000 mg | ORAL_TABLET | Freq: Two times a day (BID) | ORAL | 0 refills | Status: DC
Start: 2019-12-07 — End: 2020-05-09

## 2019-12-07 MED ORDER — CIPROFLOXACIN HCL 500 MG PO TABS
500.0000 mg | ORAL_TABLET | Freq: Two times a day (BID) | ORAL | 0 refills | Status: DC
Start: 2019-12-07 — End: 2019-12-07

## 2019-12-07 NOTE — Patient Instructions (Signed)
1. Take pyridium(phenazopyridine) over the counter to help with burning with urination. This will turn your urine orange 2. Keep scheduled follow up

## 2019-12-07 NOTE — Addendum Note (Signed)
Addended by: Deveron Furlong D on: 12/07/2019 02:38 PM   Modules accepted: Orders

## 2019-12-07 NOTE — Progress Notes (Signed)
Office Note 12/07/2019  CC:  Chief Complaint  Patient presents with  . Follow-up    RCI, pt is fasting    HPI:  Gary Morgan is a 86 y.o. White male who is here accompanied by his wife for 6 mo f/u diet controlled DM 2, HTN, and CRI with GFR in the 20s (medical renal dz + obstructive uropathy). A/P as of last visit: "1) HTN: The current medical regimen is effective;  continue present plan and medications. Future BMET at his earliest convenience.  2) DM 2: diet controlled. Doing well with diet, exercise, purposeful wt loss. Hba1c future.  3) CRI IV: hydrates well and avoids NSAIDs. BMET future.  4) Polypharmacy: we discussed recent studies showing lack of good evidence to use aspirin for primary cv prevention.  Risk of daily ASA outweighs benefits for this pt-->decided to d/c ASA today."  INTERIM HX: Since I last saw him he began having bad trouble with BPH and urinary retention, eventually had to have a HOLEP 11/18/19.  Doing fairly well since surgery, just still tired.  He empties bladder well, still has a bit of dribbling after urinating but NO FEELING OF URINARY RETENTION. Eating fine, small meals.  Doesn't drink clear fluids optimally.  Water makes him feel bloated ---this is not new.  Burns when he urinates, hurts/pressure when he sits. Some blood in urine initially after surgery but none since.  No particles coming out.  Clear and yellow urine color.  NO nausea or stomach hurting.  He just now told his wife these sx's a couple days ago and he has f/u with urol arranged for later today.  He has a small amount of hydrocodone pills but has RARELY taken any.  Not currently on antibiotics. He ambulates a bit unsteady chronically, worse since the fatigue of surgery. Home bps range from 114-162 syst over 70-89 diast.    He does not check his sugars at home. Diet is fair.  No exercise but he walks to his shed and back daily.  ROS: no fevers, no CP, no SOB, no wheezing, no  cough, no dizziness, no HAs, no rashes, no melena/hematochezia.  No polyuria or polydipsia.  No myalgias or arthralgias.  No focal weakness, paresthesias, or tremors.  No acute vision or hearing abnormalities. No n/v/d or abd pain.  No palpitations.     Past Medical History:  Diagnosis Date  . 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  Gary Morgan suspects this was due to stretch injury and has resolved as of 09/24/16.  . Arthritis of right wrist    s/p fracture of middle third of scaphoid bone sustained cleaning up from [facility] injection by Gary Morgan in [place] (ortho) made the pain go away.  Marland Kitchen BPH with obstruction/lower urinary tract symptoms    hx of urinary retention (s/p foley cath when in hosp for urosepsis 04/2016).  +Areflexic bladder.  TURP will not help since he has acontractile bladder.  Options are CIC, indwelling urethral foley, or suprapubic catheter, HOLEP or simple prostatectomy.  HOLEP done 10/2019.  Marland Kitchen Chronic low back pain   . Chronic renal insufficiency, stage 4 (severe) (HCC)    CrCl 30s (saw nephrologist in [place]). +microalbuminuria 04/2014 at PCP in [place].  Stage IV 2018: GFR upper 20s.  Marland Kitchen COPD (chronic obstructive pulmonary disease) ([place])    on CXR 09/27/2015  . Diabetes mellitus ([place])    2015 HbA1c 6.2-6.3 %.  HbA1c 06/2016 5.4%.  A1c 6.5%  05/2017 (this was his first A1c of 6.5% or more)--nutritionist referral recommended.  . Diverticulosis 04/2016   Noted on CT 04/2016  . Elevated PSA 06/2016   Gary Morgan ([facility] urologist)-awaiting records (pt's wife reported pt's psa was 7).  Gary Morgan feels like this may have been a result of relatively recent episode of urinary retention, recheck PSA 12/29/16 was lower: 4.64, 24% f/t ratio, plan recheck 1 yr (Gary Morgan).   . History of anemia due to chronic kidney disease   . History of pyelonephritis 04/2016  . History of sepsis 04/2016   e coli  . Hypertension    . Kidney stones    one episode  . Recurrent UTI 2017  . 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 (04/2016 CT)  . Urge incontinence     Past Surgical History:  Procedure Laterality Date  . CATARACT EXTRACTION, BILATERAL    . HOLEP-LASER ENUCLEATION OF THE PROSTATE WITH MORCELLATION N/A 11/18/2019   Path benign. Procedure: HOLEP-LASER ENUCLEATION OF THE PROSTATE WITH MORCELLATION;  Surgeon: Gary Co, MD;  Location: [facility] ORS;  Service: Urology;  Laterality: N/A;  . TONSILLECTOMY  1940  . VENTRAL HERNIA REPAIR  2010   with strangulation of SB (approx 1 foot of SB had to be excised).  Had 3 total surgeries for this due to complications    Family History  Problem Relation Age of Onset  . Breast cancer Mother   . Diabetes Mother   . Bladder Cancer Neg Hx   . Kidney cancer Neg Hx   . Prostate cancer Neg Hx     Social History   Socioeconomic History  . Marital status: Married    Spouse name: Not on file  . Number of children: Not on file  . Years of education: Not on file  . Highest education level: Not on file  Occupational History  . Not on file  Tobacco Use  . Smoking status: Former Smoker    Packs/day: 0.25    Years: 5.00    Pack years: 1.25    Types: Cigarettes    Quit date: 06/30/1978    Years since quitting: 41.4  . Smokeless tobacco: Never Used  Substance and Sexual Activity  . Alcohol use: No  . Drug use: No  . Sexual activity: Not on file  Other Topics Concern  . Not on file  Social History Narrative   Married, has 2 step daughters and 3 biologic children.  Has some grandchildren.   Relocated from [place] to [facility] 08/2013.   Retired [employer].   No T/A/Ds.   Social Determinants of Health   Financial Resource Strain:   . Difficulty of Paying Living Expenses:   Food Insecurity:   . Worried About Charity fundraiser in the Last Year:   . Arboriculturist in the Last Year:   Transportation Needs:   . Lexicographer (Medical):   Marland Kitchen Lack of Transportation (Non-Medical):   Physical Activity:   . Days of Exercise per Week:   . Minutes of Exercise per Session:   Stress:   . Feeling of Stress :   Social Connections:   . Frequency of Communication with Friends and Family:   . Frequency of Social Gatherings with Friends and Family:   . Attends Religious Services:   . Active Member of Clubs or Organizations:   . Attends Archivist Meetings:   Marland Kitchen Marital Status:  Intimate Partner Violence:   . Fear of Current or Ex-Partner:   . Emotionally Abused:   Marland Kitchen Physically Abused:   . Sexually Abused:     Outpatient Medications Prior to Visit  Medication Sig Dispense Refill  . hydrALAZINE (APRESOLINE) 10 MG tablet Take 10 mg by mouth at bedtime.    Marland Kitchen lisinopril-hydrochlorothiazide (ZESTORETIC) 20-12.5 MG tablet Take 1 tablet by mouth once daily (Patient taking differently: Take 1 tablet by mouth daily. ) 90 tablet 0  . metoprolol succinate (TOPROL-XL) 50 MG 24 hr tablet TAKE 1 TABLET BY MOUTH ONCE DAILY OR  IMMEDIATELY  FOLLOWING  A  MEAL 90 tablet 0  . Multiple Vitamin (MULTIVITAMIN) tablet Take 1 tablet by mouth every 3 (three) days.     . polyethylene glycol powder (GLYCOLAX/MIRALAX) 17 GM/SCOOP powder Take 17 g by mouth 2 (two) times daily. 850 g 1   No facility-administered medications prior to visit.    Allergies  Allergen Reactions  . Amlodipine Other (See Comments)    Disequilibrium, generalized weakness.    PE; Vitals with BMI 12/07/2019 11/21/2019 11/18/2019  Height 5\' 8"  5\' 8"  -  Weight 172 lbs 10 oz 182 lbs -  BMI 93.79 02.40 -  Systolic 973 532 992  Diastolic 426 86 85  Pulse 57 62 71  Repeat bp manually today was 140/90  Gen: Alert, tired-appearing and walks slowly with cane.  Patient is oriented to person, place, time, and situation. CV: RRR, no m/r/g.   LUNGS: CTA bilat, nonlabored resps, good aeration in all lung fields. EXT: no clubbing or cyanosis.  no edema.   Foot exam --no swelling, tenderness or skin or vascular lesions. Color and temperature is normal. Sensation is intact. Peripheral pulses are palpable. Toenails are normal.   Pertinent labs:  No results found for: TSH  No results found for: VITAMINB12 No results found for: FOLATE  Lab Results  Component Value Date   WBC 12.3 (H) 08/10/2019   HGB 16.5 08/10/2019   HCT 47.4 08/10/2019   MCV 91.3 08/10/2019   PLT 156 08/10/2019   Lab Results  Component Value Date   CREATININE 2.67 (H) 08/10/2019   BUN 34 (H) 08/10/2019   NA 139 08/10/2019   K 4.4 08/10/2019   CL 101 08/10/2019   CO2 23 08/10/2019   Lab Results  Component Value Date   ALT 11 12/21/2018   AST 11 12/21/2018   ALKPHOS 68 12/21/2018   BILITOT 0.8 12/21/2018   Lab Results  Component Value Date   CHOL 178 07/12/2019   Lab Results  Component Value Date   HDL 41.40 07/12/2019   Lab Results  Component Value Date   LDLCALC 106 (H) 07/12/2019   Lab Results  Component Value Date   TRIG 154.0 (H) 07/12/2019   Lab Results  Component Value Date   CHOLHDL 4 07/12/2019   Lab Results  Component Value Date   PSA 7.0 07/11/2016   Lab Results  Component Value Date   HGBA1C 6.1 07/12/2019   ASSESSMENT AND PLAN:   1) DM 2, diet controlled.   Feet exam normal today. HbA1c,, CMET  today.  2) HTN: bp's not optimal but acceptable : goal for him is avg 140/85. Lytes/cr today.  3) CRI IV (GFR 20s): obstructive uropathy was contributing to this. Hopefully getting his HOLEP procedure will prevent any progression of his renal insufficiency from this problem. We have to accept fair bp control b/c of pt feeling bad/worse chronic disequilibrium when bp  a little low.  4) Fatigue: chronic, multifactorial (age/debilitation, deconditioning, chronic dz). Will check TSH, Vit B12, and vit D today.  5) BPH with urinary obstruction: resolved since having HOLEP procedure a couple weeks ago. Having severe dysuria and  anal/rectal pain sitting still--he sees urologist later today for further eval of this.  An After Visit Summary was printed and given to the patient.  FOLLOW UP:  Return in about 6 months (around 06/07/2020) for routine chronic illness f/u.  Signed:  Crissie Sickles, MD           12/07/2019

## 2019-12-07 NOTE — Progress Notes (Signed)
   12/07/2019 2:47 PM   Gary Morgan 11/19/1931 [id number]  Reason for visit: Follow up HoLEP  HPI: I saw [name] back in urology clinic as an add-on today for follow-up after undergoing a HOLEP.  He is a 86 year old male who previously had recurrent urinary tract infections and urinary retention and underwent an uncomplicated HOLEP on 5/36/1443 with removal of 88 g of tissue and no malignancy seen.  He has been voiding spontaneously with a strong stream, and PVR has been normal.  He reports he did fine in the first week after surgery, however since then has had some severe burning with urination, as well as some pain with sitting behind the scrotum.  He denies any fevers, chills, or gross hematuria.  He continues to urinate with a very strong stream and does not have any urgency or incontinence.  PVR 70 mL.  Urinalysis today greater than 30 RBCs , greater than 30 WBCs, no bacteria, nitrite negative, 1+ leukocytes.  Sent for culture.  On exam the meatus is widely patent with no erythema.  The testicles are descended and 20 cc bilaterally and nontender, there is no perineal tenderness.  Grossly normal GU exam.  I was very frank with the patient that I am not sure the cause of his symptoms, and patients typically do not have pain this far out from HOLEP procedure.  I recommended antibiotics for possible UTI, and send the urine for culture.  I also recommended Pyridium over-the-counter as needed for dysuria.  I anticipate his symptoms will continue to resolve over the next few weeks.  Would need to consider cystoscopy in the future if culture negative and refractory urinary symptoms.  Cipro 500 mg twice daily x7 days for possible UTI, call with culture results Pyridium as needed for dysuria Keep 6-week follow-up as scheduled   Billey Co, MD  [place] [address], [place],  [address] [phone]

## 2019-12-08 LAB — URINALYSIS, COMPLETE
Bilirubin, UA: NEGATIVE
Glucose, UA: NEGATIVE
Ketones, UA: NEGATIVE
Nitrite, UA: NEGATIVE
Specific Gravity, UA: 1.02 (ref 1.005–1.030)
Urobilinogen, Ur: 0.2 mg/dL (ref 0.2–1.0)
pH, UA: 5.5 (ref 5.0–7.5)

## 2019-12-08 LAB — MICROSCOPIC EXAMINATION
Bacteria, UA: NONE SEEN
RBC, Urine: >30 /hpf — AB (ref 0–2)
WBC, UA: >30 /hpf — AB (ref 0–5)

## 2019-12-13 ENCOUNTER — Other Ambulatory Visit: Payer: Self-pay

## 2019-12-13 ENCOUNTER — Ambulatory Visit (INDEPENDENT_AMBULATORY_CARE_PROVIDER_SITE_OTHER): Payer: Medicare Other | Admitting: Family Medicine

## 2019-12-13 ENCOUNTER — Encounter: Payer: Self-pay | Admitting: Family Medicine

## 2019-12-13 DIAGNOSIS — N184 Chronic kidney disease, stage 4 (severe): Secondary | ICD-10-CM

## 2019-12-13 DIAGNOSIS — I10 Essential (primary) hypertension: Secondary | ICD-10-CM | POA: Diagnosis not present

## 2019-12-13 DIAGNOSIS — E119 Type 2 diabetes mellitus without complications: Secondary | ICD-10-CM | POA: Diagnosis not present

## 2019-12-13 DIAGNOSIS — R5383 Other fatigue: Secondary | ICD-10-CM

## 2019-12-13 LAB — COMPREHENSIVE METABOLIC PANEL
ALT: 9 U/L (ref 0–53)
AST: 15 U/L (ref 0–37)
Albumin: 4.2 g/dL (ref 3.5–5.2)
Alkaline Phosphatase: 67 U/L (ref 39–117)
BUN: 41 mg/dL — ABNORMAL HIGH (ref 6–23)
CO2: 29 mEq/L (ref 19–32)
Calcium: 9.3 mg/dL (ref 8.4–10.5)
Chloride: 100 mEq/L (ref 96–112)
Creatinine, Ser: 2.71 mg/dL — ABNORMAL HIGH (ref 0.40–1.50)
GFR: 22.44 mL/min — ABNORMAL LOW (ref >60.00)
Glucose, Bld: 100 mg/dL — ABNORMAL HIGH (ref 70–99)
Potassium: 4.2 mEq/L (ref 3.5–5.1)
Sodium: 138 mEq/L (ref 135–145)
Total Bilirubin: 0.8 mg/dL (ref 0.2–1.2)
Total Protein: 6.7 g/dL (ref 6.0–8.3)

## 2019-12-13 LAB — POCT GLYCOSYLATED HEMOGLOBIN (HGB A1C)
HbA1c POC (<> result, manual entry): 5.7 % (ref 4.0–5.6)
HbA1c, POC (prediabetic range): 5.7 % (ref 5.7–6.4)
Hemoglobin A1C: 5.7 % — AB (ref 4.0–5.6)

## 2019-12-13 LAB — VITAMIN D 25 HYDROXY (VIT D DEFICIENCY, FRACTURES): VITD: 44.59 ng/mL (ref 30.00–100.00)

## 2019-12-13 LAB — VITAMIN B12: Vitamin B-12: 333 pg/mL (ref 211–911)

## 2019-12-13 LAB — TSH: TSH: 1.86 u[IU]/mL (ref 0.35–4.50)

## 2019-12-14 ENCOUNTER — Encounter: Payer: Self-pay | Admitting: Family Medicine

## 2019-12-14 ENCOUNTER — Telehealth (INDEPENDENT_AMBULATORY_CARE_PROVIDER_SITE_OTHER): Payer: Medicare Other | Admitting: Urology

## 2019-12-14 DIAGNOSIS — R300 Dysuria: Secondary | ICD-10-CM

## 2019-12-14 DIAGNOSIS — N138 Other obstructive and reflux uropathy: Secondary | ICD-10-CM

## 2019-12-14 DIAGNOSIS — N401 Enlarged prostate with lower urinary tract symptoms: Secondary | ICD-10-CM

## 2019-12-14 NOTE — Telephone Encounter (Signed)
Please advise if patient should contact Urologist

## 2019-12-14 NOTE — Progress Notes (Signed)
Virtual Visit via Telephone Note  I connected with Gary Morgan on 12/14/19 at 11:30 AM EDT by telephone and verified that I am speaking with the correct person using two identifiers.   I discussed the limitations, risks, security and privacy concerns of performing an evaluation and management service by telephone and the availability of in person appointments. We discussed the impact of the COVID-19 pandemic on the healthcare system, and the importance of social distancing and reducing patient and provider exposure. I also discussed with the patient that there may be a patient responsible charge related to this service. The patient expressed understanding and agreed to proceed.  Reason for visit: Follow-up dysuria  History of Present Illness: I had phone follow-up with Gary Morgan regarding dysuria after HOLEP.  He is a 86 year old male who underwent an uncomplicated HOLEP with removal of 90 g of tissue on 11/18/2019.  He has been voiding with a very strong stream and PVRs been normal.  Preop he had significantly elevated PVRs and history of recurrent urosepsis.  I saw him in clinic on 12/07/2019 for dysuria and pelvic pain.  Urinalysis showed greater than 30 RBCs, greater than 30 WBCs, no bacteria, nitrite negative, 1+ leukocytes.  Culture was requested but not completed.  He was treated with a 7-day course of Cipro for possible UTI, and Pyridium as needed for the dysuria.  He reports his symptoms have completely resolved since that time and he continues to do well.  He denies any complaints today.   Follow Up: 101-month follow-up with IPSS/PVR for standard HOLEP follow-up   I discussed the assessment and treatment plan with the patient. The patient was provided an opportunity to ask questions and all were answered. The patient agreed with the plan and demonstrated an understanding of the instructions.   The patient was advised to call back or seek an in-person evaluation if the symptoms worsen  or if the condition fails to improve as anticipated.  I provided 5 minutes of non-face-to-face time during this encounter.   Billey Co, MD

## 2019-12-15 NOTE — Telephone Encounter (Signed)
Needs to contact his urologist and ask this question.

## 2019-12-16 ENCOUNTER — Other Ambulatory Visit: Payer: Self-pay | Admitting: Family Medicine

## 2019-12-21 ENCOUNTER — Telehealth: Payer: Medicare Other | Admitting: Urology

## 2020-01-26 ENCOUNTER — Encounter: Payer: Self-pay | Admitting: Family Medicine

## 2020-02-20 ENCOUNTER — Other Ambulatory Visit: Payer: Self-pay | Admitting: Family Medicine

## 2020-02-27 ENCOUNTER — Telehealth: Payer: Self-pay

## 2020-02-27 NOTE — Telephone Encounter (Signed)
Would encourage timed voiding to see if that helps and avoid bladder irritants, really need to see him in person with a PVR to confirm hes emptying ok before starting any medication- he has had retention multiple times before. Recommend in person visit with UA, culture, and PVR, thanks   Thanks Nickolas Madrid, MD 02/27/2020

## 2020-02-27 NOTE — Telephone Encounter (Signed)
Called pt's wife, informed her of the information below, wife gave verbal understanding. Wife states she will have to find transportation for an appt. She will call her contacts and let us know. In the meantime will have pt continue timed voiding and d/c caffeine drinks.

## 2020-04-15 DIAGNOSIS — R58 Hemorrhage, not elsewhere classified: Secondary | ICD-10-CM | POA: Diagnosis not present

## 2020-04-15 DIAGNOSIS — S0285XA Fracture of orbit, unspecified, initial encounter for closed fracture: Secondary | ICD-10-CM | POA: Diagnosis not present

## 2020-04-15 DIAGNOSIS — S0990XA Unspecified injury of head, initial encounter: Secondary | ICD-10-CM | POA: Diagnosis not present

## 2020-04-15 DIAGNOSIS — M5030 Other cervical disc degeneration, unspecified cervical region: Secondary | ICD-10-CM | POA: Diagnosis not present

## 2020-04-15 DIAGNOSIS — G319 Degenerative disease of nervous system, unspecified: Secondary | ICD-10-CM | POA: Diagnosis not present

## 2020-04-15 DIAGNOSIS — R609 Edema, unspecified: Secondary | ICD-10-CM | POA: Diagnosis not present

## 2020-04-15 DIAGNOSIS — Z7982 Long term (current) use of aspirin: Secondary | ICD-10-CM | POA: Diagnosis not present

## 2020-04-15 DIAGNOSIS — G8911 Acute pain due to trauma: Secondary | ICD-10-CM | POA: Diagnosis not present

## 2020-04-15 DIAGNOSIS — Z79899 Other long term (current) drug therapy: Secondary | ICD-10-CM | POA: Diagnosis not present

## 2020-04-15 DIAGNOSIS — Z043 Encounter for examination and observation following other accident: Secondary | ICD-10-CM | POA: Diagnosis not present

## 2020-04-15 DIAGNOSIS — S0181XA Laceration without foreign body of other part of head, initial encounter: Secondary | ICD-10-CM | POA: Diagnosis not present

## 2020-04-15 DIAGNOSIS — Z888 Allergy status to other drugs, medicaments and biological substances status: Secondary | ICD-10-CM | POA: Diagnosis not present

## 2020-04-15 DIAGNOSIS — I129 Hypertensive chronic kidney disease with stage 1 through stage 4 chronic kidney disease, or unspecified chronic kidney disease: Secondary | ICD-10-CM | POA: Diagnosis not present

## 2020-04-15 DIAGNOSIS — N184 Chronic kidney disease, stage 4 (severe): Secondary | ICD-10-CM | POA: Diagnosis not present

## 2020-04-15 DIAGNOSIS — I959 Hypotension, unspecified: Secondary | ICD-10-CM | POA: Diagnosis not present

## 2020-04-15 DIAGNOSIS — S01112A Laceration without foreign body of left eyelid and periocular area, initial encounter: Secondary | ICD-10-CM | POA: Diagnosis not present

## 2020-04-15 DIAGNOSIS — S0232XA Fracture of orbital floor, left side, initial encounter for closed fracture: Secondary | ICD-10-CM | POA: Diagnosis not present

## 2020-04-15 DIAGNOSIS — E1122 Type 2 diabetes mellitus with diabetic chronic kidney disease: Secondary | ICD-10-CM | POA: Diagnosis not present

## 2020-04-15 DIAGNOSIS — Z87891 Personal history of nicotine dependence: Secondary | ICD-10-CM | POA: Diagnosis not present

## 2020-04-15 DIAGNOSIS — I10 Essential (primary) hypertension: Secondary | ICD-10-CM | POA: Diagnosis not present

## 2020-04-15 DIAGNOSIS — S0083XA Contusion of other part of head, initial encounter: Secondary | ICD-10-CM | POA: Diagnosis not present

## 2020-04-15 DIAGNOSIS — W19XXXA Unspecified fall, initial encounter: Secondary | ICD-10-CM | POA: Diagnosis not present

## 2020-04-17 DIAGNOSIS — S0230XA Fracture of orbital floor, unspecified side, initial encounter for closed fracture: Secondary | ICD-10-CM | POA: Diagnosis not present

## 2020-04-18 DIAGNOSIS — S0232XS Fracture of orbital floor, left side, sequela: Secondary | ICD-10-CM | POA: Diagnosis not present

## 2020-04-18 DIAGNOSIS — H35373 Puckering of macula, bilateral: Secondary | ICD-10-CM | POA: Diagnosis not present

## 2020-04-18 DIAGNOSIS — S0512XS Contusion of eyeball and orbital tissues, left eye, sequela: Secondary | ICD-10-CM | POA: Diagnosis not present

## 2020-04-29 ENCOUNTER — Other Ambulatory Visit: Payer: Self-pay | Admitting: Family Medicine

## 2020-05-04 ENCOUNTER — Telehealth: Payer: Self-pay

## 2020-05-04 NOTE — Telephone Encounter (Signed)
Patient refill request. Patient has an appt scheduled for 12/9 Does not have enough meds to last until appt  metoprolol succinate (TOPROL-XL) 50 MG 24 hr tablet [[id number]]    [facility] [address]

## 2020-05-07 ENCOUNTER — Other Ambulatory Visit: Payer: Self-pay | Admitting: Family Medicine

## 2020-05-08 ENCOUNTER — Encounter: Payer: Self-pay | Admitting: Family Medicine

## 2020-05-08 ENCOUNTER — Other Ambulatory Visit: Payer: Self-pay

## 2020-05-08 MED ORDER — METOPROLOL SUCCINATE ER 50 MG PO TB24: ORAL_TABLET | ORAL | 0 refills | Status: DC

## 2020-05-08 NOTE — Telephone Encounter (Signed)
Pharmacy called for refill of this medication. Patient has upcoming appt on 12/9.

## 2020-05-08 NOTE — Telephone Encounter (Signed)
30 day supply sent. Patient was notified and apologized for the delay.

## 2020-05-09 ENCOUNTER — Encounter: Payer: Self-pay | Admitting: Urology

## 2020-05-09 ENCOUNTER — Other Ambulatory Visit: Payer: Self-pay

## 2020-05-09 ENCOUNTER — Ambulatory Visit (INDEPENDENT_AMBULATORY_CARE_PROVIDER_SITE_OTHER): Payer: Medicare Other | Admitting: Urology

## 2020-05-09 VITALS — BP 209/106 | HR 58 | Ht 69.0 in | Wt 170.0 lb

## 2020-05-09 DIAGNOSIS — N401 Enlarged prostate with lower urinary tract symptoms: Secondary | ICD-10-CM | POA: Diagnosis not present

## 2020-05-09 DIAGNOSIS — N138 Other obstructive and reflux uropathy: Secondary | ICD-10-CM | POA: Diagnosis not present

## 2020-05-09 DIAGNOSIS — N390 Urinary tract infection, site not specified: Secondary | ICD-10-CM

## 2020-05-09 LAB — BLADDER SCAN AMB NON-IMAGING

## 2020-05-09 NOTE — Patient Instructions (Signed)
Try to urinate every 2-3 hours during the day before your bladder gets too full and you have urgency, frequency, and leakage.  Stop drinking fluids after 5 or 6 PM at night, and urinate twice right before going to bed.  Try this for at least 1 to 2 weeks, but if after that time you are still having bothersome leakage of urine and getting up overnight 3-4 times, start the medication Myrbetriq.  This is taken once daily.  Call if any questions or problems.  We will set up a phone call in 1 month to check on how you are doing.

## 2020-05-09 NOTE — Progress Notes (Addendum)
° °  05/09/2020 12:21 PM   Gary Morgan 28-Dec-1931 [id number]  Reason for visit: Follow up BPH, history of urinary retention, history of recurrent UTIs  HPI: I saw Gary Morgan back in urology clinic with his daughter for persistent symptoms after undergoing HOLEP on 11/18/2019.  Briefly, he is an 86 year old male with a long history of urinary symptoms including incomplete emptying, recurrent UTIs and sepsis, and urinary retention with PVRs greater than 500 mL.  He underwent urodynamics that showed bladder outlet obstruction with high-pressure, low-flow voiding, max capacity of 590 mL, and unstable contraction around 450 mL.  He was noncompliant with CIC and was developing recurrent UTIs.  He also had impaired renal function with bilateral hydronephrosis and a distended bladder.  Postoperatively, he has been voiding spontaneously with a strong stream and low PVRs.  PVR today is normal at 27 mL.  Urinalysis is benign with 0-5 WBCs, 0-2 RBCs, no bacteria, nitrite negative, no leukocytes.  His primary complaint is persistent urgency and frequency during the day, and primarily overnight with some leakage overnight requiring a depends.  He is having nocturia 4-5 times overnight.  It sounds like his wife is encouraging him to push fluids in the evening, and I suspect that is part of the reason he is having so much nocturia.  He is drinking only water during the day.  He is having minimal dysuria and no further gross hematuria.  He has had no UTIs or retention since undergoing surgery.  Renal function has improved with most recent creatinine of 2.5, EGFR 22 from creatinine of 4.28, EGFR 12.  Notably, his IPSS score today is 7 from IPSS 14 preoperatively.  We long conversation about his urinary symptoms today.  I suspect he has a component of pushing fluids in the evening at his wife's behest which is probably contributing to his nocturia.  We discussed the importance of timed voiding, minimizing fluids 3 to  4 hours before bedtime, and double voiding prior to bedtime.  We also discussed consideration of trying Myrbetriq daily for overactive type symptoms if he does not have improvement over the next 1 to 2 weeks with the strategies above.  Behavioral strategies discussed at length If no improvement over the next 2 weeks, trial Myrbetriq 50 mg daily, samples given x1 month Virtual visit 1 month symptom check  Billey Co, MD  [place] [address] [place], [place] [address] [phone]

## 2020-05-10 LAB — MICROSCOPIC EXAMINATION: Bacteria, UA: NONE SEEN

## 2020-05-10 LAB — URINALYSIS, COMPLETE
Bilirubin, UA: NEGATIVE
Glucose, UA: NEGATIVE
Ketones, UA: NEGATIVE
Leukocytes,UA: NEGATIVE
Nitrite, UA: NEGATIVE
RBC, UA: NEGATIVE
Specific Gravity, UA: 1.02 (ref 1.005–1.030)
Urobilinogen, Ur: 0.2 mg/dL (ref 0.2–1.0)
pH, UA: 5.5 (ref 5.0–7.5)

## 2020-05-23 DIAGNOSIS — S0512XS Contusion of eyeball and orbital tissues, left eye, sequela: Secondary | ICD-10-CM | POA: Diagnosis not present

## 2020-05-23 DIAGNOSIS — S0232XS Fracture of orbital floor, left side, sequela: Secondary | ICD-10-CM | POA: Diagnosis not present

## 2020-06-05 ENCOUNTER — Telehealth: Payer: Medicare Other | Admitting: Urology

## 2020-06-07 ENCOUNTER — Other Ambulatory Visit: Payer: Self-pay

## 2020-06-07 ENCOUNTER — Ambulatory Visit (INDEPENDENT_AMBULATORY_CARE_PROVIDER_SITE_OTHER): Payer: Medicare Other | Admitting: Family Medicine

## 2020-06-07 ENCOUNTER — Encounter: Payer: Self-pay | Admitting: Family Medicine

## 2020-06-07 VITALS — BP 160/96 | HR 60 | Temp 97.4°F | Resp 16 | Ht 69.0 in | Wt 168.2 lb

## 2020-06-07 DIAGNOSIS — E119 Type 2 diabetes mellitus without complications: Secondary | ICD-10-CM

## 2020-06-07 DIAGNOSIS — N139 Obstructive and reflux uropathy, unspecified: Secondary | ICD-10-CM

## 2020-06-07 DIAGNOSIS — R5382 Chronic fatigue, unspecified: Secondary | ICD-10-CM

## 2020-06-07 DIAGNOSIS — Z23 Encounter for immunization: Secondary | ICD-10-CM | POA: Diagnosis not present

## 2020-06-07 DIAGNOSIS — R293 Abnormal posture: Secondary | ICD-10-CM

## 2020-06-07 DIAGNOSIS — R2689 Other abnormalities of gait and mobility: Secondary | ICD-10-CM | POA: Diagnosis not present

## 2020-06-07 DIAGNOSIS — R251 Tremor, unspecified: Secondary | ICD-10-CM | POA: Diagnosis not present

## 2020-06-07 DIAGNOSIS — I10 Essential (primary) hypertension: Secondary | ICD-10-CM | POA: Diagnosis not present

## 2020-06-07 DIAGNOSIS — N184 Chronic kidney disease, stage 4 (severe): Secondary | ICD-10-CM | POA: Diagnosis not present

## 2020-06-07 MED ORDER — HYDRALAZINE HCL 25 MG PO TABS: 25.0000 mg | ORAL_TABLET | Freq: Three times a day (TID) | ORAL | 1 refills | Status: DC

## 2020-06-07 NOTE — Progress Notes (Signed)
OFFICE VISIT  06/07/2020  CC:  Chief Complaint  Patient presents with  . Follow-up    RCI, pt is not fasting    HPI:    Patient is a 86 y.o. Caucasian male who presents accompanied by his wife for 6 mo f/u DM 2, HTN, CRI IV, fatigue, BPH with LUTS. A/P as of last visit: "1) DM 2, diet controlled.   Feet exam normal today. HbA1c,, CMET  today.  2) HTN: bp's not optimal but acceptable : goal for him is avg 140/85. Lytes/cr today.  3) CRI IV (GFR 20s): obstructive uropathy was contributing to this. Hopefully getting his HOLEP procedure will prevent any progression of his renal insufficiency from this problem. We have to accept fair bp control b/c of pt feeling bad/worse chronic disequilibrium when bp a little low.  4) Fatigue: chronic, multifactorial (age/debilitation, deconditioning, chronic dz). Will check TSH, Vit B12, and vit D today.  5) BPH with urinary obstruction: resolved since having HOLEP procedure a couple weeks ago. Having severe dysuria and anal/rectal pain sitting still--he sees urologist later today for further eval of this."   INTERIM HX: 04/15/20 he fell when walking in yard and his cane got stuck in a hole, hit L side of face.  No LOC, went to [facility] ED, record reviewed entirely today. Sustained L orbital fx and L eyelid lac which was sutured in ED.   Discharged home on augmentin, tylenol. F/u was to be with Dr. Carolan Clines, facial plastics.  Followed up with specialists and all has been healing well. No pain, and vision is normal.  Recent trial of myrbetriq no help, caused constip, only took 8d, stopped 3 wks ago. Pt cancelled f/u with urologist.  Energy level still chronically poor. Taking vit D and mvi. Wt down 4 lb since last 6 mo.  Eats healthy, appetite is good.   Drinks approx 35 oz water daily.  Some coffee, some milk, some OJ. Some periods of feeling a bit more disoriented, goes in a room and feels confused why he's there or needs reorienting,  some more periods of UE tremors lately, ? After taking meds--pt not clear on this point. Home bp 150s/90s avg, HR 60 avg. Takes hydral 30m only once a day. Takes lisin-hct 20-12.5 qd and toprol xl 50 qd.   CT head 04/15/20 Impression:  IMPRESSION: No acute intracranial hemorrhage. Generalized cerebral and cerebellar atrophy and marked periventricular leukoariosis. Partially visualized acute blood within the left maxillary sinus and about the left cheek and orbit.  CT neck 04/15/20: IMPRESSION: No visualized fracture of the cervical spine. Moderate degenerative disc disease and marked facet arthropathy at multiple levels. This causes neural foraminal narrowing at multiple levels.  CT facial bones 04/15/20: FINDINGS: Extensive left periorbital soft tissue swelling with a large hematoma infraorbital region. There is a comminuted fracture floor of the left orbit. Some displacement of fracture fragments into the upper portion of the maxillary sinus. Small extraconal hemorrhage inferiorly and laterally within the orbit. The extraocular muscles and optic nerve sheath complexes appear intact. No significant proptosis or enophthalmos. Some mucosal thickening and hemorrhage present within the left maxillary sinus. Some mucosal thickening/opacification bilateral ethmoid and left frontal sinuses. Mastoid and middle ear cavities are clear. No additional fracture appreciated. Mandibular condyles are located.  Impression:  IMPRESSION: 1. Extensive left periorbital soft tissue swelling with a large hematoma infraorbital region. 2. There is a comminuted fracture for the left orbit. Small extraconal hemorrhage inferiorly and laterally within the left orbit. No  significant proptosis or enophthalmos. 3. Additional findings as above.   ROS: no fevers, no CP, no SOB, no wheezing, no cough, no dizziness, no HAs, no rashes, no melena/hematochezia.  No polyuria or polydipsia.  No myalgias or arthralgias.  No focal  weakness or paresthesias.  No acute vision or hearing abnormalities. No n/v/d or abd pain.  No palpitations.     Past Medical History:  Diagnosis Date  . 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 Dois Davenport (urol).  Dr. Jeffie Pollock suspects this was due to stretch injury and has resolved as of 09/24/16.  . Arthritis of right wrist    s/p fracture of middle third of scaphoid bone sustained cleaning up from [facility] injection by Dr. Napoleon Form in [place] (ortho) made the pain go away.  Marland Kitchen BPH with obstruction/lower urinary tract symptoms    hx of urinary retention (s/p foley cath when in hosp for urosepsis 04/2016).  +Areflexic bladder.  TURP will not help since he has acontractile bladder.  Options are CIC, indwelling urethral foley, or suprapubic catheter, HOLEP or simple prostatectomy.  HOLEP done 10/2019.  Marland Kitchen Chronic low back pain   . Chronic renal insufficiency, stage 4 (severe) (HCC)    CrCl 30s (saw nephrologist in [place]). +microalbuminuria 04/2014 at PCP in [place].  Stage IV 2018: GFR upper 20s. Obstruc urop contrib, bilat hydroureteroneph->HoLEP 11/2019  . COPD (chronic obstructive pulmonary disease) ([place])    on CXR 09/27/2015  . Diabetes mellitus ([place])    2015 HbA1c 6.2-6.3 %.  HbA1c 06/2016 5.4%.  A1c 6.5% 05/2017 (this was his first A1c of 6.5% or more)--nutritionist referral recommended.  . Diverticulosis 04/2016   Noted on CT 04/2016  . Elevated PSA 06/2016   Dr. Redmond Pulling ([facility] urologist)-awaiting records (pt's wife reported pt's psa was 7).  Dr. Jeffie Pollock feels like this may have been a result of relatively recent episode of urinary retention, recheck PSA 12/29/16 was lower: 4.64, 24% f/t ratio, plan recheck 1 yr (Dr. Jeffie Pollock).   . History of anemia due to chronic kidney disease   . History of pyelonephritis 04/2016  . History of sepsis 04/2016   e coli  . Hypertension   . Kidney stones    one episode  . Recurrent UTI 2017  . 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 (04/2016 CT)  . Urge incontinence     Past Surgical History:  Procedure Laterality Date  . CATARACT EXTRACTION, BILATERAL    . HOLEP-LASER ENUCLEATION OF THE PROSTATE WITH MORCELLATION N/A 11/18/2019   Path benign. Procedure: HOLEP-LASER ENUCLEATION OF THE PROSTATE WITH MORCELLATION;  Surgeon: Billey Co, MD;  Location: [facility] ORS;  Service: Urology;  Laterality: N/A;  . TONSILLECTOMY  1940  . VENTRAL HERNIA REPAIR  2010   with strangulation of SB (approx 1 foot of SB had to be excised).  Had 3 total surgeries for this due to complications    Outpatient Medications Prior to Visit  Medication Sig Dispense Refill  . Cholecalciferol (VITAMIN D3 PO) Take 400 Units by mouth daily.    Marland Kitchen lisinopril-hydrochlorothiazide (ZESTORETIC) 20-12.5 MG tablet Take 1 tablet by mouth once daily 90 tablet 0  . metoprolol succinate (TOPROL-XL) 50 MG 24 hr tablet TAKE 1 TABLET BY MOUTH ONCE DAILY WITH MEALS OR  IMMEDIATELY  FOLLOWING  MEAL 30 tablet 0  . Multiple Vitamin (MULTIVITAMIN) tablet Take 1 tablet by mouth daily.    . hydrALAZINE (APRESOLINE) 10 MG tablet Take  10 mg by mouth at bedtime.     No facility-administered medications prior to visit.    Allergies  Allergen Reactions  . Amlodipine Other (See Comments)    Disequilibrium, generalized weakness.    ROS As per HPI  PE: Vitals with BMI 06/07/2020 05/09/2020 12/07/2019  Height 5' 9"  5' 9"  5' 8"   Weight 168 lbs 3 oz 170 lbs 170 lbs  BMI 24.83 36.64 40.34  Systolic 742 595 638  Diastolic 96 756 94  Pulse 60 58 67   Gen: alert, NAD, uses a walker, pleasant and conversational, normal range of facial expression. Face: no orbital swelling or bruising.  EOMI, PERRLA CV: RRR, no m/r/g.   LUNGS: CTA bilat, nonlabored resps, good aeration in all lung fields. EXT: no clubbing or cyanosis.  no edema.  Neuro: CN 2-12 intact bilaterally, strength 5/5 in proximal and distal upper extremities and  lower extremities bilaterally.  No sensory deficits. Fine resting tremor bilat, R>L with hands on knees, equal when hands held outstretched.  Normal FNF.  Gait: very small/short steps, >4 point turns, +postural instability.  No convincing cogwheel rigidity.     LABS:  Lab Results  Component Value Date   TSH 1.86 12/13/2019   Lab Results  Component Value Date   VITAMINB12 333 12/13/2019   04/15/20 WBC 13.7 (*)  RBC 5.00  HGB 15.4  HCT 46.0  MCV 92  MCH 30.8  MCHC 33.5  Plt Ct 177   04/15/20 Na 140  Potassium 4.3  Cl 99  CO2 32  AGAP 9  Glucose 135 (*)  BUN 35 (*)  Creatinine 2.53 (*)  Ca 10.1  ALK PHOS 84  T Bili 0.82  Total Protein 7.0  Alb 4.6  GLOBULIN 2.4  ALBUMIN/GLOBULIN RATIO 1.9  BUN/CREAT RATIO 13.8  ALT 11  AST 17  GFR Non African American 22    Lab Results  Component Value Date   WBC 12.3 (H) 08/10/2019   HGB 16.5 08/10/2019   HCT 47.4 08/10/2019   MCV 91.3 08/10/2019   PLT 156 08/10/2019   Lab Results  Component Value Date   CREATININE 2.71 (H) 12/13/2019   BUN 41 (H) 12/13/2019   NA 138 12/13/2019   K 4.2 12/13/2019   CL 100 12/13/2019   CO2 29 12/13/2019   Lab Results  Component Value Date   ALT 9 12/13/2019   AST 15 12/13/2019   ALKPHOS 67 12/13/2019   BILITOT 0.8 12/13/2019   Lab Results  Component Value Date   CHOL 178 07/12/2019   Lab Results  Component Value Date   HDL 41.40 07/12/2019   Lab Results  Component Value Date   LDLCALC 106 (H) 07/12/2019   Lab Results  Component Value Date   TRIG 154.0 (H) 07/12/2019   Lab Results  Component Value Date   CHOLHDL 4 07/12/2019   Lab Results  Component Value Date   PSA 7.0 07/11/2016   Lab Results  Component Value Date   HGBA1C 5.7 (A) 12/13/2019   HGBA1C 5.7 12/13/2019   HGBA1C 5.7 12/13/2019    IMPRESSION AND PLAN:  1) Resting tremor: per pt this is more problematic of late. +chronic generalized weakness/fatigue, ?shuffling gait, postural  instability. Question parkinsonism: refer to neurology today.  2) HTN, uncontrolled. Inc hydral to 48m tid, cont lisin-hct 20-12.5 qd, cont toprol xl 560mqd. Lytes/cr today.  3) DM, diet controlled. A1c today.  4) Left orbital fracture: healed well, no sequelae.  5) BPH with  LUTS + OAB: he is s/p HOLEP procedure. Intol of myrbetriq trial relatively recently. Pt frustrated and does not want to f/u with urology or pursue any new meds for this at this time.  6) CRI IV: medical renal dz + long hx of obstructive uropathy. Lytes/cr monitoring today. Avoids NSAIDs, hydrates fine.  An After Visit Summary was printed and given to the patient.  FOLLOW UP: Return in about 4 weeks (around 07/05/2020) for f/u HTN.  Signed:  Crissie Sickles, MD           06/07/2020

## 2020-06-07 NOTE — Patient Instructions (Signed)
I'm concerned about possible parkinson's disease so I'm referring Gary Morgan to a neurologist.  I've sent in new prescription for hydralazine 25mg , 1 tab three times every day to try to help your high blood pressure.  Continue other meds at current doses.  Continue to check blood pressure and heart rate daily and we'll review these numbers again in 1 month.

## 2020-06-10 ENCOUNTER — Encounter: Payer: Self-pay | Admitting: Family Medicine

## 2020-06-14 ENCOUNTER — Other Ambulatory Visit: Payer: Self-pay | Admitting: Family Medicine

## 2020-06-14 ENCOUNTER — Ambulatory Visit: Payer: Medicare Other | Admitting: Urology

## 2020-07-03 ENCOUNTER — Ambulatory Visit: Payer: Medicare Other | Admitting: Family Medicine

## 2020-07-05 ENCOUNTER — Other Ambulatory Visit: Payer: Self-pay

## 2020-07-06 ENCOUNTER — Ambulatory Visit: Payer: Medicare Other | Admitting: Family Medicine

## 2020-07-13 ENCOUNTER — Encounter: Payer: Self-pay | Admitting: Family Medicine

## 2020-07-15 DIAGNOSIS — N179 Acute kidney failure, unspecified: Secondary | ICD-10-CM | POA: Diagnosis not present

## 2020-07-15 DIAGNOSIS — M25511 Pain in right shoulder: Secondary | ICD-10-CM | POA: Diagnosis not present

## 2020-07-15 DIAGNOSIS — N202 Calculus of kidney with calculus of ureter: Secondary | ICD-10-CM | POA: Diagnosis not present

## 2020-07-15 DIAGNOSIS — Z79899 Other long term (current) drug therapy: Secondary | ICD-10-CM | POA: Diagnosis not present

## 2020-07-15 DIAGNOSIS — I129 Hypertensive chronic kidney disease with stage 1 through stage 4 chronic kidney disease, or unspecified chronic kidney disease: Secondary | ICD-10-CM | POA: Diagnosis not present

## 2020-07-15 DIAGNOSIS — N138 Other obstructive and reflux uropathy: Secondary | ICD-10-CM | POA: Diagnosis not present

## 2020-07-15 DIAGNOSIS — Z87891 Personal history of nicotine dependence: Secondary | ICD-10-CM | POA: Diagnosis not present

## 2020-07-15 DIAGNOSIS — K7689 Other specified diseases of liver: Secondary | ICD-10-CM | POA: Diagnosis not present

## 2020-07-15 DIAGNOSIS — E1122 Type 2 diabetes mellitus with diabetic chronic kidney disease: Secondary | ICD-10-CM | POA: Diagnosis not present

## 2020-07-15 DIAGNOSIS — N401 Enlarged prostate with lower urinary tract symptoms: Secondary | ICD-10-CM | POA: Diagnosis not present

## 2020-07-15 DIAGNOSIS — N390 Urinary tract infection, site not specified: Secondary | ICD-10-CM | POA: Diagnosis not present

## 2020-07-15 DIAGNOSIS — N281 Cyst of kidney, acquired: Secondary | ICD-10-CM | POA: Diagnosis not present

## 2020-07-15 DIAGNOSIS — Z7982 Long term (current) use of aspirin: Secondary | ICD-10-CM | POA: Diagnosis not present

## 2020-07-15 DIAGNOSIS — Z888 Allergy status to other drugs, medicaments and biological substances status: Secondary | ICD-10-CM | POA: Diagnosis not present

## 2020-07-15 DIAGNOSIS — N184 Chronic kidney disease, stage 4 (severe): Secondary | ICD-10-CM | POA: Diagnosis not present

## 2020-07-16 ENCOUNTER — Encounter: Payer: Self-pay | Admitting: Family Medicine

## 2020-07-17 ENCOUNTER — Other Ambulatory Visit: Payer: Self-pay

## 2020-07-17 NOTE — Telephone Encounter (Signed)
Needs home health referral for needs assessment; dx debilitated patient, generalized weakness, gait instability, chronic renal insufficiency stage 4, HTN, DM. If not already set up with our lab or with his urologist then he needs non-fasting lab with [place] at his earliest convenience this week to get BMET, dx chronic renal insufficiency stage 4-thx

## 2020-07-17 NOTE — Telephone Encounter (Signed)
Per medicaid rules: have to wait until appointment to place referral or any home health orders.

## 2020-07-17 NOTE — Telephone Encounter (Signed)
Per medicare rules have to wait for encounter to place order.

## 2020-07-18 ENCOUNTER — Inpatient Hospital Stay: Payer: Medicare Other | Admitting: Family Medicine

## 2020-07-18 ENCOUNTER — Ambulatory Visit (INDEPENDENT_AMBULATORY_CARE_PROVIDER_SITE_OTHER): Payer: Medicare Other | Admitting: Urology

## 2020-07-18 ENCOUNTER — Encounter: Payer: Self-pay | Admitting: Urology

## 2020-07-18 VITALS — BP 112/70 | HR 76 | Ht 69.0 in

## 2020-07-18 DIAGNOSIS — N138 Other obstructive and reflux uropathy: Secondary | ICD-10-CM | POA: Diagnosis not present

## 2020-07-18 DIAGNOSIS — R339 Retention of urine, unspecified: Secondary | ICD-10-CM

## 2020-07-18 DIAGNOSIS — N401 Enlarged prostate with lower urinary tract symptoms: Secondary | ICD-10-CM

## 2020-07-18 DIAGNOSIS — N390 Urinary tract infection, site not specified: Secondary | ICD-10-CM

## 2020-07-18 NOTE — Progress Notes (Signed)
   07/18/2020 3:40 PM   Dawayne Patricia 03/22/1932 [id number]  Reason for visit: Urinary retention  HPI: I saw Mr. Spike and his daughter in clinic today.  Briefly he is an 87 year old male with a long history of urinary symptoms including incomplete emptying, recurrent UTIs and sepsis, and urinary retention with PVRs greater than 500 mL.  He underwent urodynamics that showed bladder outlet obstruction with high-pressure, low-flow voiding, max capacity of 590 mL, and unstable contraction around 450 mL.  He was non-compliant with CIC and was developing recurrent UTIs.  He also had impaired renal function with bilateral hydronephrosis and a distended bladder.  He has CKD at baseline.  He underwent a HOLEP on 11/18/2019 with removal of 90 g of benign tissue and had been doing well postoperatively voiding with a strong stream and low PVRs, most recently 27 mL in November 2021.  Urinalysis was also completely benign at that visit.  He was having some persistent urgency and frequency during the day as well as overnight, but he was pushing fluids in the evening contributing to his nocturia.  We tried a course of Myrbetriq previously without significant improvement in his frequency.  He was recently seen in the [facility] ED on 07/15/2020 and diagnosed with a UTI.  I reviewed the ED notes.  It sounds like he had severe constipation leading up to that, as well as tried to self catheterize.  It is unclear if his retention was secondary to constipation, or UTI that was initiated by his attempts at self-catheterization.  A CT was also performed that showed a distended bladder and mild hydronephrosis down to the bladder and a Foley catheter was placed.  Unfortunately the films were not available for me to review.  Urine culture ultimately grew Klebsiella, and he is on culture appropriate Keflex.  His catheter is clear yellow today.  I had a very long conversation with the patient and his daughter about possible  etiologies, and I suspect his retention and UTI were secondary to severe constipation/fecal impaction, versus attempted catheterization.  Reassurance was provided at length.  We discussed the low, but non-zero, risk of developing recurrent retention and requiring catheter replacement.  Certainly if he develops recurrent episodes of retention, will need to perform cystoscopy, though after HOLEP I would anticipate his bladder would be wide open.  He has a history of possible atonic bladder which also may be contributory, though urodynamic studies did show bladder contractions as recently as spring 2021.  -Foley removed today -Keflex for UTI -Okay to take Pyridium for dysuria -Will call next week to check in -RTC 3 months with PVR  Billey Co, MD  [place] [address] [place], [place] [address] [phone]

## 2020-07-18 NOTE — Patient Instructions (Signed)
Avoid constipation, as this can cause both urinary infections and difficulty with urination.  Finish course of antibiotics given by ED.  Can take Pyridium over-the-counter for any burning with urination as UTI symptoms improved.  Drink plenty of fluids, and try to empty your bladder every few hours to prevent from overfilling.  Okay to minimize fluids before bedtime to decrease overnight urination

## 2020-07-18 NOTE — Progress Notes (Signed)
OFFICE VISIT  07/19/2020  CC:  Chief Complaint  Patient presents with  . Hospitalization Follow-up   HPI:    Patient is a 87 y.o. Caucasian male who presents accompanied by his wife for f/u ED visit [facility]) 07/16/19 for urinary retention and dysuria. Signif hx of BPH with retention/obst, now s/p HOLEP 10/2019. Also CRI stage 4, hx recurrent UTI/pyelo.  Reviewed entire ED encounter records today: foley cath placed, UA c/w infxn, WBC 13.7 with left shift, sCr 4.0 (GFR 13 ml/min), pt given shot of rocephin and d/c'd home with foley cath in and rx for cephalexin '500mg'$  bid x 7d.  CT abd/pelv w/out (compared to 08/2019 imaging) showed no signif change in bilat hydroureter which is related to markedly distended urinary bladder, elevated by an enlarged prostate gland.  Multiple bilat renal cysts and perinephric stranding not signif changed.  Punctate nonobst calyceal calculus present at L renal lower pole. X-ray of right shoulder d/t hx of fall last month: neg acute.  Of note, last time I saw him about 6 wks ago I increased his hydralazine for better bp control and referred him to neurology d/t concern for possible parkinsonism. We tried to get blood draw at that visit but were unsuccessful.  CURRENTLY: Doing better, went to urol yesterday and foley removed.  Emptying bladder fine since then.   Urine clx from 07/15/20 showed 20K mixed gram pos flora and 100K klebsiella oxytoca sensitive to all except ampicillin.  Was constipated prior to his UTI-->likely contributed to the cause of UTI + drove bp up. Since having BM (miralax) his bp has dropped down to low normal range consistently.    Unstable gait, generalized weakness, has fallen two more times in last 6 wks. Has intermittent inc weakness in R>L leg--last few months. Also R hip/thigh pain when tries to bear wt/walk. This patient has the following mobility-related ADL(s) that will be significantly improved in the home:  Bathing, toileting, moving  room to room safely, meal prep.  2) A cane or walker will not allow for improvement in mobility-related ADL's in home because:  Pt too weak and unstable on his feet.  3) A manual wheelchair will not allow for improvement in mobility-related ADL's in home because:  Pt lacks adequate arms/shoulders/upper body strength to self propel.    4) Pt unable to operate a motorized scooter due to generalized weakness and poor postural stability/balance.  5) In my opinion, this patient can physically and mentally operate the power wheelchair safely.  His overall pain scale is 2/10 (R hip/leg).  ROS: no fevers, no CP, no SOB, no wheezing, no cough, no dizziness, no HAs, no rashes, no melena/hematochezia.  No polyuria or polydipsia.  No focal weakness, paresthesias, or tremors.  No acute vision or hearing abnormalities. No n/v/d or abd pain.  No palpitations.     Past Medical History:  Diagnosis Date  . 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 Dois Davenport (urol).  Dr. Jeffie Pollock suspects this was due to stretch injury and has resolved as of 09/24/16.  . Arthritis of right wrist    s/p fracture of middle third of scaphoid bone sustained cleaning up from [facility] injection by Dr. Napoleon Form in [place] (ortho) made the pain go away.  Marland Kitchen BPH with obstruction/lower urinary tract symptoms    hx of urinary retention (s/p foley cath when in hosp for urosepsis 04/2016).  +Areflexic bladder.  TURP will not help since he has acontractile bladder.  Options are CIC, indwelling  urethral foley, or suprapubic catheter, HOLEP or simple prostatectomy.  HOLEP done 10/2019.  Marland Kitchen Chronic low back pain   . Chronic renal insufficiency, stage 4 (severe) (HCC)    CrCl 30s (saw nephrologist in [place]). +microalbuminuria 04/2014 at PCP in [place].  Stage IV 2018: GFR upper 20s. Obstruc urop contrib, bilat hydroureteroneph->HoLEP 11/2019  . COPD (chronic obstructive pulmonary disease) ([place])    on  CXR 09/27/2015  . Diabetes mellitus ([place])    2015 HbA1c 6.2-6.3 %.  HbA1c 06/2016 5.4%.  A1c 6.5% 05/2017 (this was his first A1c of 6.5% or more)--nutritionist referral recommended.  . Diverticulosis 04/2016   Noted on CT 04/2016  . Elevated PSA 06/2016   Dr. Redmond Pulling ([facility] urologist)-awaiting records (pt's wife reported pt's psa was 7).  Dr. Jeffie Pollock feels like this may have been a result of relatively recent episode of urinary retention, recheck PSA 12/29/16 was lower: 4.64, 24% f/t ratio, plan recheck 1 yr (Dr. Jeffie Pollock).   . History of anemia due to chronic kidney disease   . History of pyelonephritis 04/2016  . History of sepsis 04/2016   e coli  . Hypertension   . Kidney stones    one episode  . Recurrent UTI 2017  . 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 (04/2016 CT)  . Urge incontinence     Past Surgical History:  Procedure Laterality Date  . CATARACT EXTRACTION, BILATERAL    . HOLEP-LASER ENUCLEATION OF THE PROSTATE WITH MORCELLATION N/A 11/18/2019   Path benign. Procedure: HOLEP-LASER ENUCLEATION OF THE PROSTATE WITH MORCELLATION;  Surgeon: Billey Co, MD;  Location: [facility] ORS;  Service: Urology;  Laterality: N/A;  . TONSILLECTOMY  1940  . VENTRAL HERNIA REPAIR  2010   with strangulation of SB (approx 1 foot of SB had to be excised).  Had 3 total surgeries for this due to complications    Outpatient Medications Prior to Visit  Medication Sig Dispense Refill  . cephALEXin (KEFLEX) 500 MG capsule Take by mouth.    . Cholecalciferol (VITAMIN D3 PO) Take 400 Units by mouth daily.    . hydrALAZINE (APRESOLINE) 25 MG tablet Take 1 tablet (25 mg total) by mouth 3 (three) times daily. 90 tablet 1  . metoprolol succinate (TOPROL-XL) 50 MG 24 hr tablet TAKE 1 TABLET BY MOUTH ONCE DAILY WITH MEALS OR  IMMEDIATELY  FOLLOWING  A  MEAL 30 tablet 5  . Multiple Vitamin (MULTIVITAMIN) tablet Take 0.5 tablets by mouth daily.    Marland Kitchen lisinopril-hydrochlorothiazide  (ZESTORETIC) 20-12.5 MG tablet Take 1 tablet by mouth once daily 90 tablet 0   No facility-administered medications prior to visit.    Allergies  Allergen Reactions  . Amlodipine Other (See Comments)    Disequilibrium, generalized weakness.    ROS As per HPI  PE: Vitals with BMI 07/19/2020 07/18/2020 06/07/2020  Height '5\' 9"'$  '5\' 9"'$  '5\' 9"'$   Weight 159 lbs 13 oz (No Data) 168 lbs 3 oz  BMI 123456 - 0000000  Systolic 99991111 [id number] 0000000  Diastolic 63 70 96  Pulse 71 76 60   Gen: Alert, well appearing.  Patient is oriented to person, place, time, and situation. [id number]: no injection, icteris, swelling, or exudate.  EOMI, PERRLA. Mouth: lips without lesion/swelling.  Oral mucosa pink and moist. Oropharynx without erythema, exudate, or swelling.  CV: RRR, no m/r/g.   LUNGS: CTA bilat, nonlabored resps, good aeration in all lung fields. ABD: soft, NT/ND EXT: no clubbing or  cyanosis.  no edema.  Neuro: CN 2-12 intact bilaterally, strength 5-/5 in proximal and distal upper extremities and lower extremities bilaterally.  No sensory deficits.  No tremor.     Sitting in [place]. Hips ROM mildly limited in all ranges bilat, w/out inducing pain. Mild TTP R thigh and anterior hip region UE strength: 5-/5 bilat                        UE ROM:  Mild diminished flexion/ext/ER/IR shoulders bilat, otherwise ROM in UE's fully intact.  LE strength:  5-/5 bilat                        LE ROM:   Mild diminished ROM hips, knees, ankles bilat.   LABS:  Lab Results  Component Value Date   TSH 1.86 12/13/2019   Lab Results  Component Value Date   WBC 12.3 (H) 08/10/2019   HGB 16.5 08/10/2019   HCT 47.4 08/10/2019   MCV 91.3 08/10/2019   PLT 156 08/10/2019   Lab Results  Component Value Date   CREATININE 2.71 (H) 12/13/2019   BUN 41 (H) 12/13/2019   NA 138 12/13/2019   K 4.2 12/13/2019   CL 100 12/13/2019   CO2 29 12/13/2019   Lab Results  Component Value Date   ALT 9 12/13/2019   AST 15 12/13/2019    ALKPHOS 67 12/13/2019   BILITOT 0.8 12/13/2019   Lab Results  Component Value Date   CHOL 178 07/12/2019   Lab Results  Component Value Date   HDL 41.40 07/12/2019   Lab Results  Component Value Date   LDLCALC 106 (H) 07/12/2019   Lab Results  Component Value Date   TRIG 154.0 (H) 07/12/2019   Lab Results  Component Value Date   CHOLHDL 4 07/12/2019   Lab Results  Component Value Date   PSA 7.0 07/11/2016   Lab Results  Component Value Date   HGBA1C 5.7 (A) 12/13/2019   HGBA1C 5.7 12/13/2019   HGBA1C 5.7 12/13/2019    IMPRESSION AND PLAN:  1) Debilitated pt: generalized weakness, gait instability, recurrent falls. HH referral in process (nursing, PT, OT). Will order bedside commode, shower chair, and motorized WC.  2) HTN: bp seems to shoot up when severely constipated. BP low normal otherwise. In light of this soft bp and cri I'll have him cut his lisin-hctz 20-12.5 tab in half. Cont hydralazine '25mg'$  tid.  3) CRI IV: medical renal dz + obst nephropathy--most recent GFR 13 (sCr 4.0) in ED 4 d/a, but his baseline has been low 20s. Cut back to 1/2 lisin-hct qd.  Bladder now emptying well. BMET today.  4) Acute urinary retention, +acute cystitis. Resolving, emptying bladder fine since foley pulled at urol yesterday. Cont with plan of I/o cath at home prn but call/seek care if consistently having to do this again.  Finish cephalexin. BPH meds no longer an option.  He has exhausted urologic procedure options for his situation.    An After Visit Summary was printed and given to the patient.  FOLLOW UP: Return in about 4 weeks (around 08/16/2020) for routine chronic illness f/u.  Signed:  Crissie Sickles, MD           07/19/2020

## 2020-07-19 ENCOUNTER — Other Ambulatory Visit: Payer: Self-pay

## 2020-07-19 ENCOUNTER — Ambulatory Visit (INDEPENDENT_AMBULATORY_CARE_PROVIDER_SITE_OTHER): Payer: Medicare Other | Admitting: Family Medicine

## 2020-07-19 ENCOUNTER — Encounter: Payer: Self-pay | Admitting: Family Medicine

## 2020-07-19 VITALS — BP 101/63 | HR 71 | Temp 97.8°F | Resp 16 | Ht 69.0 in | Wt 159.8 lb

## 2020-07-19 DIAGNOSIS — R2681 Unsteadiness on feet: Secondary | ICD-10-CM | POA: Diagnosis not present

## 2020-07-19 DIAGNOSIS — N184 Chronic kidney disease, stage 4 (severe): Secondary | ICD-10-CM

## 2020-07-19 DIAGNOSIS — N139 Obstructive and reflux uropathy, unspecified: Secondary | ICD-10-CM

## 2020-07-19 DIAGNOSIS — R338 Other retention of urine: Secondary | ICD-10-CM | POA: Diagnosis not present

## 2020-07-19 DIAGNOSIS — N390 Urinary tract infection, site not specified: Secondary | ICD-10-CM | POA: Diagnosis not present

## 2020-07-19 DIAGNOSIS — R5381 Other malaise: Secondary | ICD-10-CM

## 2020-07-19 DIAGNOSIS — R531 Weakness: Secondary | ICD-10-CM

## 2020-07-19 DIAGNOSIS — E118 Type 2 diabetes mellitus with unspecified complications: Secondary | ICD-10-CM | POA: Diagnosis not present

## 2020-07-19 DIAGNOSIS — N3000 Acute cystitis without hematuria: Secondary | ICD-10-CM | POA: Diagnosis not present

## 2020-07-19 DIAGNOSIS — R296 Repeated falls: Secondary | ICD-10-CM

## 2020-07-19 DIAGNOSIS — I10 Essential (primary) hypertension: Secondary | ICD-10-CM | POA: Diagnosis not present

## 2020-07-19 LAB — BASIC METABOLIC PANEL
BUN: 66 mg/dL — ABNORMAL HIGH (ref 6–23)
CO2: 31 mEq/L (ref 19–32)
Calcium: 9.2 mg/dL (ref 8.4–10.5)
Chloride: 102 mEq/L (ref 96–112)
Creatinine, Ser: 3.03 mg/dL — ABNORMAL HIGH (ref 0.40–1.50)
GFR: 18.04 mL/min — ABNORMAL LOW (ref >60.00)
Glucose, Bld: 158 mg/dL — ABNORMAL HIGH (ref 70–99)
Potassium: 4 mEq/L (ref 3.5–5.1)
Sodium: 142 mEq/L (ref 135–145)

## 2020-07-19 MED ORDER — LISINOPRIL-HYDROCHLOROTHIAZIDE 20-12.5 MG PO TABS
ORAL_TABLET | ORAL | 3 refills | Status: DC
Start: 2020-07-19 — End: 2020-08-30

## 2020-07-19 NOTE — Telephone Encounter (Signed)
Patient was seen in office today for appointment and home health referral entered for nursing and physical therapy assessment needs.

## 2020-07-19 NOTE — Patient Instructions (Signed)
Finish the antibiotic for bladder infection (cephalexin).  Take only HALF of lisinopril-hctz tab once a day. Continue to take a whole '25mg'$  hydralazine tab three times per day.

## 2020-07-19 NOTE — Addendum Note (Signed)
Addended by: Octaviano Glow on: 07/19/2020 02:06 PM   Modules accepted: Orders

## 2020-07-20 ENCOUNTER — Ambulatory Visit: Payer: Medicare Other | Admitting: Family Medicine

## 2020-07-20 ENCOUNTER — Telehealth: Payer: Self-pay

## 2020-07-20 DIAGNOSIS — N139 Obstructive and reflux uropathy, unspecified: Secondary | ICD-10-CM

## 2020-07-20 DIAGNOSIS — N184 Chronic kidney disease, stage 4 (severe): Secondary | ICD-10-CM

## 2020-07-20 NOTE — Telephone Encounter (Signed)
Spoke with pt regarding labs and instructions. Referral placed

## 2020-07-20 NOTE — Telephone Encounter (Signed)
-----   Message from Tammi Sou, MD sent at 07/19/2020  6:12 PM EST ----- Kidney function stable but I recommend he see a nephrologist b/c of how low his kidney function is now, just in case this progresses to the point of possibly needing dialysis in the near or distant future.  If pt agreeable to referral pls order amb ref nephrology, [facility] in [place], dx is chronic renal insufficiency stage 4 and obstructive uropathy.  -thx

## 2020-07-24 ENCOUNTER — Encounter: Payer: Self-pay | Admitting: Family Medicine

## 2020-07-24 DIAGNOSIS — I129 Hypertensive chronic kidney disease with stage 1 through stage 4 chronic kidney disease, or unspecified chronic kidney disease: Secondary | ICD-10-CM | POA: Diagnosis not present

## 2020-07-24 DIAGNOSIS — Z87440 Personal history of urinary (tract) infections: Secondary | ICD-10-CM | POA: Diagnosis not present

## 2020-07-24 DIAGNOSIS — J449 Chronic obstructive pulmonary disease, unspecified: Secondary | ICD-10-CM | POA: Diagnosis not present

## 2020-07-24 DIAGNOSIS — M545 Low back pain, unspecified: Secondary | ICD-10-CM | POA: Diagnosis not present

## 2020-07-24 DIAGNOSIS — K5900 Constipation, unspecified: Secondary | ICD-10-CM | POA: Diagnosis not present

## 2020-07-24 DIAGNOSIS — N184 Chronic kidney disease, stage 4 (severe): Secondary | ICD-10-CM | POA: Diagnosis not present

## 2020-07-24 DIAGNOSIS — G8929 Other chronic pain: Secondary | ICD-10-CM | POA: Diagnosis not present

## 2020-07-24 DIAGNOSIS — Z9181 History of falling: Secondary | ICD-10-CM | POA: Diagnosis not present

## 2020-07-24 DIAGNOSIS — D631 Anemia in chronic kidney disease: Secondary | ICD-10-CM | POA: Diagnosis not present

## 2020-07-24 DIAGNOSIS — E1122 Type 2 diabetes mellitus with diabetic chronic kidney disease: Secondary | ICD-10-CM | POA: Diagnosis not present

## 2020-07-24 DIAGNOSIS — R338 Other retention of urine: Secondary | ICD-10-CM | POA: Diagnosis not present

## 2020-07-24 DIAGNOSIS — N403 Nodular prostate with lower urinary tract symptoms: Secondary | ICD-10-CM | POA: Diagnosis not present

## 2020-07-25 NOTE — Telephone Encounter (Signed)
FYI

## 2020-07-26 ENCOUNTER — Telehealth: Payer: Self-pay

## 2020-07-26 DIAGNOSIS — J449 Chronic obstructive pulmonary disease, unspecified: Secondary | ICD-10-CM | POA: Diagnosis not present

## 2020-07-26 DIAGNOSIS — I129 Hypertensive chronic kidney disease with stage 1 through stage 4 chronic kidney disease, or unspecified chronic kidney disease: Secondary | ICD-10-CM | POA: Diagnosis not present

## 2020-07-26 DIAGNOSIS — N403 Nodular prostate with lower urinary tract symptoms: Secondary | ICD-10-CM | POA: Diagnosis not present

## 2020-07-26 DIAGNOSIS — N184 Chronic kidney disease, stage 4 (severe): Secondary | ICD-10-CM | POA: Diagnosis not present

## 2020-07-26 DIAGNOSIS — E1122 Type 2 diabetes mellitus with diabetic chronic kidney disease: Secondary | ICD-10-CM | POA: Diagnosis not present

## 2020-07-26 DIAGNOSIS — D631 Anemia in chronic kidney disease: Secondary | ICD-10-CM | POA: Diagnosis not present

## 2020-07-26 NOTE — Telephone Encounter (Signed)
Gary Morgan calling from [facility] requesting start of skilled nursing visits.  Her plan of care is as requested below  X 4 days a week for x 2 weeks  X 2 days a week for 1 week   Gary Morgan can be reached at [phone].  You may leave a message as her voicemail is private and secure. She is aware request will not be addressed until tomorrow 1/28 because time of call is currently 4:53PM on 1/27.  Thank you.

## 2020-07-27 ENCOUNTER — Telehealth: Payer: Self-pay

## 2020-07-27 DIAGNOSIS — N184 Chronic kidney disease, stage 4 (severe): Secondary | ICD-10-CM | POA: Diagnosis not present

## 2020-07-27 DIAGNOSIS — I129 Hypertensive chronic kidney disease with stage 1 through stage 4 chronic kidney disease, or unspecified chronic kidney disease: Secondary | ICD-10-CM | POA: Diagnosis not present

## 2020-07-27 DIAGNOSIS — D631 Anemia in chronic kidney disease: Secondary | ICD-10-CM | POA: Diagnosis not present

## 2020-07-27 DIAGNOSIS — J449 Chronic obstructive pulmonary disease, unspecified: Secondary | ICD-10-CM | POA: Diagnosis not present

## 2020-07-27 DIAGNOSIS — E1122 Type 2 diabetes mellitus with diabetic chronic kidney disease: Secondary | ICD-10-CM | POA: Diagnosis not present

## 2020-07-27 DIAGNOSIS — N403 Nodular prostate with lower urinary tract symptoms: Secondary | ICD-10-CM | POA: Diagnosis not present

## 2020-07-27 NOTE — Telephone Encounter (Signed)
FYI  Please see below

## 2020-07-27 NOTE — Telephone Encounter (Signed)
Okay for orders? 

## 2020-07-27 NOTE — Telephone Encounter (Signed)
Lottie Notified

## 2020-07-27 NOTE — Telephone Encounter (Signed)
Gary Morgan notified of approval.

## 2020-07-27 NOTE — Telephone Encounter (Signed)
Yes good

## 2020-07-27 NOTE — Telephone Encounter (Signed)
I put in referral for [facility], which is in [place]. However, it looks like he has been set up to go to [facility], which is a different practice-->?why?.   Was this a mistake? I don't know of any nephrology offices in [place]. [facility] is the closest locally that's why I refer to it. Help clarify?!?

## 2020-07-27 NOTE — Telephone Encounter (Signed)
Gary Morgan calling from [facility] requesting start of physical therapy at home.  Her plan of care is as requested below   X 2 days a week for x 6 weeks   X 1 days a week for 2 weeks   Gary Morgan can be reached at [phone]. You may leave a message as her voicemail is private and secure.

## 2020-07-29 ENCOUNTER — Other Ambulatory Visit: Payer: Self-pay | Admitting: Family Medicine

## 2020-07-30 DIAGNOSIS — E1122 Type 2 diabetes mellitus with diabetic chronic kidney disease: Secondary | ICD-10-CM | POA: Diagnosis not present

## 2020-07-30 DIAGNOSIS — I129 Hypertensive chronic kidney disease with stage 1 through stage 4 chronic kidney disease, or unspecified chronic kidney disease: Secondary | ICD-10-CM | POA: Diagnosis not present

## 2020-07-30 DIAGNOSIS — D631 Anemia in chronic kidney disease: Secondary | ICD-10-CM | POA: Diagnosis not present

## 2020-07-30 DIAGNOSIS — N184 Chronic kidney disease, stage 4 (severe): Secondary | ICD-10-CM | POA: Diagnosis not present

## 2020-07-30 DIAGNOSIS — J449 Chronic obstructive pulmonary disease, unspecified: Secondary | ICD-10-CM | POA: Diagnosis not present

## 2020-07-30 DIAGNOSIS — N403 Nodular prostate with lower urinary tract symptoms: Secondary | ICD-10-CM | POA: Diagnosis not present

## 2020-07-31 ENCOUNTER — Telehealth: Payer: Self-pay | Admitting: Family Medicine

## 2020-07-31 ENCOUNTER — Telehealth: Payer: Self-pay

## 2020-07-31 DIAGNOSIS — J449 Chronic obstructive pulmonary disease, unspecified: Secondary | ICD-10-CM | POA: Diagnosis not present

## 2020-07-31 DIAGNOSIS — N184 Chronic kidney disease, stage 4 (severe): Secondary | ICD-10-CM | POA: Diagnosis not present

## 2020-07-31 DIAGNOSIS — D631 Anemia in chronic kidney disease: Secondary | ICD-10-CM | POA: Diagnosis not present

## 2020-07-31 DIAGNOSIS — N403 Nodular prostate with lower urinary tract symptoms: Secondary | ICD-10-CM | POA: Diagnosis not present

## 2020-07-31 DIAGNOSIS — E1122 Type 2 diabetes mellitus with diabetic chronic kidney disease: Secondary | ICD-10-CM | POA: Diagnosis not present

## 2020-07-31 DIAGNOSIS — I129 Hypertensive chronic kidney disease with stage 1 through stage 4 chronic kidney disease, or unspecified chronic kidney disease: Secondary | ICD-10-CM | POA: Diagnosis not present

## 2020-07-31 NOTE — Telephone Encounter (Signed)
Genevieve from [facility] called to get verbal order for pt to have OT eval. Please advise if approved.

## 2020-07-31 NOTE — Telephone Encounter (Signed)
Received faxed orders from [facility]. Placed in Dr. Idelle Leech inbox in front office to be signed.

## 2020-08-01 NOTE — Telephone Encounter (Signed)
Signed and put in box to go up front. Signed:  Crissie Sickles, MD           08/01/2020

## 2020-08-01 NOTE — Telephone Encounter (Signed)
SN for frequency 2wk3, 1wk6. Placed on PCP desk to review and sign, if appropriate.

## 2020-08-02 ENCOUNTER — Other Ambulatory Visit: Payer: Self-pay

## 2020-08-02 ENCOUNTER — Encounter: Payer: Self-pay | Admitting: Family Medicine

## 2020-08-02 DIAGNOSIS — N403 Nodular prostate with lower urinary tract symptoms: Secondary | ICD-10-CM | POA: Diagnosis not present

## 2020-08-02 DIAGNOSIS — E1122 Type 2 diabetes mellitus with diabetic chronic kidney disease: Secondary | ICD-10-CM | POA: Diagnosis not present

## 2020-08-02 DIAGNOSIS — D631 Anemia in chronic kidney disease: Secondary | ICD-10-CM | POA: Diagnosis not present

## 2020-08-02 DIAGNOSIS — J449 Chronic obstructive pulmonary disease, unspecified: Secondary | ICD-10-CM | POA: Diagnosis not present

## 2020-08-02 DIAGNOSIS — N184 Chronic kidney disease, stage 4 (severe): Secondary | ICD-10-CM | POA: Diagnosis not present

## 2020-08-02 DIAGNOSIS — I129 Hypertensive chronic kidney disease with stage 1 through stage 4 chronic kidney disease, or unspecified chronic kidney disease: Secondary | ICD-10-CM | POA: Diagnosis not present

## 2020-08-02 MED ORDER — HYDRALAZINE HCL 25 MG PO TABS: 25.0000 mg | ORAL_TABLET | Freq: Three times a day (TID) | ORAL | 1 refills | Status: DC

## 2020-08-06 DIAGNOSIS — N403 Nodular prostate with lower urinary tract symptoms: Secondary | ICD-10-CM | POA: Diagnosis not present

## 2020-08-06 DIAGNOSIS — I129 Hypertensive chronic kidney disease with stage 1 through stage 4 chronic kidney disease, or unspecified chronic kidney disease: Secondary | ICD-10-CM | POA: Diagnosis not present

## 2020-08-06 DIAGNOSIS — N184 Chronic kidney disease, stage 4 (severe): Secondary | ICD-10-CM | POA: Diagnosis not present

## 2020-08-06 DIAGNOSIS — J449 Chronic obstructive pulmonary disease, unspecified: Secondary | ICD-10-CM | POA: Diagnosis not present

## 2020-08-06 DIAGNOSIS — E1122 Type 2 diabetes mellitus with diabetic chronic kidney disease: Secondary | ICD-10-CM | POA: Diagnosis not present

## 2020-08-06 DIAGNOSIS — D631 Anemia in chronic kidney disease: Secondary | ICD-10-CM | POA: Diagnosis not present

## 2020-08-07 ENCOUNTER — Telehealth: Payer: Self-pay

## 2020-08-07 ENCOUNTER — Encounter: Payer: Self-pay | Admitting: Family Medicine

## 2020-08-07 DIAGNOSIS — I129 Hypertensive chronic kidney disease with stage 1 through stage 4 chronic kidney disease, or unspecified chronic kidney disease: Secondary | ICD-10-CM | POA: Diagnosis not present

## 2020-08-07 DIAGNOSIS — J449 Chronic obstructive pulmonary disease, unspecified: Secondary | ICD-10-CM | POA: Diagnosis not present

## 2020-08-07 DIAGNOSIS — N184 Chronic kidney disease, stage 4 (severe): Secondary | ICD-10-CM | POA: Diagnosis not present

## 2020-08-07 DIAGNOSIS — E1122 Type 2 diabetes mellitus with diabetic chronic kidney disease: Secondary | ICD-10-CM | POA: Diagnosis not present

## 2020-08-07 DIAGNOSIS — N403 Nodular prostate with lower urinary tract symptoms: Secondary | ICD-10-CM | POA: Diagnosis not present

## 2020-08-07 DIAGNOSIS — D631 Anemia in chronic kidney disease: Secondary | ICD-10-CM | POA: Diagnosis not present

## 2020-08-07 NOTE — Telephone Encounter (Signed)
Lottie calling from [facility] requesting Occupational Therapy evaluation.  Patient would like to start taking showers on his own.  Lottie can be reached at [phone].  You may leave a message as her voicemail is private and secure.  Thank you

## 2020-08-07 NOTE — Telephone Encounter (Signed)
Yes OK

## 2020-08-07 NOTE — Telephone Encounter (Signed)
Okay for orders? 

## 2020-08-08 DIAGNOSIS — D631 Anemia in chronic kidney disease: Secondary | ICD-10-CM | POA: Diagnosis not present

## 2020-08-08 DIAGNOSIS — I129 Hypertensive chronic kidney disease with stage 1 through stage 4 chronic kidney disease, or unspecified chronic kidney disease: Secondary | ICD-10-CM | POA: Diagnosis not present

## 2020-08-08 DIAGNOSIS — N403 Nodular prostate with lower urinary tract symptoms: Secondary | ICD-10-CM | POA: Diagnosis not present

## 2020-08-08 DIAGNOSIS — E1122 Type 2 diabetes mellitus with diabetic chronic kidney disease: Secondary | ICD-10-CM | POA: Diagnosis not present

## 2020-08-08 DIAGNOSIS — J449 Chronic obstructive pulmonary disease, unspecified: Secondary | ICD-10-CM | POA: Diagnosis not present

## 2020-08-08 DIAGNOSIS — N184 Chronic kidney disease, stage 4 (severe): Secondary | ICD-10-CM | POA: Diagnosis not present

## 2020-08-09 DIAGNOSIS — E1122 Type 2 diabetes mellitus with diabetic chronic kidney disease: Secondary | ICD-10-CM | POA: Diagnosis not present

## 2020-08-09 DIAGNOSIS — J449 Chronic obstructive pulmonary disease, unspecified: Secondary | ICD-10-CM | POA: Diagnosis not present

## 2020-08-09 DIAGNOSIS — N184 Chronic kidney disease, stage 4 (severe): Secondary | ICD-10-CM | POA: Diagnosis not present

## 2020-08-09 DIAGNOSIS — I129 Hypertensive chronic kidney disease with stage 1 through stage 4 chronic kidney disease, or unspecified chronic kidney disease: Secondary | ICD-10-CM | POA: Diagnosis not present

## 2020-08-09 DIAGNOSIS — N403 Nodular prostate with lower urinary tract symptoms: Secondary | ICD-10-CM | POA: Diagnosis not present

## 2020-08-09 DIAGNOSIS — D631 Anemia in chronic kidney disease: Secondary | ICD-10-CM | POA: Diagnosis not present

## 2020-08-14 DIAGNOSIS — J449 Chronic obstructive pulmonary disease, unspecified: Secondary | ICD-10-CM | POA: Diagnosis not present

## 2020-08-14 DIAGNOSIS — R339 Retention of urine, unspecified: Secondary | ICD-10-CM | POA: Diagnosis not present

## 2020-08-14 DIAGNOSIS — I129 Hypertensive chronic kidney disease with stage 1 through stage 4 chronic kidney disease, or unspecified chronic kidney disease: Secondary | ICD-10-CM | POA: Diagnosis not present

## 2020-08-14 DIAGNOSIS — N179 Acute kidney failure, unspecified: Secondary | ICD-10-CM | POA: Diagnosis not present

## 2020-08-14 DIAGNOSIS — N184 Chronic kidney disease, stage 4 (severe): Secondary | ICD-10-CM | POA: Diagnosis not present

## 2020-08-14 DIAGNOSIS — N403 Nodular prostate with lower urinary tract symptoms: Secondary | ICD-10-CM | POA: Diagnosis not present

## 2020-08-14 DIAGNOSIS — E1122 Type 2 diabetes mellitus with diabetic chronic kidney disease: Secondary | ICD-10-CM | POA: Diagnosis not present

## 2020-08-14 DIAGNOSIS — R82998 Other abnormal findings in urine: Secondary | ICD-10-CM | POA: Diagnosis not present

## 2020-08-14 DIAGNOSIS — D631 Anemia in chronic kidney disease: Secondary | ICD-10-CM | POA: Diagnosis not present

## 2020-08-14 DIAGNOSIS — N281 Cyst of kidney, acquired: Secondary | ICD-10-CM | POA: Diagnosis not present

## 2020-08-14 DIAGNOSIS — R300 Dysuria: Secondary | ICD-10-CM | POA: Diagnosis not present

## 2020-08-14 LAB — BASIC METABOLIC PANEL
BUN: 30 — AB (ref 4–21)
BUN: 30 — AB (ref 4–21)
CO2: 29 — AB (ref 13–22)
CO2: 29 — AB (ref 13–22)
Chloride: 101 (ref 99–108)
Chloride: 101 (ref 99–108)
Creatinine: 2.4 — AB (ref 0.6–1.3)
Glucose: 106
Glucose: 106
Potassium: 5.2 (ref 3.4–5.3)
Potassium: 5.2 (ref 3.4–5.3)
Sodium: 138 (ref 137–147)
Sodium: 138 (ref 137–147)

## 2020-08-14 LAB — COMPREHENSIVE METABOLIC PANEL
Albumin: 4 (ref 3.5–5.0)
Albumin: 4 (ref 3.5–5.0)
Calcium: 9.9 (ref 8.7–10.7)
Calcium: 9.9 (ref 8.7–10.7)
GFR calc Af Amer: 27
GFR calc Af Amer: 27
GFR calc non Af Amer: 23
GFR calc non Af Amer: 23

## 2020-08-14 LAB — CBC AND DIFFERENTIAL: Hemoglobin: 14 (ref 13.5–17.5)

## 2020-08-14 NOTE — Telephone Encounter (Signed)
Sent as FYI. 

## 2020-08-15 DIAGNOSIS — I129 Hypertensive chronic kidney disease with stage 1 through stage 4 chronic kidney disease, or unspecified chronic kidney disease: Secondary | ICD-10-CM | POA: Diagnosis not present

## 2020-08-15 DIAGNOSIS — J449 Chronic obstructive pulmonary disease, unspecified: Secondary | ICD-10-CM | POA: Diagnosis not present

## 2020-08-15 DIAGNOSIS — N184 Chronic kidney disease, stage 4 (severe): Secondary | ICD-10-CM | POA: Diagnosis not present

## 2020-08-15 DIAGNOSIS — D631 Anemia in chronic kidney disease: Secondary | ICD-10-CM | POA: Diagnosis not present

## 2020-08-15 DIAGNOSIS — N403 Nodular prostate with lower urinary tract symptoms: Secondary | ICD-10-CM | POA: Diagnosis not present

## 2020-08-15 DIAGNOSIS — E1122 Type 2 diabetes mellitus with diabetic chronic kidney disease: Secondary | ICD-10-CM | POA: Diagnosis not present

## 2020-08-15 NOTE — Telephone Encounter (Signed)
Unfortunately all the medicines for burning with urination can affect the kidney function.  Would recommend tylenol for any burning, and agree with UA to make sure no infection if burning returns  Nickolas Madrid, MD 08/15/2020

## 2020-08-16 ENCOUNTER — Ambulatory Visit: Payer: Medicare Other | Admitting: Family Medicine

## 2020-08-16 DIAGNOSIS — E1122 Type 2 diabetes mellitus with diabetic chronic kidney disease: Secondary | ICD-10-CM | POA: Diagnosis not present

## 2020-08-16 DIAGNOSIS — N184 Chronic kidney disease, stage 4 (severe): Secondary | ICD-10-CM | POA: Diagnosis not present

## 2020-08-16 DIAGNOSIS — D631 Anemia in chronic kidney disease: Secondary | ICD-10-CM | POA: Diagnosis not present

## 2020-08-16 DIAGNOSIS — I129 Hypertensive chronic kidney disease with stage 1 through stage 4 chronic kidney disease, or unspecified chronic kidney disease: Secondary | ICD-10-CM | POA: Diagnosis not present

## 2020-08-16 DIAGNOSIS — N403 Nodular prostate with lower urinary tract symptoms: Secondary | ICD-10-CM | POA: Diagnosis not present

## 2020-08-16 DIAGNOSIS — J449 Chronic obstructive pulmonary disease, unspecified: Secondary | ICD-10-CM | POA: Diagnosis not present

## 2020-08-20 ENCOUNTER — Telehealth: Payer: Self-pay | Admitting: Family Medicine

## 2020-08-20 NOTE — Telephone Encounter (Signed)
Received faxed orders from [facility] for "Change in meds, added new meds." Placed in Dr. Idelle Leech inbox in front office to be signed.

## 2020-08-20 NOTE — Telephone Encounter (Signed)
Signed and put in box to go up front. Signed:  Crissie Sickles, MD           08/20/2020

## 2020-08-20 NOTE — Telephone Encounter (Signed)
Placed on PCP desk to review and sign, if appropriate.  

## 2020-08-21 DIAGNOSIS — I129 Hypertensive chronic kidney disease with stage 1 through stage 4 chronic kidney disease, or unspecified chronic kidney disease: Secondary | ICD-10-CM | POA: Diagnosis not present

## 2020-08-21 DIAGNOSIS — D631 Anemia in chronic kidney disease: Secondary | ICD-10-CM | POA: Diagnosis not present

## 2020-08-21 DIAGNOSIS — N184 Chronic kidney disease, stage 4 (severe): Secondary | ICD-10-CM | POA: Diagnosis not present

## 2020-08-21 DIAGNOSIS — N403 Nodular prostate with lower urinary tract symptoms: Secondary | ICD-10-CM | POA: Diagnosis not present

## 2020-08-21 DIAGNOSIS — E1122 Type 2 diabetes mellitus with diabetic chronic kidney disease: Secondary | ICD-10-CM | POA: Diagnosis not present

## 2020-08-21 DIAGNOSIS — J449 Chronic obstructive pulmonary disease, unspecified: Secondary | ICD-10-CM | POA: Diagnosis not present

## 2020-08-23 DIAGNOSIS — E1122 Type 2 diabetes mellitus with diabetic chronic kidney disease: Secondary | ICD-10-CM | POA: Diagnosis not present

## 2020-08-23 DIAGNOSIS — M545 Low back pain, unspecified: Secondary | ICD-10-CM | POA: Diagnosis not present

## 2020-08-23 DIAGNOSIS — Z87440 Personal history of urinary (tract) infections: Secondary | ICD-10-CM | POA: Diagnosis not present

## 2020-08-23 DIAGNOSIS — G8929 Other chronic pain: Secondary | ICD-10-CM | POA: Diagnosis not present

## 2020-08-23 DIAGNOSIS — J449 Chronic obstructive pulmonary disease, unspecified: Secondary | ICD-10-CM | POA: Diagnosis not present

## 2020-08-23 DIAGNOSIS — R338 Other retention of urine: Secondary | ICD-10-CM | POA: Diagnosis not present

## 2020-08-23 DIAGNOSIS — N184 Chronic kidney disease, stage 4 (severe): Secondary | ICD-10-CM | POA: Diagnosis not present

## 2020-08-23 DIAGNOSIS — D631 Anemia in chronic kidney disease: Secondary | ICD-10-CM | POA: Diagnosis not present

## 2020-08-23 DIAGNOSIS — Z9181 History of falling: Secondary | ICD-10-CM | POA: Diagnosis not present

## 2020-08-23 DIAGNOSIS — I129 Hypertensive chronic kidney disease with stage 1 through stage 4 chronic kidney disease, or unspecified chronic kidney disease: Secondary | ICD-10-CM | POA: Diagnosis not present

## 2020-08-23 DIAGNOSIS — K5900 Constipation, unspecified: Secondary | ICD-10-CM | POA: Diagnosis not present

## 2020-08-23 DIAGNOSIS — N403 Nodular prostate with lower urinary tract symptoms: Secondary | ICD-10-CM | POA: Diagnosis not present

## 2020-08-28 DIAGNOSIS — N403 Nodular prostate with lower urinary tract symptoms: Secondary | ICD-10-CM | POA: Diagnosis not present

## 2020-08-28 DIAGNOSIS — I129 Hypertensive chronic kidney disease with stage 1 through stage 4 chronic kidney disease, or unspecified chronic kidney disease: Secondary | ICD-10-CM | POA: Diagnosis not present

## 2020-08-28 DIAGNOSIS — N184 Chronic kidney disease, stage 4 (severe): Secondary | ICD-10-CM | POA: Diagnosis not present

## 2020-08-28 DIAGNOSIS — E1122 Type 2 diabetes mellitus with diabetic chronic kidney disease: Secondary | ICD-10-CM | POA: Diagnosis not present

## 2020-08-28 DIAGNOSIS — D631 Anemia in chronic kidney disease: Secondary | ICD-10-CM | POA: Diagnosis not present

## 2020-08-28 DIAGNOSIS — J449 Chronic obstructive pulmonary disease, unspecified: Secondary | ICD-10-CM | POA: Diagnosis not present

## 2020-08-30 ENCOUNTER — Other Ambulatory Visit: Payer: Self-pay

## 2020-08-30 ENCOUNTER — Other Ambulatory Visit: Payer: Self-pay | Admitting: Family Medicine

## 2020-08-30 DIAGNOSIS — D631 Anemia in chronic kidney disease: Secondary | ICD-10-CM | POA: Diagnosis not present

## 2020-08-30 DIAGNOSIS — N403 Nodular prostate with lower urinary tract symptoms: Secondary | ICD-10-CM | POA: Diagnosis not present

## 2020-08-30 DIAGNOSIS — N184 Chronic kidney disease, stage 4 (severe): Secondary | ICD-10-CM | POA: Diagnosis not present

## 2020-08-30 DIAGNOSIS — J449 Chronic obstructive pulmonary disease, unspecified: Secondary | ICD-10-CM | POA: Diagnosis not present

## 2020-08-30 DIAGNOSIS — I129 Hypertensive chronic kidney disease with stage 1 through stage 4 chronic kidney disease, or unspecified chronic kidney disease: Secondary | ICD-10-CM | POA: Diagnosis not present

## 2020-08-30 DIAGNOSIS — E1122 Type 2 diabetes mellitus with diabetic chronic kidney disease: Secondary | ICD-10-CM | POA: Diagnosis not present

## 2020-08-30 MED ORDER — HYDRALAZINE HCL 50 MG PO TABS: ORAL_TABLET | ORAL | 5 refills | Status: DC

## 2020-08-30 NOTE — Telephone Encounter (Signed)
Spoke with pt's wife, Geraldine(DPR) and advised of med instructions/recommendations. Rx sent to pharmacy with note "fill upon pt request", explained to her this means she can call the pharmacy once the current rx runs out and they should be able to get new rx filled. Voiced understanding. Advised to continue monitoring bp

## 2020-08-30 NOTE — Telephone Encounter (Signed)
Increase hydralazine to 1 and 1/2 of the '50mg'$  tabs tid.  Continue metoprolol '50mg'$  daily.  If new rx needed then pls do hydralazine '50mg'$ , 1 and 1/2 tabs po tid, #135, RF x 5.

## 2020-09-04 DIAGNOSIS — N403 Nodular prostate with lower urinary tract symptoms: Secondary | ICD-10-CM | POA: Diagnosis not present

## 2020-09-04 DIAGNOSIS — I129 Hypertensive chronic kidney disease with stage 1 through stage 4 chronic kidney disease, or unspecified chronic kidney disease: Secondary | ICD-10-CM | POA: Diagnosis not present

## 2020-09-04 DIAGNOSIS — N184 Chronic kidney disease, stage 4 (severe): Secondary | ICD-10-CM | POA: Diagnosis not present

## 2020-09-04 DIAGNOSIS — E1122 Type 2 diabetes mellitus with diabetic chronic kidney disease: Secondary | ICD-10-CM | POA: Diagnosis not present

## 2020-09-04 DIAGNOSIS — J449 Chronic obstructive pulmonary disease, unspecified: Secondary | ICD-10-CM | POA: Diagnosis not present

## 2020-09-04 DIAGNOSIS — D631 Anemia in chronic kidney disease: Secondary | ICD-10-CM | POA: Diagnosis not present

## 2020-09-06 DIAGNOSIS — N403 Nodular prostate with lower urinary tract symptoms: Secondary | ICD-10-CM | POA: Diagnosis not present

## 2020-09-06 DIAGNOSIS — J449 Chronic obstructive pulmonary disease, unspecified: Secondary | ICD-10-CM | POA: Diagnosis not present

## 2020-09-06 DIAGNOSIS — D631 Anemia in chronic kidney disease: Secondary | ICD-10-CM | POA: Diagnosis not present

## 2020-09-06 DIAGNOSIS — I129 Hypertensive chronic kidney disease with stage 1 through stage 4 chronic kidney disease, or unspecified chronic kidney disease: Secondary | ICD-10-CM | POA: Diagnosis not present

## 2020-09-06 DIAGNOSIS — N184 Chronic kidney disease, stage 4 (severe): Secondary | ICD-10-CM | POA: Diagnosis not present

## 2020-09-06 DIAGNOSIS — E1122 Type 2 diabetes mellitus with diabetic chronic kidney disease: Secondary | ICD-10-CM | POA: Diagnosis not present

## 2020-09-11 NOTE — Telephone Encounter (Signed)
Please advise if medication dosage change is appropriate to send to pharmacy.

## 2020-09-11 NOTE — Telephone Encounter (Signed)
There is no '75mg'$  hydralazine. It would be best to do new rx for '25mg'$  hydralazine tabs with instructions of "3 tabs po tid", #270, RF x 6-thx

## 2020-09-12 ENCOUNTER — Other Ambulatory Visit: Payer: Self-pay

## 2020-09-12 MED ORDER — HYDRALAZINE HCL 25 MG PO TABS: ORAL_TABLET | ORAL | 6 refills | Status: DC

## 2020-09-12 NOTE — Telephone Encounter (Signed)
Spoke with patient's wife and advised of med recommendations. Rx sent for '25mg'$  with sig instructions, #270 and 6 refills.

## 2020-09-20 ENCOUNTER — Telehealth: Payer: Self-pay | Admitting: Family Medicine

## 2020-09-20 ENCOUNTER — Encounter: Payer: Self-pay | Admitting: Family Medicine

## 2020-09-20 DIAGNOSIS — N184 Chronic kidney disease, stage 4 (severe): Secondary | ICD-10-CM | POA: Diagnosis not present

## 2020-09-20 DIAGNOSIS — I129 Hypertensive chronic kidney disease with stage 1 through stage 4 chronic kidney disease, or unspecified chronic kidney disease: Secondary | ICD-10-CM | POA: Diagnosis not present

## 2020-09-20 DIAGNOSIS — E1122 Type 2 diabetes mellitus with diabetic chronic kidney disease: Secondary | ICD-10-CM | POA: Diagnosis not present

## 2020-09-20 DIAGNOSIS — D631 Anemia in chronic kidney disease: Secondary | ICD-10-CM | POA: Diagnosis not present

## 2020-09-20 DIAGNOSIS — J449 Chronic obstructive pulmonary disease, unspecified: Secondary | ICD-10-CM | POA: Diagnosis not present

## 2020-09-20 DIAGNOSIS — N403 Nodular prostate with lower urinary tract symptoms: Secondary | ICD-10-CM | POA: Diagnosis not present

## 2020-09-20 NOTE — Telephone Encounter (Signed)
Yes, okay.

## 2020-09-20 NOTE — Telephone Encounter (Signed)
Notified of approved VO

## 2020-09-20 NOTE — Telephone Encounter (Signed)
Gary Morgan from [facility] needs verbal order for 1 time a week ( This week) and discharge next week.   ( replacing last week appt with this week) Okay to leave message

## 2020-09-20 NOTE — Telephone Encounter (Signed)
Please advise if requested VO is okay

## 2020-09-21 NOTE — Telephone Encounter (Signed)
Still needs to get wheelchair!

## 2020-09-21 NOTE — Telephone Encounter (Signed)
Please advise, thanks.

## 2020-09-24 ENCOUNTER — Other Ambulatory Visit: Payer: Self-pay

## 2020-09-24 ENCOUNTER — Telehealth: Payer: Self-pay | Admitting: Family Medicine

## 2020-09-24 DIAGNOSIS — R5381 Other malaise: Secondary | ICD-10-CM

## 2020-09-24 DIAGNOSIS — R296 Repeated falls: Secondary | ICD-10-CM

## 2020-09-24 DIAGNOSIS — R531 Weakness: Secondary | ICD-10-CM

## 2020-09-24 DIAGNOSIS — R2681 Unsteadiness on feet: Secondary | ICD-10-CM

## 2020-09-24 NOTE — Telephone Encounter (Signed)
Sean with [facility] called to inform Dr. Anitra Lauth that they had discharged the patient from [facility] physical therapy on 09/20/20.

## 2020-09-24 NOTE — Telephone Encounter (Signed)
FYI  Please see below

## 2020-09-27 ENCOUNTER — Telehealth: Payer: Self-pay

## 2020-09-27 ENCOUNTER — Other Ambulatory Visit: Payer: Self-pay

## 2020-09-27 ENCOUNTER — Encounter: Payer: Self-pay | Admitting: Family Medicine

## 2020-09-27 ENCOUNTER — Ambulatory Visit (INDEPENDENT_AMBULATORY_CARE_PROVIDER_SITE_OTHER): Payer: Medicare Other | Admitting: Family Medicine

## 2020-09-27 VITALS — BP 178/75 | HR 57 | Temp 97.8°F | Resp 16 | Ht 69.0 in | Wt 165.6 lb

## 2020-09-27 DIAGNOSIS — N184 Chronic kidney disease, stage 4 (severe): Secondary | ICD-10-CM | POA: Diagnosis not present

## 2020-09-27 DIAGNOSIS — R5381 Other malaise: Secondary | ICD-10-CM

## 2020-09-27 DIAGNOSIS — I10 Essential (primary) hypertension: Secondary | ICD-10-CM

## 2020-09-27 DIAGNOSIS — E1121 Type 2 diabetes mellitus with diabetic nephropathy: Secondary | ICD-10-CM

## 2020-09-27 DIAGNOSIS — R2681 Unsteadiness on feet: Secondary | ICD-10-CM

## 2020-09-27 LAB — BASIC METABOLIC PANEL
BUN: 35 mg/dL — ABNORMAL HIGH (ref 6–23)
CO2: 28 mEq/L (ref 19–32)
Calcium: 9.3 mg/dL (ref 8.4–10.5)
Chloride: 104 mEq/L (ref 96–112)
Creatinine, Ser: 2.58 mg/dL — ABNORMAL HIGH (ref 0.40–1.50)
GFR: 21.85 mL/min — ABNORMAL LOW (ref >60.00)
Glucose, Bld: 98 mg/dL (ref 70–99)
Potassium: 4 mEq/L (ref 3.5–5.1)
Sodium: 142 mEq/L (ref 135–145)

## 2020-09-27 LAB — HEMOGLOBIN A1C: Hgb A1c MFr Bld: 5.5 % (ref 4.6–6.5)

## 2020-09-27 MED ORDER — HYDRALAZINE HCL 100 MG PO TABS: 100.0000 mg | ORAL_TABLET | Freq: Three times a day (TID) | ORAL | 2 refills | Status: DC

## 2020-09-27 MED ORDER — HYDRALAZINE HCL 100 MG PO TABS: 100.0000 mg | ORAL_TABLET | Freq: Two times a day (BID) | ORAL | 2 refills | Status: DC

## 2020-09-27 NOTE — Telephone Encounter (Signed)
FYI. Please see below.   Mentioned in today's note: "2) HTN: not ideal control. Increase hydralazine to 100 mg tid and continue toprol xl '50mg'$  qd". Advised pt's wife, Geraldine(DPR) to increase to '100mg'$  tid as instructed. We will update med list to reflect changes.

## 2020-09-27 NOTE — Telephone Encounter (Signed)
Patient wife (DPR) calling following appt today with Dr.McGowen. Wife states that medication was increased today but AVS does not reflect the change.  hydrALAZINE (APRESOLINE) 25 MG tablet [id number]    Please call today.

## 2020-09-27 NOTE — Telephone Encounter (Signed)
OK, I updated his med list. Gary Morgan the hydralazine '25mg'$  tabs by taking 4 tabs three times per day. I sent in rx for hydralazine '100mg'$  tabs for him to pick up when he runs out of the '25mg'$  tabs.

## 2020-09-27 NOTE — Progress Notes (Addendum)
OFFICE VISIT  09/27/2020  CC:  Chief Complaint  Patient presents with   Follow-up    RCI, 2 month.    HPI:    Patient is a 87 y.o. Caucasian male who presents accompanied by wife [name] for face-to-face mobility eval and for 2 mo f/u generalized weakness and unsteady gait, CRI IV, HTN, DM. A/P as of last visit: "1) Debilitated pt: generalized weakness, gait instability, recurrent falls. HH referral in process (nursing, PT, OT). Will order bedside commode, shower chair, and motorized WC.   2) HTN: bp seems to shoot up when severely constipated. BP low normal otherwise. In light of this soft bp and cri I'll have him cut his lisin-hctz 20-12.5 tab in half. Cont hydralazine '25mg'$  tid.   3) CRI IV: medical renal dz + obst nephropathy--most recent GFR 13 (sCr 4.0) in ED 4 d/a, but his baseline has been low 20s. Cut back to 1/2 lisin-hct qd.  Bladder now emptying well. BMET today.   4) Acute urinary retention, +acute cystitis. Resolving, emptying bladder fine since foley pulled at urol yesterday. Cont with plan of I/o cath at home prn but call/seek care if consistently having to do this again.  Finish cephalexin. BPH meds no longer an option.  He has exhausted urologic procedure options for his situation.  "  INTERIM HX: Stable. Does minimal walking with rolling walker, using shower chair and says it is great.  Pt now established with Dr. Royce Macadamia at [facility]. 08/14/20 sCr was 2.4 and his lisin-hctz was d/c'd and hydralazine was increased to '50mg'$  tid. We've since increased this to '75mg'$  tid and he also takes toprol xl '50mg'$  qd. BP's still 150s syst and diast 70s avg, hr 60s.  He is emptying his bladder well, no straining and no pain. Empties q2h during daytime, drinking fluids well.  Today's visit is to conduct a mobility evaluation.   Pt with generalized weakness chronically, unstable gait, hx of recurrent falls.  1) This patient has the following mobility-related  ADL(s) that will be significantly improved in the home:  Bathing, toileting, moving room to room safely, meal prep.  2) A cane or walker will not allow for improvement in mobility-related ADL's in home because:  Pt too weak and unstable on his feet.  3) A manual wheelchair will not allow for improvement in mobility-related ADL's in home because:  Pt lacks adequate arms/shoulders/upper body strength to self propel.    (see objective measurements below)  4) A scooter will not allow for improvement in mobility-related ADL's in home because:  Generalized weakness and poor postural stability/balance.  5) In my opinion, this patient can physically and mentally operate the power wheelchair safely.  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 Dois Davenport (urol).  Dr. Jeffie Pollock suspects this was due to stretch injury and has resolved as of 09/24/16.  Arthritis of right wrist    s/p fracture of middle third of scaphoid bone sustained cleaning up from [facility] injection by Dr. Napoleon Form in [place] (ortho) made the pain go away.   BPH with obstruction/lower urinary tract symptoms    hx of urinary retention (s/p foley cath when in hosp for urosepsis 04/2016).  +Areflexic bladder.  TURP will not help since he has acontractile bladder.  Options are CIC, indwelling urethral foley, or suprapubic catheter, HOLEP or simple prostatectomy.  HOLEP done 10/2019.   Chronic low back pain    Chronic renal insufficiency, stage 4 (severe) (HCC)    CrCl 30s (saw nephrologist in [place]). +microalbuminuria 04/2014 at PCP in [place].  Stage IV 2018: GFR upper 20s.  Obstruc urop contrib, bilat hydroureteroneph->HoLEP 11/2019. Recur urin retent, GFR dec to about 20->recommended nephrol ref 06/2020.   COPD (chronic obstructive pulmonary disease) ([place])    on CXR 09/27/2015   Diabetes mellitus ([place])    2015 HbA1c 6.2-6.3 %.  HbA1c 06/2016 5.4%.  A1c 6.5% 05/2017 (this was his first A1c of 6.5% or more)--nutritionist referral recommended.   Diverticulosis 04/2016   Noted on CT 04/2016   Elevated PSA 06/2016   Dr. Redmond Pulling ([facility] urologist)-awaiting records (pt's wife reported pt's psa was 7).  Dr. Jeffie Pollock feels like this may have been a result of relatively recent episode of urinary retention, recheck PSA 12/29/16 was lower: 4.64, 24% f/t ratio, plan recheck 1 yr (Dr. Jeffie Pollock).    History of anemia due to chronic kidney disease    History of pyelonephritis 04/2016   History of sepsis 04/2016   e coli   Hypertension    Kidney stones    one episode   Recurrent UTI 2017   Renal cysts, acquired, bilateral    one dominant on R, Bosniak type II. (04/2016 CT)   Urge incontinence     Past Surgical History:  Procedure Laterality Date   CATARACT EXTRACTION, BILATERAL     HOLEP-LASER ENUCLEATION OF THE PROSTATE WITH MORCELLATION N/A 11/18/2019   Path benign. Procedure: HOLEP-LASER ENUCLEATION OF THE PROSTATE WITH MORCELLATION;  Surgeon: Billey Co, MD;  Location: [facility] ORS;  Service: Urology;  Laterality: N/A;   TONSILLECTOMY  1940   VENTRAL HERNIA REPAIR  2010   with strangulation of SB (approx 1 foot of SB had to be excised).  Had 3 total surgeries for this due to complications    Outpatient Medications Prior to Visit  Medication Sig Dispense Refill   Cholecalciferol (VITAMIN D3 PO) Take 400 Units by mouth daily.     hydrALAZINE (APRESOLINE) 25 MG tablet Take 3 tablets by mouth 3 times daily. 270 tablet 6   metoprolol succinate (TOPROL-XL) 50 MG 24 hr tablet TAKE 1 TABLET BY MOUTH ONCE DAILY WITH MEALS OR  IMMEDIATELY  FOLLOWING  A  MEAL 30 tablet 5   Multiple  Vitamin (MULTIVITAMIN) tablet Take 0.5 tablets by mouth daily.     No facility-administered medications prior to visit.    Allergies  Allergen Reactions   Amlodipine Other (See Comments)    Disequilibrium, generalized weakness.    ROS As per HPI  PE: Vitals with BMI 09/27/2020 07/19/2020 07/18/2020  Height '5\' 9"'$  '5\' 9"'$  '5\' 9"'$   Weight 165 lbs 10 oz 159 lbs 13 oz (No Data)  BMI [id number] -  Systolic 0000000 [id number]  Diastolic 75 63 70  Pulse 57 71 76   Gen: Alert, well appearing.  Patient is oriented to person, place, time,  and situation. AFFECT: pleasant, lucid thought and speech. CV: RRR, no m/r/g.   LUNGS: CTA bilat, nonlabored resps, good aeration in all lung fields. EXT: no clubbing or cyanosis.  no edema.  Strength and ROM 5/5 bilat UE and LE's.   LABS:    Chemistry      Component Value Date/Time   NA 138 08/14/2020 0000   K 5.2 08/14/2020 0000   CL 101 08/14/2020 0000   CO2 29 (A) 08/14/2020 0000   BUN 30 (A) 08/14/2020 0000   CREATININE 2.4 (A) 08/14/2020 0000   CREATININE 3.03 (H) 07/19/2020 1337   CREATININE 2.67 (H) 08/10/2019 1616   GLU 106 08/14/2020 0000      Component Value Date/Time   CALCIUM 9.9 08/14/2020 0000   ALKPHOS 67 12/13/2019 1032   AST 15 12/13/2019 1032   ALT 9 12/13/2019 1032   BILITOT 0.8 12/13/2019 1032     GFR 08/14/20= 23 ml/min  Lab Results  Component Value Date   HGBA1C 5.7 (A) 12/13/2019   HGBA1C 5.7 12/13/2019   HGBA1C 5.7 12/13/2019   Lab Results  Component Value Date   WBC 12.3 (H) 08/10/2019   HGB 16.5 08/10/2019   HCT 47.4 08/10/2019   MCV 91.3 08/10/2019   PLT 156 08/10/2019   Lab Results  Component Value Date   CHOL 178 07/12/2019   HDL 41.40 07/12/2019   LDLCALC 106 (H) 07/12/2019   TRIG 154.0 (H) 07/12/2019   CHOLHDL 4 07/12/2019   Lab Results  Component Value Date   TSH 1.86 12/13/2019   Lab Results  Component Value Date   HGBA1C 5.7 (A) 12/13/2019   HGBA1C 5.7 12/13/2019   HGBA1C 5.7 12/13/2019    IMPRESSION AND PLAN:  1) Debilitated patient, unstable gait, generalized functional weakness. Although UE strength testing against my resistance on exam today is normal, I definitely feel that pt's upper extremity function is insufficient to self-propel a manual wheelchair in the home. Mobility eval completed, paperwork filled out for motorized WC.  2) HTN: not ideal control. Increase hydralazine to 100 mg tid and continue toprol xl '50mg'$  qd.  3) CRI IV with borderline hyperkalemia.  Multifactorial->primarily obstructive uropathy but some component of microvasc dz from htn contributing. Avoid high potassium foods. Working on bp control (#2 above). He is now off lisin-hct. Monitor bmet today.  4) BPH with LUT obst, hx of recurrent urinary retention. HoLEP 10/2019 helpful. Emptying bladder well now on no meds.  5) DM, diet controlled. Hba1c today.  An After Visit Summary was printed and given to the patient.  FOLLOW UP: Return in about 3 months (around 12/27/2020) for routine chronic illness f/u.  Signed:  Crissie Sickles, MD           09/27/2020

## 2020-09-28 NOTE — Telephone Encounter (Signed)
Spoke with pt's wife, Geraldine(DPR) to advise of med instructions.

## 2020-10-06 ENCOUNTER — Encounter: Payer: Self-pay | Admitting: Family Medicine

## 2020-10-08 NOTE — Telephone Encounter (Signed)
Please Advise

## 2020-10-11 DIAGNOSIS — N184 Chronic kidney disease, stage 4 (severe): Secondary | ICD-10-CM | POA: Diagnosis not present

## 2020-10-11 LAB — BASIC METABOLIC PANEL
BUN: 34 — AB (ref 4–21)
Creatinine: 2.6 — AB (ref 0.6–1.3)
Potassium: 4.1 (ref 3.4–5.3)

## 2020-10-11 LAB — CBC AND DIFFERENTIAL: Hemoglobin: 13.9 (ref 13.5–17.5)

## 2020-10-11 LAB — COMPREHENSIVE METABOLIC PANEL: GFR calc non Af Amer: 23

## 2020-10-17 DIAGNOSIS — N184 Chronic kidney disease, stage 4 (severe): Secondary | ICD-10-CM | POA: Diagnosis not present

## 2020-10-17 DIAGNOSIS — I129 Hypertensive chronic kidney disease with stage 1 through stage 4 chronic kidney disease, or unspecified chronic kidney disease: Secondary | ICD-10-CM | POA: Diagnosis not present

## 2020-10-17 DIAGNOSIS — N281 Cyst of kidney, acquired: Secondary | ICD-10-CM | POA: Diagnosis not present

## 2020-10-17 DIAGNOSIS — E1122 Type 2 diabetes mellitus with diabetic chronic kidney disease: Secondary | ICD-10-CM | POA: Diagnosis not present

## 2020-10-17 DIAGNOSIS — R300 Dysuria: Secondary | ICD-10-CM | POA: Diagnosis not present

## 2020-10-17 DIAGNOSIS — R339 Retention of urine, unspecified: Secondary | ICD-10-CM | POA: Diagnosis not present

## 2020-10-18 ENCOUNTER — Ambulatory Visit: Payer: Medicare Other | Admitting: Urology

## 2020-10-18 ENCOUNTER — Encounter: Payer: Self-pay | Admitting: Family Medicine

## 2020-10-18 NOTE — Telephone Encounter (Signed)
FYI  Please see below

## 2020-10-29 ENCOUNTER — Encounter: Payer: Self-pay | Admitting: Family Medicine

## 2020-10-30 NOTE — Telephone Encounter (Signed)
At this point I'm going to defer the next bp treatment change to Dr. Royce Macadamia b/c she most recently added lisinopril in April and was following his kidney function after adding that medication. Let me know if pt has not gotten any further instructions from Dr. Royce Macadamia in the next day or two.-thx

## 2020-11-02 DIAGNOSIS — E875 Hyperkalemia: Secondary | ICD-10-CM | POA: Diagnosis not present

## 2020-11-19 ENCOUNTER — Ambulatory Visit: Payer: Medicare Other | Admitting: Family Medicine

## 2020-12-03 ENCOUNTER — Encounter: Payer: Self-pay | Admitting: Family Medicine

## 2020-12-03 MED ORDER — CYCLOBENZAPRINE HCL 5 MG PO TABS: ORAL_TABLET | ORAL | 1 refills | Status: DC

## 2020-12-03 NOTE — Telephone Encounter (Signed)
Pt requesting refill for cyclobenzaprine. Med pending. Last seen 09/27/20

## 2020-12-14 ENCOUNTER — Other Ambulatory Visit: Payer: Self-pay | Admitting: Family Medicine

## 2020-12-26 ENCOUNTER — Ambulatory Visit: Payer: Medicare Other | Admitting: Urology

## 2020-12-27 ENCOUNTER — Encounter: Payer: Self-pay | Admitting: Family Medicine

## 2020-12-31 ENCOUNTER — Other Ambulatory Visit: Payer: Self-pay | Admitting: Family Medicine

## 2021-01-03 ENCOUNTER — Encounter: Payer: Self-pay | Admitting: Family Medicine

## 2021-01-04 ENCOUNTER — Telehealth: Payer: Self-pay

## 2021-01-04 NOTE — Telephone Encounter (Signed)
OK to NOT take hydralazine since it is making him feel sick and dizzy.

## 2021-01-04 NOTE — Telephone Encounter (Signed)
A user error has taken place: encounter opened in error, closed for administrative reasons.

## 2021-01-08 MED ORDER — DOXAZOSIN MESYLATE 4 MG PO TABS: 4.0000 mg | ORAL_TABLET | Freq: Every day | ORAL | 0 refills | Status: DC

## 2021-01-08 NOTE — Telephone Encounter (Signed)
Please advise, pt has f/u on 7/14

## 2021-01-08 NOTE — Telephone Encounter (Signed)
Pt wife called noting pt stopped taking hydrALAZINE (APRESOLINE) 100 MG tablet on 01/04/2021. Pt BP today 183/105 and yesterday 190/106. Please call to f/u.  Ph # [phone]

## 2021-01-08 NOTE — Telephone Encounter (Signed)
OK. I will send in rx for doxazosin for him to start today: take one tab once a day. Continue metoprolol and lisinopril as-is (is he still taking '40mg'$  lisinopril?).  Keep appt for 7/14. Request most recent office note and labs from his nephrologist Dr. Royce Macadamia.

## 2021-01-09 ENCOUNTER — Other Ambulatory Visit: Payer: Self-pay

## 2021-01-10 ENCOUNTER — Other Ambulatory Visit: Payer: Self-pay

## 2021-01-10 ENCOUNTER — Encounter: Payer: Self-pay | Admitting: Family Medicine

## 2021-01-10 ENCOUNTER — Ambulatory Visit (INDEPENDENT_AMBULATORY_CARE_PROVIDER_SITE_OTHER): Payer: Medicare Other | Admitting: Family Medicine

## 2021-01-10 VITALS — BP 137/75 | HR 66 | Temp 97.9°F | Resp 16 | Ht 69.0 in | Wt 163.6 lb

## 2021-01-10 DIAGNOSIS — R5381 Other malaise: Secondary | ICD-10-CM | POA: Diagnosis not present

## 2021-01-10 DIAGNOSIS — I10 Essential (primary) hypertension: Secondary | ICD-10-CM | POA: Diagnosis not present

## 2021-01-10 DIAGNOSIS — N184 Chronic kidney disease, stage 4 (severe): Secondary | ICD-10-CM

## 2021-01-10 LAB — BASIC METABOLIC PANEL
BUN: 37 mg/dL — ABNORMAL HIGH (ref 6–23)
BUN: 37 — AB (ref 4–21)
CO2: 26 mEq/L (ref 19–32)
Calcium: 9 mg/dL (ref 8.4–10.5)
Chloride: 105 mEq/L (ref 96–112)
Creatinine, Ser: 2.67 mg/dL — ABNORMAL HIGH (ref 0.40–1.50)
Creatinine: 2.7 — AB (ref 0.6–1.3)
GFR: 20.92 mL/min — ABNORMAL LOW (ref >60.00)
Glucose, Bld: 106 mg/dL — ABNORMAL HIGH (ref 70–99)
Potassium: 4.3 (ref 3.4–5.3)
Potassium: 4.3 mEq/L (ref 3.5–5.1)
Sodium: 140 mEq/L (ref 135–145)

## 2021-01-10 LAB — COMPREHENSIVE METABOLIC PANEL: GFR calc non Af Amer: 31

## 2021-01-10 MED ORDER — DOXAZOSIN MESYLATE 8 MG PO TABS: 8.0000 mg | ORAL_TABLET | Freq: Every day | ORAL | 3 refills | Status: DC

## 2021-01-10 NOTE — Progress Notes (Signed)
OFFICE VISIT  01/10/2021  CC:  Chief Complaint  Patient presents with   Follow-up    RCI, pt is fasting   HPI:    Patient is a 87 y.o. Caucasian male who presents accompanied by his wife [name] for 3 mo f/u HTN in the setting of CRI IV. A/P as of last visit: "1) Debilitated patient, unstable gait. Mobility eval completed, paperwork filled out for motorized WC.   2) HTN: not ideal control. Increase hydralazine to 100 mg tid and continue toprol xl '50mg'$  qd.   3) CRI IV with borderline hyperkalemia.  Multifactorial->primarily obstructive uropathy but some component of microvasc dz from htn contributing. Avoid high potassium foods. Working on bp control (#2 above). He is now off lisin-hct. Monitor bmet today.   4) BPH with LUT obst, hx of recurrent urinary retention. HoLEP 10/2019 helpful. Emptying bladder well now on no meds.   5) DM, diet controlled. Hba1c today."  INTERIM HX: Pt could not tolerate hydralazine d/t feeling sedated and nauseous consistently after taking each dose. I recently started doxazosin '4mg'$  qd in its place. He has remained on toprol xl '50mg'$  qd and lisinopril 40 qd.  Home bp's last couple days since starting doxazosin are a bit improved -->150s/90s, HR 50s- 60s. Feels generalized weakness chronically, mostly in legs.  Uses walker but doesn't walk around the house much.  He had home PT in recent past but he did not like them coming and stopped it quickly.   Appetite and fluid intake is great. Eats meat well.  ROS as above, plus--> no fevers, no CP, no SOB, no wheezing, no cough, no dizziness, no HAs, no rashes, no melena/hematochezia.  No polyuria or polydipsia.  No myalgias or arthralgias.  No focal weakness, paresthesias, or tremors.  No acute vision or hearing abnormalities.  No dysuria or unusual/new urinary urgency or frequency.  No recent changes in lower legs. No n/v/d or abd pain.  No palpitations.    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 Dois Davenport (urol).  Dr. Jeffie Pollock suspects this was due to stretch injury and has resolved as of 09/24/16.   Arthritis of right wrist    s/p fracture of middle third of scaphoid bone sustained cleaning up from [facility] injection by Dr. Napoleon Form in [place] (ortho) made the pain go away.   BPH with obstruction/lower urinary tract symptoms    hx of urinary retention (s/p foley cath when in hosp for urosepsis 04/2016).  +Areflexic bladder.  TURP will not help since he has acontractile bladder.  Options are CIC, indwelling urethral foley, or suprapubic catheter, HOLEP or simple prostatectomy.  HOLEP done 10/2019.   Chronic low back pain    Chronic renal insufficiency, stage 4 (severe) (HCC)    CrCl 30s (saw nephrologist in [place]). +microalbuminuria 04/2014 at PCP in [place].  Stage IV 2018: GFR upper 20s. Obstruc urop contrib, bilat hydroureteroneph->HoLEP 11/2019. Recur urin retent, GFR dec to about 20->to nephrol 2022.   COPD (chronic obstructive pulmonary disease) ([place])    on CXR 09/27/2015   Diabetes mellitus ([place])    2015 HbA1c 6.2-6.3 %.  HbA1c 06/2016 5.4%.  A1c 6.5% 05/2017 (this was his first A1c of 6.5% or more)--nutritionist referral recommended.   Diverticulosis 04/2016   Noted on CT 04/2016   Elevated PSA 06/2016   Dr. Redmond Pulling ([facility] urologist)-awaiting records (pt's wife reported pt's psa was 7).  Dr. Jeffie Pollock feels like this may have  been a result of relatively recent episode of urinary retention, recheck PSA 12/29/16 was lower: 4.64, 24% f/t ratio, plan recheck 1 yr (Dr. Jeffie Pollock).    History of anemia due to chronic kidney disease    History of pyelonephritis 04/2016   History of sepsis 04/2016   e coli   Hypertension    Kidney stones    one episode   Recurrent UTI 2017   Renal cysts, acquired, bilateral    one dominant on R, Bosniak type II. (04/2016 CT)   Urge incontinence     Past Surgical History:   Procedure Laterality Date   CATARACT EXTRACTION, BILATERAL     HOLEP-LASER ENUCLEATION OF THE PROSTATE WITH MORCELLATION N/A 11/18/2019   Path benign. Procedure: HOLEP-LASER ENUCLEATION OF THE PROSTATE WITH MORCELLATION;  Surgeon: Billey Co, MD;  Location: [facility] ORS;  Service: Urology;  Laterality: N/A;   TONSILLECTOMY  1940   VENTRAL HERNIA REPAIR  2010   with strangulation of SB (approx 1 foot of SB had to be excised).  Had 3 total surgeries for this due to complications    Outpatient Medications Prior to Visit  Medication Sig Dispense Refill   Cholecalciferol (VITAMIN D3 PO) Take 1,000 Units by mouth every other day.     lisinopril (ZESTRIL) 20 MG tablet Take 40 mg by mouth daily.     metoprolol succinate (TOPROL-XL) 50 MG 24 hr tablet TAKE 1 TABLET BY MOUTH ONCE DAILY WITH MEALS OR IMMEDIATELY FOLLOWING A MEAL. 30 tablet 0   doxazosin (CARDURA) 4 MG tablet Take 1 tablet (4 mg total) by mouth daily. 30 tablet 0   cyclobenzaprine (FLEXERIL) 5 MG tablet 1 tab po bid prn neck pain (Patient not taking: Reported on 01/10/2021) 30 tablet 1   hydrALAZINE (APRESOLINE) 100 MG tablet TAKE 1 TABLET BY MOUTH THREE TIMES DAILY (Patient not taking: Reported on 01/10/2021) 90 tablet 0   No facility-administered medications prior to visit.    Allergies  Allergen Reactions   Amlodipine Other (See Comments)    Disequilibrium, generalized weakness.    ROS As per HPI  PE: Vitals with BMI 01/10/2021 09/27/2020 07/19/2020  Height '5\' 9"'$  '5\' 9"'$  '5\' 9"'$   Weight 163 lbs 10 oz 165 lbs 10 oz 159 lbs 13 oz  BMI 24.15 [id number]  Systolic 0000000 0000000 [id number]  Diastolic 75 75 63  Pulse 66 57 71     Gen: Alert, frail-appearing, sitting in WC.  Patient is oriented to person, place, time, and situation. AFFECT: pleasant, lucid thought and speech. CV: RRR, no m/r/g.   LUNGS: CTA bilat, nonlabored resps, good aeration in all lung fields. EXT: no clubbing or cyanosis.  no edema.  LL's 5-/5 strength prox/dist  bilat.    LABS:  Lab Results  Component Value Date   TSH 1.86 12/13/2019   Lab Results  Component Value Date   WBC 12.3 (H) 08/10/2019   HGB 13.9 10/11/2020   HCT 47.4 08/10/2019   MCV 91.3 08/10/2019   PLT 156 08/10/2019   Lab Results  Component Value Date   CREATININE 2.6 (A) 10/11/2020   BUN 34 (A) 10/11/2020   NA 142 09/27/2020   K 4.1 10/11/2020   CL 104 09/27/2020   CO2 28 09/27/2020   Lab Results  Component Value Date   ALT 9 12/13/2019   AST 15 12/13/2019   ALKPHOS 67 12/13/2019   BILITOT 0.8 12/13/2019   Lab Results  Component Value Date   CHOL 178 07/12/2019  Lab Results  Component Value Date   HDL 41.40 07/12/2019   Lab Results  Component Value Date   LDLCALC 106 (H) 07/12/2019   Lab Results  Component Value Date   TRIG 154.0 (H) 07/12/2019   Lab Results  Component Value Date   CHOLHDL 4 07/12/2019   Lab Results  Component Value Date   PSA 7.0 07/11/2016   Lab Results  Component Value Date   HGBA1C 5.5 09/27/2020   Lab Results  Component Value Date   VITAMINB12 333 12/13/2019   IMPRESSION AND PLAN:  1) HTN, uncontrolled. Intol of hydralazine and amlodipine.  HR precludes increase in BB. BP a little better with recent addition of cardura '4mg'$  qd. Increase cardura to 8 mg qd, cont lisinopril 40 qd, and cont toprol xl '50mg'$  qd. Cont home bp monitoring. He has f/u with Dr. Royce Macadamia in nephrology in 2 wks. Pt keeps [place] apprised of bps and how he's doing using [employer].  2) CRI IV with hx of borderline hyperkalemia.  Multifactorial->primarily obstructive uropathy but some component of microvasc dz from htn contributing. Followed by nephrology. Avoid high potassium foods. Monitor bmet today--forward results to Dr. Royce Macadamia, nephrologist.  3) Debilitated pt: encouraged him to gradually increase walking in home with his walker---goal is walking a circuit in his home 8 times a day.  Continue self directed strengthening/toning exercises  that he does in WC.  He refuses HH PT. He is eating/drinking well, maintaining wt.  An After Visit Summary was printed and given to the patient.  FOLLOW UP: Return in about 2 months (around 03/13/2021) for routine chronic illness f/u.  Signed:  Crissie Sickles, MD           01/10/2021

## 2021-01-11 ENCOUNTER — Encounter: Payer: Self-pay | Admitting: Family Medicine

## 2021-01-17 ENCOUNTER — Encounter: Payer: Self-pay | Admitting: Family Medicine

## 2021-01-17 MED ORDER — METOPROLOL SUCCINATE ER 50 MG PO TB24: ORAL_TABLET | ORAL | 0 refills | Status: DC

## 2021-01-18 ENCOUNTER — Other Ambulatory Visit: Payer: Self-pay | Admitting: Family Medicine

## 2021-01-23 ENCOUNTER — Encounter: Payer: Self-pay | Admitting: Family Medicine

## 2021-01-23 NOTE — Telephone Encounter (Signed)
Yes, trying a written rx directly handed to medical supply company is the best we could do. I'll write it and place on your desk.

## 2021-01-25 DIAGNOSIS — E1122 Type 2 diabetes mellitus with diabetic chronic kidney disease: Secondary | ICD-10-CM | POA: Diagnosis not present

## 2021-01-25 DIAGNOSIS — N281 Cyst of kidney, acquired: Secondary | ICD-10-CM | POA: Diagnosis not present

## 2021-01-25 DIAGNOSIS — R338 Other retention of urine: Secondary | ICD-10-CM | POA: Diagnosis not present

## 2021-01-25 DIAGNOSIS — N401 Enlarged prostate with lower urinary tract symptoms: Secondary | ICD-10-CM | POA: Diagnosis not present

## 2021-01-25 DIAGNOSIS — I129 Hypertensive chronic kidney disease with stage 1 through stage 4 chronic kidney disease, or unspecified chronic kidney disease: Secondary | ICD-10-CM | POA: Diagnosis not present

## 2021-01-25 DIAGNOSIS — N184 Chronic kidney disease, stage 4 (severe): Secondary | ICD-10-CM | POA: Diagnosis not present

## 2021-01-31 NOTE — Telephone Encounter (Signed)
OK, I addended my 09/27/20 encounter note, stating my impression that the patient's functional strength is INSUFFICIENT to self propel manual WC. We'll see if this helps.

## 2021-02-16 ENCOUNTER — Encounter: Payer: Self-pay | Admitting: Family Medicine

## 2021-02-18 MED ORDER — LISINOPRIL 40 MG PO TABS: 40.0000 mg | ORAL_TABLET | Freq: Every day | ORAL | 3 refills | Status: DC

## 2021-02-18 NOTE — Telephone Encounter (Signed)
OK, lisinopril 40 eRx'd

## 2021-03-08 ENCOUNTER — Encounter: Payer: Self-pay | Admitting: Family Medicine

## 2021-03-08 NOTE — Telephone Encounter (Signed)
Facetime visit is fine

## 2021-03-08 NOTE — Telephone Encounter (Signed)
Please advise on appropriate scheduling.  Spoke with [employer] and faxed updated information for Dx code.

## 2021-03-14 ENCOUNTER — Telehealth (INDEPENDENT_AMBULATORY_CARE_PROVIDER_SITE_OTHER): Payer: Medicare Other | Admitting: Family Medicine

## 2021-03-14 ENCOUNTER — Encounter: Payer: Self-pay | Admitting: Family Medicine

## 2021-03-14 ENCOUNTER — Telehealth: Payer: Self-pay

## 2021-03-14 ENCOUNTER — Other Ambulatory Visit: Payer: Self-pay

## 2021-03-14 VITALS — BP 177/99 | HR 57

## 2021-03-14 DIAGNOSIS — I10 Essential (primary) hypertension: Secondary | ICD-10-CM

## 2021-03-14 DIAGNOSIS — R5381 Other malaise: Secondary | ICD-10-CM

## 2021-03-14 DIAGNOSIS — N184 Chronic kidney disease, stage 4 (severe): Secondary | ICD-10-CM | POA: Diagnosis not present

## 2021-03-14 DIAGNOSIS — R7303 Prediabetes: Secondary | ICD-10-CM

## 2021-03-14 MED ORDER — METOPROLOL SUCCINATE ER 50 MG PO TB24: ORAL_TABLET | ORAL | 3 refills | Status: DC

## 2021-03-14 NOTE — Telephone Encounter (Signed)
Spoke with pharmacy to clarify sig directions; based on OV note from today he should continue once daily. Nothing further needed.

## 2021-03-14 NOTE — Telephone Encounter (Signed)
FYI  Spoke with Margreta Journey, Psychologist, educational at [employer], they are in the process of getting a PA from medicare. She is waiting on an answer from [facility], she handles medicare. PA has been resubmitted on 9/9. They are having a system update and no further updates are available until Monday. Pt's wife has been made aware of update as well. She voiced understanding.

## 2021-03-14 NOTE — Progress Notes (Signed)
Virtual Visit via Video Note  I connected with Gary Morgan and wife Gary Morgan on 03/14/21 at 10:00 AM EDT by a video enabled telemedicine application and verified that I am speaking with the correct person using two identifiers.  Location patient: home, [place] Location provider:work or home office Persons participating in the virtual visit: patient, provider  I discussed the limitations of evaluation and management by telemedicine and the availability of in person appointments. The patient expressed understanding and agreed to proceed.   HPI: 87 y/o WM being seen today for 2 mo f/u uncontrolled HTN, CRI IV, debilitation with recurrent falls. A/P as of last visit: "1) HTN, uncontrolled. Intol of hydralazine and amlodipine.  HR precludes increase in BB. BP a little better with recent addition of cardura '4mg'$  qd. Increase cardura to 8 mg qd, cont lisinopril 40 qd, and cont toprol xl '50mg'$  qd. Cont home bp monitoring. He has f/u with Dr. Royce Macadamia in nephrology in 2 wks. Pt keeps [place] apprised of bps and how he's doing using [employer].   2) CRI IV with hx of borderline hyperkalemia.  Multifactorial->primarily obstructive uropathy but some component of microvasc dz from htn contributing. Followed by nephrology. Avoid high potassium foods. Monitor bmet today--forward results to Dr. Royce Macadamia, nephrologist.   3) Debilitated pt: encouraged him to gradually increase walking in home with his walker---goal is walking a circuit in his home 8 times a day.  Continue self directed strengthening/toning exercises that he does in WC.  He refuses HH PT. He is eating/drinking well, maintaining wt."  INTERIM HX: Pt states he is feeling well. Still haven't gotten the motorized WC, medical supply company issues per wife's report. Still signif generalized weakness, legs often buckle when he tries to walk.  No falls, though.  Today and yesterday bp up --pt constipated and always goes up in this context. Just restarted  miralax and senakot S qd and feeling better now.  BP:  130s-140s/80s for the most part. HR consistently 50s.  Some increased cognitive dysfunction, short term memory problems---slight increased. No prolonged periods of confusion or disorientation.  ROS as above, plus--> no fevers, no CP, no SOB, no wheezing, no cough, no dizziness, no HAs, no rashes, no melena/hematochezia.  No polyuria or polydipsia.  No myalgias or arthralgias.  No focal weakness, paresthesias, or tremors.  No acute vision or hearing abnormalities.  Chronic urinary incontinence.  No recent changes in lower legs. No n/v/d or abd pain.  No palpitations.     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 Dois Davenport (urol).  Dr. Jeffie Pollock suspects this was due to stretch injury and has resolved as of 09/24/16.   Arthritis of right wrist    s/p fracture of middle third of scaphoid bone sustained cleaning up from [facility] injection by Dr. Napoleon Form in [place] (ortho) made the pain go away.   BPH with obstruction/lower urinary tract symptoms    hx of urinary retention (s/p foley cath when in hosp for urosepsis 04/2016).  +Areflexic bladder.  TURP will not help since he has acontractile bladder.  Options are CIC, indwelling urethral foley, or suprapubic catheter, HOLEP or simple prostatectomy.  HOLEP done 10/2019.   Chronic low back pain    Chronic renal insufficiency, stage 4 (severe) (HCC)    CrCl 30s (saw nephrologist in [place]). +microalbuminuria 04/2014 at PCP in [place].  Stage IV 2018: GFR upper 20s. Obstruc urop contrib, bilat hydroureteroneph->HoLEP 11/2019. Recur urin retent, GFR  dec to about 20->to nephrol 2022.   COPD (chronic obstructive pulmonary disease) ([place])    on CXR 09/27/2015   Debilitated patient    Diabetes mellitus ([place])    2015 HbA1c 6.2-6.3 %.  HbA1c 06/2016 5.4%.  A1c 6.5% 05/2017 (this was his first A1c of 6.5% or  more)--nutritionist referral recommended.   Diverticulosis 04/2016   Noted on CT 04/2016   Elevated PSA 06/2016   Dr. Redmond Pulling ([facility] urologist)-awaiting records (pt's wife reported pt's psa was 7).  Dr. Jeffie Pollock feels like this may have been a result of relatively recent episode of urinary retention, recheck PSA 12/29/16 was lower: 4.64, 24% f/t ratio, plan recheck 1 yr (Dr. Jeffie Pollock).    History of anemia due to chronic kidney disease    History of pyelonephritis 04/2016   History of sepsis 04/2016   e coli   Hypertension    Kidney stones    one episode   Recurrent UTI 2017   Renal cysts, acquired, bilateral    one dominant on R, Bosniak type II. (04/2016 CT)   Unstable gait    Urge incontinence     Past Surgical History:  Procedure Laterality Date   CATARACT EXTRACTION, BILATERAL     HOLEP-LASER ENUCLEATION OF THE PROSTATE WITH MORCELLATION N/A 11/18/2019   Path benign. Procedure: HOLEP-LASER ENUCLEATION OF THE PROSTATE WITH MORCELLATION;  Surgeon: Billey Co, MD;  Location: [facility] ORS;  Service: Urology;  Laterality: N/A;   TONSILLECTOMY  1940   VENTRAL HERNIA REPAIR  2010   with strangulation of SB (approx 1 foot of SB had to be excised).  Had 3 total surgeries for this due to complications     Current Outpatient Medications:    doxazosin (CARDURA) 8 MG tablet, Take 1 tablet (8 mg total) by mouth daily., Disp: 90 tablet, Rfl: 3   lisinopril (ZESTRIL) 40 MG tablet, Take 1 tablet (40 mg total) by mouth daily., Disp: 90 tablet, Rfl: 3   metoprolol succinate (TOPROL-XL) 50 MG 24 hr tablet, Take with or immediately following a meal., Disp: 90 tablet, Rfl: 0   Cholecalciferol (VITAMIN D3 PO), Take 1,000 Units by mouth every other day. (Patient not taking: Reported on 03/14/2021), Disp: , Rfl:    cyclobenzaprine (FLEXERIL) 5 MG tablet, 1 tab po bid prn neck pain (Patient not taking: No sig reported), Disp: 30 tablet, Rfl: 1  EXAM:  VITALS per patient if applicable:  Vitals with BMI  03/14/2021 01/10/2021 09/27/2020  Height - '5\' 9"'$  '5\' 9"'$   Weight - 163 lbs 10 oz 165 lbs 10 oz  BMI - 0000000 [id number]  Systolic 123XX123 0000000 0000000  Diastolic 99 75 75  Pulse 57 66 57     GENERAL: alert, oriented, appears well and in no acute distress  HEENT: atraumatic, conjunttiva clear, no obvious abnormalities on inspection of external nose and ears  NECK: normal movements of the head and neck  LUNGS: on inspection no signs of respiratory distress, breathing rate appears normal, no obvious gross SOB, gasping or wheezing  CV: no obvious cyanosis  MS: moves all visible extremities without noticeable abnormality  PSYCH/NEURO: pleasant and cooperative, no obvious depression or anxiety, speech and thought processing grossly intact  LABS: none today  Lab Results  Component Value Date   TSH 1.86 12/13/2019   Lab Results  Component Value Date   WBC 12.3 (H) 08/10/2019   HGB 13.9 10/11/2020   HCT 47.4 08/10/2019   MCV 91.3 08/10/2019   PLT 156 08/10/2019  Lab Results  Component Value Date   CREATININE 2.67 (H) 01/10/2021   BUN 37 (H) 01/10/2021   NA 140 01/10/2021   K 4.3 01/10/2021   CL 105 01/10/2021   CO2 26 01/10/2021   Lab Results  Component Value Date   ALT 9 12/13/2019   AST 15 12/13/2019   ALKPHOS 67 12/13/2019   BILITOT 0.8 12/13/2019   Lab Results  Component Value Date   CHOL 178 07/12/2019   Lab Results  Component Value Date   HDL 41.40 07/12/2019   Lab Results  Component Value Date   LDLCALC 106 (H) 07/12/2019   Lab Results  Component Value Date   TRIG 154.0 (H) 07/12/2019   Lab Results  Component Value Date   CHOLHDL 4 07/12/2019   Lab Results  Component Value Date   PSA 7.0 07/11/2016   Lab Results  Component Value Date   HGBA1C 5.5 09/27/2020   ASSESSMENT AND PLAN:  Discussed the following assessment and plan:  1) HTN, control is adequate, esp in the context of minimal-to-no further options on bp med use. Cont toprol xl 50 qd, cardura  '8mg'$  qd, and lisinopril 40 qd.  2) CRI IV: GFR has been hovering around 20. He chooses to not return to nephrologist b/c he is firm in his decision to never be on dialysis. I'll recheck bmet in about 1 mo.  3) Chronic debilitation,generalized weakness, inability to walk safely and inability to self propel WC or balance adequately on motorized scooter. We've done all necessary paperwork, etc for motorized WC and this is really delayed in coming to fruition.  Will call medical supply to see if we can expedite the process any.  4) Borderline DM: next a1c 1 mo.  I discussed the assessment and treatment plan with the patient. The patient was provided an opportunity to ask questions and all were answered. The patient agreed with the plan and demonstrated an understanding of the instructions.   F/u: 3 mo  Signed:  Crissie Sickles, MD           03/14/2021

## 2021-03-14 NOTE — Telephone Encounter (Signed)
Spoke with Jacqlyn Larsen at [facility] regarding expediting this process; she mentioned the medicare/medicaid system is currently down but a Freight forwarder would look into this and follow up with [place].

## 2021-03-14 NOTE — Telephone Encounter (Signed)
Noted  

## 2021-03-14 NOTE — Telephone Encounter (Signed)
Received a call from [facility], states they have a question about the prescription metoprolol succinate (TOPROL-XL) 50 MG 24 hr tablet.

## 2021-03-14 NOTE — Telephone Encounter (Signed)
[  11:03 AM] Ricardo Jericho The motorized [facility] saga continues for [employer].  Pls call [facility] [employer] in [place] to see if there is anything we can do to expedite this process: [phone]. FYI wife says she has talked to [place] and [place] there.--thx

## 2021-03-18 ENCOUNTER — Telehealth: Payer: Self-pay

## 2021-03-18 NOTE — Telephone Encounter (Signed)
Spoke with pt wife to sched AWV. Wife declined at this time.

## 2021-04-13 ENCOUNTER — Encounter: Payer: Self-pay | Admitting: Family Medicine

## 2021-06-13 ENCOUNTER — Ambulatory Visit: Payer: Medicare Other | Admitting: Family Medicine

## 2021-06-26 ENCOUNTER — Encounter: Payer: Self-pay | Admitting: Family Medicine

## 2021-06-26 ENCOUNTER — Other Ambulatory Visit: Payer: Self-pay

## 2021-06-26 ENCOUNTER — Ambulatory Visit (INDEPENDENT_AMBULATORY_CARE_PROVIDER_SITE_OTHER): Payer: Medicare Other | Admitting: Family Medicine

## 2021-06-26 VITALS — BP 130/90 | HR 55 | Temp 97.6°F | Ht 69.0 in | Wt 163.6 lb

## 2021-06-26 DIAGNOSIS — R7303 Prediabetes: Secondary | ICD-10-CM | POA: Diagnosis not present

## 2021-06-26 DIAGNOSIS — N184 Chronic kidney disease, stage 4 (severe): Secondary | ICD-10-CM

## 2021-06-26 DIAGNOSIS — R5381 Other malaise: Secondary | ICD-10-CM | POA: Diagnosis not present

## 2021-06-26 DIAGNOSIS — I10 Essential (primary) hypertension: Secondary | ICD-10-CM

## 2021-06-26 DIAGNOSIS — R531 Weakness: Secondary | ICD-10-CM

## 2021-06-26 LAB — BASIC METABOLIC PANEL
BUN: 31 mg/dL — ABNORMAL HIGH (ref 6–23)
CO2: 30 mEq/L (ref 19–32)
Calcium: 8.9 mg/dL (ref 8.4–10.5)
Chloride: 104 mEq/L (ref 96–112)
Creatinine, Ser: 2.48 mg/dL — ABNORMAL HIGH (ref 0.40–1.50)
GFR: 22.79 mL/min — ABNORMAL LOW (ref >60.00)
Glucose, Bld: 86 mg/dL (ref 70–99)
Potassium: 4.4 mEq/L (ref 3.5–5.1)
Sodium: 141 mEq/L (ref 135–145)

## 2021-06-26 LAB — HEMOGLOBIN A1C: Hgb A1c MFr Bld: 5.9 % (ref 4.6–6.5)

## 2021-06-26 MED ORDER — CLONIDINE HCL 0.1 MG PO TABS: 0.1000 mg | ORAL_TABLET | Freq: Three times a day (TID) | ORAL | 1 refills | Status: DC

## 2021-06-26 NOTE — Progress Notes (Signed)
OFFICE VISIT  06/26/2021  CC:  Chief Complaint  Patient presents with   Follow-up    RCI; not fasting   HPI:    Patient is a 87 y.o. male who presents accompanied by his wife [name] for 3 mo f/u HTN, CRI IV, and chronic debilitation. A/P as of last visit: "1) HTN, control is adequate, esp in the context of minimal-to-no further options on bp med use. Cont toprol xl 50 qd, cardura 8mg  qd, and lisinopril 40 qd.   2) CRI IV: GFR has been hovering around 20. He chooses to not return to nephrologist b/c he is firm in his decision to never be on dialysis. I'll recheck bmet in about 1 mo.   3) Chronic debilitation,generalized weakness, inability to walk safely and inability to self propel WC or balance adequately on motorized scooter. We've done all necessary paperwork, etc for motorized WC and this is really delayed in coming to fruition.  Will call medical supply to see if we can expedite the process any.   4) Borderline DM: next a1c 1 mo."  INTERIM HX: Gary Morgan is doing pretty good.  Feels his energy level coming back.  He has been eating very well and drinking fluids well. He did get his motorized wheelchair and uses it some, but has seemed to not need it is much as he thought.  He is using his walker and his manual wheelchair some. Feels like he empties his bladder well, still with polyuria and some incontinence.  Nocturia x4.  Home blood pressures seem to be up and down.  Sometimes in the 120s over 70s but mostly 150s over 90s or so.  Occasionally up to 702 systolic and around 637 diastolic.  Heart rate consistently 50s to 60s.  ROS as above, plus--> no fevers, no CP, no SOB, no wheezing, no cough, no dizziness, no HAs, no rashes, no melena/hematochezia.  No polyuria or polydipsia.  No myalgias or arthralgias.  No focal weakness, paresthesias, or tremors.  No acute vision or hearing abnormalities.  No dysuria or unusual/new urinary urgency or frequency.  No recent changes in lower  legs. No n/v/d or abd pain.  No palpitations.     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 Dois Davenport (urol).  Dr. Jeffie Pollock suspects this was due to stretch injury and has resolved as of 09/24/16.   Arthritis of right wrist    s/p fracture of middle third of scaphoid bone sustained cleaning up from [facility] injection by Dr. Napoleon Form in [place] (ortho) made the pain go away.   BPH with obstruction/lower urinary tract symptoms    hx of urinary retention (s/p foley cath when in hosp for urosepsis 04/2016).  +Areflexic bladder.  TURP will not help since he has acontractile bladder.  Options are CIC, indwelling urethral foley, or suprapubic catheter, HOLEP or simple prostatectomy.  HOLEP done 10/2019.   Chronic low back pain    Chronic renal insufficiency, stage 4 (severe) (HCC)    CrCl 30s (saw nephrologist in [place]). +microalbuminuria 04/2014 at PCP in [place].  Stage IV 2018: GFR upper 20s. Obstruc urop contrib, bilat hydroureteroneph->HoLEP 11/2019. Recur urin retent, GFR dec to about 20->to nephrol 2022.   COPD (chronic obstructive pulmonary disease) ([place])    on CXR 09/27/2015   Debilitated patient    Diabetes mellitus ([place])    2015 HbA1c 6.2-6.3 %.  HbA1c 06/2016 5.4%.  A1c 6.5% 05/2017 (this was his first A1c  of 6.5% or more)--nutritionist referral recommended.   Diverticulosis 04/2016   Noted on CT 04/2016   Elevated PSA 06/2016   Dr. Redmond Pulling ([facility] urologist)-awaiting records (pt's wife reported pt's psa was 7).  Dr. Jeffie Pollock feels like this may have been a result of relatively recent episode of urinary retention, recheck PSA 12/29/16 was lower: 4.64, 24% f/t ratio, plan recheck 1 yr (Dr. Jeffie Pollock).    History of anemia due to chronic kidney disease    History of pyelonephritis 04/2016   History of sepsis 04/2016   e coli   Hypertension    Kidney stones    one episode   Recurrent UTI 2017   Renal cysts,  acquired, bilateral    one dominant on R, Bosniak type II. (04/2016 CT)   Unstable gait    Urge incontinence     Past Surgical History:  Procedure Laterality Date   CATARACT EXTRACTION, BILATERAL     HOLEP-LASER ENUCLEATION OF THE PROSTATE WITH MORCELLATION N/A 11/18/2019   Path benign. Procedure: HOLEP-LASER ENUCLEATION OF THE PROSTATE WITH MORCELLATION;  Surgeon: Billey Co, MD;  Location: [facility] ORS;  Service: Urology;  Laterality: N/A;   TONSILLECTOMY  1940   VENTRAL HERNIA REPAIR  2010   with strangulation of SB (approx 1 foot of SB had to be excised).  Had 3 total surgeries for this due to complications    Outpatient Medications Prior to Visit  Medication Sig Dispense Refill   Cholecalciferol (VITAMIN D3 PO) Take 1,000 Units by mouth 3 (three) times a week.     doxazosin (CARDURA) 8 MG tablet Take 1 tablet (8 mg total) by mouth daily. 90 tablet 3   lisinopril (ZESTRIL) 40 MG tablet Take 1 tablet (40 mg total) by mouth daily. 90 tablet 3   metoprolol succinate (TOPROL-XL) 50 MG 24 hr tablet Take with or immediately following a meal. 90 tablet 3   cyclobenzaprine (FLEXERIL) 5 MG tablet 1 tab po bid prn neck pain (Patient not taking: Reported on 01/10/2021) 30 tablet 1   No facility-administered medications prior to visit.    Allergies  Allergen Reactions   Amlodipine Other (See Comments)    Disequilibrium, generalized weakness.    ROS As per HPI  PE: Vitals with BMI 06/26/2021 06/26/2021 03/14/2021  Height - 5\' 9"  -  Weight - 163 lbs 10 oz -  BMI - 17.51 -  Systolic 025 852 778  Diastolic 90 242 99  Pulse - 55 57  02 sat 93% on RA today Manual repeat blood pressure at end of visit today was 130/90.  Physical Exam  Gen: Alert, well appearing.  Patient is oriented to person, place, time, and situation. AFFECT: pleasant, lucid thought and speech. CV: RRR, no m/r/g.   LUNGS: CTA bilat, nonlabored resps, good aeration in all lung fields. EXT: no clubbing or  cyanosis.  no edema.    LABS:  Last CBC Lab Results  Component Value Date   WBC 12.3 (H) 08/10/2019   HGB 13.9 10/11/2020   HCT 47.4 08/10/2019   MCV 91.3 08/10/2019   MCH 31.8 08/10/2019   RDW 12.6 08/10/2019   PLT 156 08/10/2019  No results found for: IRON, TIBC, FERRITIN  Last metabolic panel Lab Results  Component Value Date   GLUCOSE 106 (H) 01/10/2021   NA 140 01/10/2021   K 4.3 01/10/2021   CL 105 01/10/2021   CO2 26 01/10/2021   BUN 37 (H) 01/10/2021   CREATININE 2.67 (H) 01/10/2021   GFRNONAA  31 01/10/2021   CALCIUM 9.0 01/10/2021   PHOS 2.7 08/10/2019   PROT 6.7 12/13/2019   ALBUMIN 4.0 08/14/2020   ALBUMIN 4.0 08/14/2020   BILITOT 0.8 12/13/2019   ALKPHOS 67 12/13/2019   AST 15 12/13/2019   ALT 9 12/13/2019   Last lipids Lab Results  Component Value Date   CHOL 178 07/12/2019   HDL 41.40 07/12/2019   LDLCALC 106 (H) 07/12/2019   TRIG 154.0 (H) 07/12/2019   CHOLHDL 4 07/12/2019   Last hemoglobin A1c Lab Results  Component Value Date   HGBA1C 5.5 09/27/2020   Last thyroid functions Lab Results  Component Value Date   TSH 1.86 12/13/2019   Last vitamin B12 and Folate Lab Results  Component Value Date   VITAMINB12 333 12/13/2019    IMPRESSION AND PLAN:  #1 hypertension, uncontrolled. Add clonidine 0.1 mg 3 times daily.  Continue to monitor blood pressure at home, return for recheck in 2 weeks. Cont toprol xl 50 qd, cardura 8mg  qd, and lisinopril 40 qd. Intolerant of amlodipine and hydralazine. BP med treatment options significantly limited due to chronic renal insufficiency and bradycardia. Electrolytes and creatinine today.  2.  Chronic renal insufficiency stage IV: Medical renal disease plus obstructive uropathy. GFR has been hovering around 20. He chooses to not return to nephrologist b/c he is firm in his decision to never be on dialysis. Electrolytes and creatinine today.  3.  Debilitated patient.  Energy level improving as he is  eating well and drinking well now. Continue to push activity with walker.  Some days he does need his motorized wheelchair.  An After Visit Summary was printed and given to the patient.  FOLLOW UP: Return in about 2 weeks (around 07/10/2021) for f/u bp.  Signed:  Crissie Sickles, MD           06/26/2021

## 2021-06-28 ENCOUNTER — Encounter: Payer: Self-pay | Admitting: Family Medicine

## 2021-06-28 ENCOUNTER — Telehealth: Payer: Self-pay | Admitting: Family Medicine

## 2021-06-28 NOTE — Telephone Encounter (Signed)
Pt wife called, said husband feels better now that he's taking the cloNIDine...however his rectum has been bleeding. Wife wants to know should he stop taking the medication. --KR   cloNIDine (CATAPRES) 0.1 MG tablet

## 2021-06-28 NOTE — Telephone Encounter (Signed)
See other encounter (pt message)

## 2021-06-28 NOTE — Telephone Encounter (Signed)
Pt wife called, said husband feels better now that he's taking the cloNIDine...however his rectum has been bleeding. Wife wants to know should he stop taking the medication. --KR     cloNIDine (CATAPRES) 0.1 MG tablet

## 2021-06-28 NOTE — Telephone Encounter (Signed)
Please review and advise.

## 2021-06-30 DIAGNOSIS — D649 Anemia, unspecified: Secondary | ICD-10-CM

## 2021-06-30 DIAGNOSIS — K922 Gastrointestinal hemorrhage, unspecified: Secondary | ICD-10-CM

## 2021-06-30 DIAGNOSIS — I255 Ischemic cardiomyopathy: Secondary | ICD-10-CM

## 2021-06-30 DIAGNOSIS — K8020 Calculus of gallbladder without cholecystitis without obstruction: Secondary | ICD-10-CM

## 2021-06-30 HISTORY — PX: TRANSTHORACIC ECHOCARDIOGRAM: SHX275

## 2021-06-30 HISTORY — DX: Gastrointestinal hemorrhage, unspecified: K92.2

## 2021-06-30 HISTORY — DX: Ischemic cardiomyopathy: I25.5

## 2021-06-30 HISTORY — DX: Anemia, unspecified: D64.9

## 2021-06-30 HISTORY — DX: Calculus of gallbladder without cholecystitis without obstruction: K80.20

## 2021-07-01 DIAGNOSIS — E861 Hypovolemia: Secondary | ICD-10-CM | POA: Diagnosis not present

## 2021-07-01 DIAGNOSIS — N401 Enlarged prostate with lower urinary tract symptoms: Secondary | ICD-10-CM | POA: Diagnosis not present

## 2021-07-01 DIAGNOSIS — Z7982 Long term (current) use of aspirin: Secondary | ICD-10-CM | POA: Diagnosis not present

## 2021-07-01 DIAGNOSIS — R5381 Other malaise: Secondary | ICD-10-CM | POA: Diagnosis not present

## 2021-07-01 DIAGNOSIS — D62 Acute posthemorrhagic anemia: Secondary | ICD-10-CM | POA: Diagnosis not present

## 2021-07-01 DIAGNOSIS — N200 Calculus of kidney: Secondary | ICD-10-CM | POA: Diagnosis not present

## 2021-07-01 DIAGNOSIS — K921 Melena: Secondary | ICD-10-CM | POA: Diagnosis not present

## 2021-07-01 DIAGNOSIS — R933 Abnormal findings on diagnostic imaging of other parts of digestive tract: Secondary | ICD-10-CM | POA: Diagnosis not present

## 2021-07-01 DIAGNOSIS — K5731 Diverticulosis of large intestine without perforation or abscess with bleeding: Secondary | ICD-10-CM | POA: Diagnosis not present

## 2021-07-01 DIAGNOSIS — Z79899 Other long term (current) drug therapy: Secondary | ICD-10-CM | POA: Diagnosis not present

## 2021-07-01 DIAGNOSIS — N390 Urinary tract infection, site not specified: Secondary | ICD-10-CM | POA: Diagnosis not present

## 2021-07-01 DIAGNOSIS — N281 Cyst of kidney, acquired: Secondary | ICD-10-CM | POA: Diagnosis not present

## 2021-07-01 DIAGNOSIS — R338 Other retention of urine: Secondary | ICD-10-CM | POA: Diagnosis not present

## 2021-07-01 DIAGNOSIS — Z888 Allergy status to other drugs, medicaments and biological substances status: Secondary | ICD-10-CM | POA: Diagnosis not present

## 2021-07-01 DIAGNOSIS — K5900 Constipation, unspecified: Secondary | ICD-10-CM | POA: Diagnosis not present

## 2021-07-01 DIAGNOSIS — I129 Hypertensive chronic kidney disease with stage 1 through stage 4 chronic kidney disease, or unspecified chronic kidney disease: Secondary | ICD-10-CM | POA: Diagnosis not present

## 2021-07-01 DIAGNOSIS — D696 Thrombocytopenia, unspecified: Secondary | ICD-10-CM | POA: Diagnosis not present

## 2021-07-01 DIAGNOSIS — K6389 Other specified diseases of intestine: Secondary | ICD-10-CM | POA: Diagnosis not present

## 2021-07-01 DIAGNOSIS — N184 Chronic kidney disease, stage 4 (severe): Secondary | ICD-10-CM | POA: Diagnosis not present

## 2021-07-01 DIAGNOSIS — N3289 Other specified disorders of bladder: Secondary | ICD-10-CM | POA: Diagnosis not present

## 2021-07-01 DIAGNOSIS — Z87440 Personal history of urinary (tract) infections: Secondary | ICD-10-CM | POA: Diagnosis not present

## 2021-07-01 DIAGNOSIS — Z87891 Personal history of nicotine dependence: Secondary | ICD-10-CM | POA: Diagnosis not present

## 2021-07-01 DIAGNOSIS — R140 Abdominal distension (gaseous): Secondary | ICD-10-CM | POA: Diagnosis not present

## 2021-07-01 DIAGNOSIS — K5730 Diverticulosis of large intestine without perforation or abscess without bleeding: Secondary | ICD-10-CM | POA: Diagnosis not present

## 2021-07-01 DIAGNOSIS — E869 Volume depletion, unspecified: Secondary | ICD-10-CM | POA: Diagnosis not present

## 2021-07-01 DIAGNOSIS — K625 Hemorrhage of anus and rectum: Secondary | ICD-10-CM | POA: Diagnosis not present

## 2021-07-01 DIAGNOSIS — I10 Essential (primary) hypertension: Secondary | ICD-10-CM | POA: Diagnosis not present

## 2021-07-01 DIAGNOSIS — E1122 Type 2 diabetes mellitus with diabetic chronic kidney disease: Secondary | ICD-10-CM | POA: Diagnosis not present

## 2021-07-01 DIAGNOSIS — R58 Hemorrhage, not elsewhere classified: Secondary | ICD-10-CM | POA: Diagnosis not present

## 2021-07-01 DIAGNOSIS — K922 Gastrointestinal hemorrhage, unspecified: Secondary | ICD-10-CM | POA: Diagnosis not present

## 2021-07-02 ENCOUNTER — Encounter: Payer: Self-pay | Admitting: Family Medicine

## 2021-07-02 DIAGNOSIS — R933 Abnormal findings on diagnostic imaging of other parts of digestive tract: Secondary | ICD-10-CM | POA: Diagnosis not present

## 2021-07-02 DIAGNOSIS — K625 Hemorrhage of anus and rectum: Secondary | ICD-10-CM | POA: Diagnosis not present

## 2021-07-02 DIAGNOSIS — D62 Acute posthemorrhagic anemia: Secondary | ICD-10-CM | POA: Diagnosis not present

## 2021-07-02 DIAGNOSIS — N184 Chronic kidney disease, stage 4 (severe): Secondary | ICD-10-CM | POA: Diagnosis not present

## 2021-07-03 DIAGNOSIS — E1122 Type 2 diabetes mellitus with diabetic chronic kidney disease: Secondary | ICD-10-CM | POA: Diagnosis not present

## 2021-07-03 DIAGNOSIS — K922 Gastrointestinal hemorrhage, unspecified: Secondary | ICD-10-CM | POA: Diagnosis not present

## 2021-07-03 DIAGNOSIS — K5730 Diverticulosis of large intestine without perforation or abscess without bleeding: Secondary | ICD-10-CM | POA: Diagnosis not present

## 2021-07-03 DIAGNOSIS — Z87891 Personal history of nicotine dependence: Secondary | ICD-10-CM | POA: Diagnosis not present

## 2021-07-03 DIAGNOSIS — I129 Hypertensive chronic kidney disease with stage 1 through stage 4 chronic kidney disease, or unspecified chronic kidney disease: Secondary | ICD-10-CM | POA: Diagnosis not present

## 2021-07-03 DIAGNOSIS — Z888 Allergy status to other drugs, medicaments and biological substances status: Secondary | ICD-10-CM | POA: Diagnosis not present

## 2021-07-03 DIAGNOSIS — R933 Abnormal findings on diagnostic imaging of other parts of digestive tract: Secondary | ICD-10-CM | POA: Diagnosis not present

## 2021-07-03 DIAGNOSIS — N184 Chronic kidney disease, stage 4 (severe): Secondary | ICD-10-CM | POA: Diagnosis not present

## 2021-07-03 DIAGNOSIS — K625 Hemorrhage of anus and rectum: Secondary | ICD-10-CM | POA: Diagnosis not present

## 2021-07-03 DIAGNOSIS — D62 Acute posthemorrhagic anemia: Secondary | ICD-10-CM | POA: Diagnosis not present

## 2021-07-03 NOTE — Telephone Encounter (Signed)
Fiber in the form of metamucil powder OR in a gummy is fine.

## 2021-07-03 NOTE — Telephone Encounter (Signed)
Please review and advise.

## 2021-07-04 ENCOUNTER — Telehealth: Payer: Self-pay | Admitting: Family Medicine

## 2021-07-04 ENCOUNTER — Encounter: Payer: Self-pay | Admitting: Family Medicine

## 2021-07-04 DIAGNOSIS — K625 Hemorrhage of anus and rectum: Secondary | ICD-10-CM | POA: Diagnosis not present

## 2021-07-04 DIAGNOSIS — K5900 Constipation, unspecified: Secondary | ICD-10-CM | POA: Insufficient documentation

## 2021-07-04 NOTE — Telephone Encounter (Signed)
Pt wife called , said husband is in hospital. Nurse will be giving pt CIPRO. Wife wanted to know is it okay for pt to use.  Informed wife that she can speak to the nurses about the CIPRO.--KR

## 2021-07-05 DIAGNOSIS — R4182 Altered mental status, unspecified: Secondary | ICD-10-CM | POA: Diagnosis not present

## 2021-07-05 DIAGNOSIS — Z888 Allergy status to other drugs, medicaments and biological substances status: Secondary | ICD-10-CM | POA: Diagnosis not present

## 2021-07-05 DIAGNOSIS — E1122 Type 2 diabetes mellitus with diabetic chronic kidney disease: Secondary | ICD-10-CM | POA: Diagnosis not present

## 2021-07-05 DIAGNOSIS — Z8719 Personal history of other diseases of the digestive system: Secondary | ICD-10-CM | POA: Diagnosis not present

## 2021-07-05 DIAGNOSIS — R55 Syncope and collapse: Secondary | ICD-10-CM | POA: Diagnosis not present

## 2021-07-05 DIAGNOSIS — I255 Ischemic cardiomyopathy: Secondary | ICD-10-CM | POA: Diagnosis not present

## 2021-07-05 DIAGNOSIS — I214 Non-ST elevation (NSTEMI) myocardial infarction: Secondary | ICD-10-CM | POA: Diagnosis not present

## 2021-07-05 DIAGNOSIS — R531 Weakness: Secondary | ICD-10-CM | POA: Diagnosis not present

## 2021-07-05 DIAGNOSIS — Z79899 Other long term (current) drug therapy: Secondary | ICD-10-CM | POA: Diagnosis not present

## 2021-07-05 DIAGNOSIS — I77810 Thoracic aortic ectasia: Secondary | ICD-10-CM | POA: Diagnosis not present

## 2021-07-05 DIAGNOSIS — I5043 Acute on chronic combined systolic (congestive) and diastolic (congestive) heart failure: Secondary | ICD-10-CM | POA: Diagnosis not present

## 2021-07-05 DIAGNOSIS — D649 Anemia, unspecified: Secondary | ICD-10-CM | POA: Insufficient documentation

## 2021-07-05 DIAGNOSIS — I959 Hypotension, unspecified: Secondary | ICD-10-CM | POA: Diagnosis not present

## 2021-07-05 DIAGNOSIS — R7989 Other specified abnormal findings of blood chemistry: Secondary | ICD-10-CM | POA: Diagnosis not present

## 2021-07-05 DIAGNOSIS — I10 Essential (primary) hypertension: Secondary | ICD-10-CM | POA: Diagnosis not present

## 2021-07-05 DIAGNOSIS — I519 Heart disease, unspecified: Secondary | ICD-10-CM | POA: Diagnosis not present

## 2021-07-05 DIAGNOSIS — F03B0 Unspecified dementia, moderate, without behavioral disturbance, psychotic disturbance, mood disturbance, and anxiety: Secondary | ICD-10-CM | POA: Diagnosis not present

## 2021-07-05 DIAGNOSIS — I5042 Chronic combined systolic (congestive) and diastolic (congestive) heart failure: Secondary | ICD-10-CM | POA: Diagnosis not present

## 2021-07-05 DIAGNOSIS — R778 Other specified abnormalities of plasma proteins: Secondary | ICD-10-CM | POA: Diagnosis not present

## 2021-07-05 DIAGNOSIS — Z87891 Personal history of nicotine dependence: Secondary | ICD-10-CM | POA: Diagnosis not present

## 2021-07-05 DIAGNOSIS — R9431 Abnormal electrocardiogram [ECG] [EKG]: Secondary | ICD-10-CM | POA: Diagnosis not present

## 2021-07-05 DIAGNOSIS — I082 Rheumatic disorders of both aortic and tricuspid valves: Secondary | ICD-10-CM | POA: Diagnosis not present

## 2021-07-05 DIAGNOSIS — N184 Chronic kidney disease, stage 4 (severe): Secondary | ICD-10-CM | POA: Diagnosis not present

## 2021-07-05 DIAGNOSIS — E875 Hyperkalemia: Secondary | ICD-10-CM | POA: Diagnosis not present

## 2021-07-05 DIAGNOSIS — R58 Hemorrhage, not elsewhere classified: Secondary | ICD-10-CM | POA: Diagnosis not present

## 2021-07-05 DIAGNOSIS — I130 Hypertensive heart and chronic kidney disease with heart failure and stage 1 through stage 4 chronic kidney disease, or unspecified chronic kidney disease: Secondary | ICD-10-CM | POA: Diagnosis not present

## 2021-07-05 DIAGNOSIS — Z743 Need for continuous supervision: Secondary | ICD-10-CM | POA: Diagnosis not present

## 2021-07-05 DIAGNOSIS — R4020 Unspecified coma: Secondary | ICD-10-CM | POA: Diagnosis not present

## 2021-07-05 NOTE — Telephone Encounter (Signed)
FYI

## 2021-07-05 NOTE — Telephone Encounter (Signed)
OK, noted

## 2021-07-06 DIAGNOSIS — I519 Heart disease, unspecified: Secondary | ICD-10-CM | POA: Diagnosis not present

## 2021-07-06 DIAGNOSIS — I5042 Chronic combined systolic (congestive) and diastolic (congestive) heart failure: Secondary | ICD-10-CM | POA: Insufficient documentation

## 2021-07-06 DIAGNOSIS — I082 Rheumatic disorders of both aortic and tricuspid valves: Secondary | ICD-10-CM | POA: Diagnosis not present

## 2021-07-06 DIAGNOSIS — R9431 Abnormal electrocardiogram [ECG] [EKG]: Secondary | ICD-10-CM | POA: Diagnosis not present

## 2021-07-06 DIAGNOSIS — I77810 Thoracic aortic ectasia: Secondary | ICD-10-CM | POA: Diagnosis not present

## 2021-07-07 DIAGNOSIS — I5042 Chronic combined systolic (congestive) and diastolic (congestive) heart failure: Secondary | ICD-10-CM | POA: Diagnosis not present

## 2021-07-07 DIAGNOSIS — N184 Chronic kidney disease, stage 4 (severe): Secondary | ICD-10-CM | POA: Diagnosis not present

## 2021-07-07 DIAGNOSIS — R9431 Abnormal electrocardiogram [ECG] [EKG]: Secondary | ICD-10-CM | POA: Diagnosis not present

## 2021-07-07 DIAGNOSIS — I10 Essential (primary) hypertension: Secondary | ICD-10-CM | POA: Diagnosis not present

## 2021-07-07 DIAGNOSIS — E875 Hyperkalemia: Secondary | ICD-10-CM | POA: Diagnosis not present

## 2021-07-07 DIAGNOSIS — Z8719 Personal history of other diseases of the digestive system: Secondary | ICD-10-CM | POA: Diagnosis not present

## 2021-07-07 DIAGNOSIS — D649 Anemia, unspecified: Secondary | ICD-10-CM | POA: Diagnosis not present

## 2021-07-07 DIAGNOSIS — R778 Other specified abnormalities of plasma proteins: Secondary | ICD-10-CM | POA: Diagnosis not present

## 2021-07-07 DIAGNOSIS — R55 Syncope and collapse: Secondary | ICD-10-CM | POA: Diagnosis not present

## 2021-07-16 ENCOUNTER — Other Ambulatory Visit: Payer: Self-pay

## 2021-07-17 ENCOUNTER — Encounter: Payer: Self-pay | Admitting: Family Medicine

## 2021-07-17 ENCOUNTER — Ambulatory Visit (INDEPENDENT_AMBULATORY_CARE_PROVIDER_SITE_OTHER): Payer: Medicare Other | Admitting: Family Medicine

## 2021-07-17 ENCOUNTER — Other Ambulatory Visit: Payer: Self-pay

## 2021-07-17 VITALS — BP 130/68 | HR 56 | Temp 98.0°F | Ht 69.0 in | Wt 165.8 lb

## 2021-07-17 DIAGNOSIS — I5042 Chronic combined systolic (congestive) and diastolic (congestive) heart failure: Secondary | ICD-10-CM

## 2021-07-17 DIAGNOSIS — N184 Chronic kidney disease, stage 4 (severe): Secondary | ICD-10-CM

## 2021-07-17 DIAGNOSIS — I10 Essential (primary) hypertension: Secondary | ICD-10-CM

## 2021-07-17 DIAGNOSIS — D500 Iron deficiency anemia secondary to blood loss (chronic): Secondary | ICD-10-CM

## 2021-07-17 MED ORDER — ISOSORBIDE MONONITRATE ER 30 MG PO TB24
30.0000 mg | ORAL_TABLET | Freq: Every day | ORAL | 3 refills | Status: DC
Start: 2021-07-17 — End: 2021-09-06

## 2021-07-17 NOTE — Progress Notes (Signed)
07/17/2021  CC:  Chief Complaint  Patient presents with   Hospitalization Follow-up    Patient is a 88 y.o. Caucasian male who presents accompanied by his wife Gary Morgan for hospital follow up. Dates hospitalized: 1/6-1/8, 2023. Days since d/c from hospital: 10 days Patient was discharged from hospital to home. Reason for admission to hospital: AMS; also had recent admission to [facility] for rectal bleeding and had just been d/c'd 07/04/21.   I have reviewed patient's discharge summary plus pertinent specific notes, labs, and imaging from the hospitalization.   Patient apparently was in usual state of health and then at breakfast had a spell of being unresponsive to voice and very sleepy, hard to arouse. He did have elevated troponins: 392--406--411.  Chest x-ray--negative acute.  Echocardiogram showed diffuse LV hypokinesis, EF 40 to 45%.  No significant valvular regurgitation or stenosis. His cardiologist, Dr. Jac Canavan, suspects ischemic cardiomyopathy.  Not a cath candidate due to chronic renal insufficiency stage IV.  No stress testing was done because this was unlikely to change management. Additionally, admission potassium was 5.5 and hemoglobin 9.3.  He did have documented lower GI bleed just prior to this admission, colonoscopy reportedly showed no active bleeding, cause of bleeding presumed to be diverticular.  No transfusion was required. Creatinine on admission 2.79.  It was noted that his admission UA was abnormal and he was started on Cipro.  However he did not tolerate Cipro so it was stopped and subsequent UA was normal so no further antibiotics were given.  MED CHANGES THIS HOSPITALIZATION: His clonidine and lisinopril were discontinued.  He was started on Imdur 30 mg a day and Toprol-XL 50 mg a day.  Also on Cardura 8 mg a day.  ASA 81 mg started.  CURRENTLY:  Feeling much better. Just started otc iron and tolerating so far w/out problem---FeSo4 325. Home bp's have  been good. No further rectal bleeding.  Status has been clear.  Appetite is great. Blood pressures have been within normal range.  Medication reconciliation was done today and patient is taking meds as recommended by discharging hospitalist/specialist.    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 Dois Davenport (urol).  Dr. Jeffie Pollock suspects this was due to stretch injury and has resolved as of 09/24/16.   Arthritis of right wrist    s/p fracture of middle third of scaphoid bone sustained cleaning up from [facility] injection by Dr. Napoleon Form in [place] (ortho) made the pain go away.   BPH with obstruction/lower urinary tract symptoms    hx of urinary retention (s/p foley cath when in hosp for urosepsis 04/2016).  +Areflexic bladder.  TURP will not help since he has acontractile bladder.  Options are CIC, indwelling urethral foley, or suprapubic catheter, HOLEP or simple prostatectomy.  HOLEP done 10/2019.   Cholelithiasis 06/2021   w/out cholecystitis   Chronic low back pain    Chronic renal insufficiency, stage 4 (severe) (HCC)    CrCl 30s (saw nephrologist in [place]). +microalbuminuria 04/2014 at PCP in [place].  Stage IV 2018: GFR upper 20s. Obstruc urop contrib, bilat hydroureteroneph->HoLEP 11/2019. Recur urin retent, GFR dec to about 20->to nephrol 2022.   COPD (chronic obstructive pulmonary disease) ([place])    on CXR 09/27/2015   Debilitated patient    Diabetes mellitus ([place])    2015 HbA1c 6.2-6.3 %.  HbA1c 06/2016 5.4%.  A1c 6.5% 05/2017 (this was his first A1c of 6.5% or  more)--nutritionist referral recommended.   Diverticulosis 04/2016   Noted on CT 04/2016   Elevated PSA 06/2016   Dr. Redmond Pulling ([facility] urologist)-awaiting records (pt's wife reported pt's psa was 7).  Dr. Jeffie Pollock feels like this may have been a result of relatively recent episode of urinary retention, recheck PSA 12/29/16 was lower: 4.64, 24%  f/t ratio, plan recheck 1 yr (Dr. Jeffie Pollock).    GI bleed 06/2021   colonic   History of anemia due to chronic kidney disease    History of pyelonephritis 04/2016   History of sepsis 04/2016   e coli   Hypertension    Kidney stones    one episode   Recurrent UTI 2017   Renal cysts, acquired, bilateral    one dominant on R, Bosniak type II. (04/2016 CT)   Unstable gait    Urge incontinence     PSH:  Past Surgical History:  Procedure Laterality Date   CATARACT EXTRACTION, BILATERAL     HOLEP-LASER ENUCLEATION OF THE PROSTATE WITH MORCELLATION N/A 11/18/2019   Path benign. Procedure: HOLEP-LASER ENUCLEATION OF THE PROSTATE WITH MORCELLATION;  Surgeon: Billey Co, MD;  Location: [facility] ORS;  Service: Urology;  Laterality: N/A;   TONSILLECTOMY  1940   VENTRAL HERNIA REPAIR  2010   with strangulation of SB (approx 1 foot of SB had to be excised).  Had 3 total surgeries for this due to complications    MEDS:  Outpatient Medications Prior to Visit  Medication Sig Dispense Refill   aspirin 81 MG EC tablet Take by mouth.     Cholecalciferol (VITAMIN D3 PO) Take 1,000 Units by mouth 3 (three) times a week.     doxazosin (CARDURA) 8 MG tablet Take 1 tablet (8 mg total) by mouth daily. 90 tablet 3   Ferrous Sulfate (IRON PO) Take 65 mg by mouth.     isosorbide mononitrate (IMDUR) 30 MG 24 hr tablet Take by mouth.     metoprolol succinate (TOPROL-XL) 50 MG 24 hr tablet Take with or immediately following a meal. 90 tablet 3   polyethylene glycol powder (GLYCOLAX/MIRALAX) 17 GM/SCOOP powder Take by mouth.     cyclobenzaprine (FLEXERIL) 5 MG tablet 1 tab po bid prn neck pain (Patient not taking: Reported on 01/10/2021) 30 tablet 1   cloNIDine (CATAPRES) 0.1 MG tablet Take 1 tablet (0.1 mg total) by mouth 3 (three) times daily. 90 tablet 1   lisinopril (ZESTRIL) 40 MG tablet Take 1 tablet (40 mg total) by mouth daily. 90 tablet 3   No facility-administered medications prior to visit.     Physical Exam    Pertinent labs/imaging Last CBC Lab Results  Component Value Date   WBC 12.3 (H) 08/10/2019   HGB 13.9 10/11/2020   HCT 47.4 08/10/2019   MCV 91.3 08/10/2019   MCH 31.8 08/10/2019   RDW 12.6 08/10/2019   PLT 156 64/40/3474   Last metabolic panel Lab Results  Component Value Date   GLUCOSE 86 06/26/2021   NA 141 06/26/2021   K 4.4 06/26/2021   CL 104 06/26/2021   CO2 30 06/26/2021   BUN 31 (H) 06/26/2021   CREATININE 2.48 (H) 06/26/2021   GFRNONAA 31 01/10/2021   CALCIUM 8.9 06/26/2021   PHOS 2.7 08/10/2019   PROT 6.7 12/13/2019   ALBUMIN 4.0 08/14/2020   ALBUMIN 4.0 08/14/2020   BILITOT 0.8 12/13/2019   ALKPHOS 67 12/13/2019   AST 15 12/13/2019   ALT 9 12/13/2019   Labs for this hospitalization:  Recent Labs  Lab 07/01/21 2341 07/02/21 0322 07/02/21 1747 07/03/21 0640 07/04/21 0705 07/05/21 1059 07/05/21 2033 07/06/21 0227 07/07/21 0652  WBC 7.1 -- -- 6.8 -- 7.1 8.8 8.3 7.3  HGB 12.9* < > 10.2* 9.9* 9.3* 9.3* 9.6* 9.2* 8.8*  HCT 40.3* < > 30.9* 31.0* 29.5* 28.6* 29.0* 27.7* 26.7*  PLT 148* -- -- 130* -- 132* 135* 142* 145*  < > = values in this interval not displayed.   Recent Labs  Lab 07/01/21 2341 07/05/21 1059 07/06/21 0227 07/07/21 0652  NA 144 143 142 143  K 4.6 5.5* 4.5 4.4  CL 108 109* 109* 108  CO2 25 26 28 28   BUN 38* 31* 31* 29*  CREATININE 2.70* 2.79* 2.82* 2.83*  CALCIUM 9.0 9.3 9.0 8.8  PHOS -- 3.1 -- --  ASSESSMENT/PLAN:  #1 ischemic cardiomyopathy, recently exacerbated by acute anemia from rectal bleeding. Cardiology has seen him in the hospital, recommended against cath due to his chronic renal insufficiency.  Stress test was considered but not done because the outcome would not of changed management.  Blood pressure is good and patient without chest pain or shortness of breath on Imdur 30 mg a day, Toprol-XL 50 mg/day, and Cardura 8 mg/day.  Continue aspirin 81 mg a day. [name] and his wife prefer to hold  off on any follow-up with cardiology at this time. In the near future I will discuss starting statin with him.  2.  Chronic renal insufficiency stage IV. Obstructive uropathy plus medical renal disease. Lisinopril was recently stopped in the hospital due to hyperkalemia. Electrolytes and creatinine today.  3.  Anemia due to acute lower GI blood loss. This is superimposed on anemia of chronic renal insufficiency. Colonoscopy this month during the hospitalization for rectal bleeding showed no active bleeding.  Presumed diverticular bleed.  No further bleeding noted. He has started 325 mg ferrous sulfate daily in the last week. Rechecking CBC today.  Iron panel has not been checked since he has developed the acute anemia.  Check iron panel today.  FOLLOW UP:  4 wks f/u anemia/htn/cri  Signed:  Crissie Sickles, MD           07/17/2021

## 2021-07-17 NOTE — Telephone Encounter (Signed)
Pt had appt today. Discussed with PCP

## 2021-07-18 ENCOUNTER — Encounter: Payer: Self-pay | Admitting: Family Medicine

## 2021-07-18 LAB — BASIC METABOLIC PANEL
BUN: 32 mg/dL — ABNORMAL HIGH (ref 6–23)
CO2: 27 mEq/L (ref 19–32)
Calcium: 8.5 mg/dL (ref 8.4–10.5)
Chloride: 104 mEq/L (ref 96–112)
Creatinine, Ser: 2.83 mg/dL — ABNORMAL HIGH (ref 0.40–1.50)
GFR: 19.44 mL/min — ABNORMAL LOW (ref >60.00)
Glucose, Bld: 91 mg/dL (ref 70–99)
Potassium: 4.4 mEq/L (ref 3.5–5.1)
Sodium: 140 mEq/L (ref 135–145)

## 2021-07-18 LAB — CBC WITH DIFFERENTIAL/PLATELET
Basophils Absolute: 0 10*3/uL (ref 0.0–0.1)
Basophils Relative: 0.5 % (ref 0.0–3.0)
Eosinophils Absolute: 0.4 10*3/uL (ref 0.0–0.7)
Eosinophils Relative: 5.6 % — ABNORMAL HIGH (ref 0.0–5.0)
HCT: 30.5 % — ABNORMAL LOW (ref 39.0–52.0)
Hemoglobin: 9.9 g/dL — ABNORMAL LOW (ref 13.0–17.0)
Lymphocytes Relative: 22.6 % (ref 12.0–46.0)
Lymphs Abs: 1.7 10*3/uL (ref 0.7–4.0)
MCHC: 32.5 g/dL (ref 30.0–36.0)
MCV: 97.1 fl (ref 78.0–100.0)
Monocytes Absolute: 0.4 10*3/uL (ref 0.1–1.0)
Monocytes Relative: 5.3 % (ref 3.0–12.0)
Neutro Abs: 4.9 10*3/uL (ref 1.4–7.7)
Neutrophils Relative %: 66 % (ref 43.0–77.0)
Platelets: 157 10*3/uL (ref 150.0–400.0)
RBC: 3.14 Mil/uL — ABNORMAL LOW (ref 4.22–5.81)
RDW: 15.7 % — ABNORMAL HIGH (ref 11.5–15.5)
WBC: 7.5 10*3/uL (ref 4.0–10.5)

## 2021-07-18 LAB — IRON,TIBC AND FERRITIN PANEL
%SAT: 9 % (calc) — ABNORMAL LOW (ref 20–48)
Ferritin: 49 ng/mL (ref 24–380)
Iron: 25 ug/dL — ABNORMAL LOW (ref 50–180)
TIBC: 285 mcg/dL (calc) (ref 250–425)

## 2021-07-19 MED ORDER — ASPIRIN 81 MG PO TBEC: 81.0000 mg | DELAYED_RELEASE_TABLET | Freq: Every day | ORAL | 3 refills | Status: AC

## 2021-07-19 NOTE — Telephone Encounter (Signed)
Pt's wife advised refill sent.

## 2021-07-19 NOTE — Telephone Encounter (Signed)
Pt requesting refill for ASA. Please fill, if appropriate.

## 2021-07-19 NOTE — Telephone Encounter (Signed)
Aspirin sent in

## 2021-07-26 ENCOUNTER — Encounter: Payer: Self-pay | Admitting: Family Medicine

## 2021-08-06 NOTE — Telephone Encounter (Signed)
No action required.

## 2021-08-16 ENCOUNTER — Ambulatory Visit: Payer: Medicare Other | Admitting: Family Medicine

## 2021-08-22 ENCOUNTER — Encounter: Payer: Self-pay | Admitting: Family Medicine

## 2021-08-22 NOTE — Telephone Encounter (Signed)
FYI

## 2021-08-22 NOTE — Telephone Encounter (Signed)
Noted  

## 2021-09-06 ENCOUNTER — Telehealth: Payer: Self-pay

## 2021-09-06 ENCOUNTER — Encounter: Payer: Self-pay | Admitting: Family Medicine

## 2021-09-06 ENCOUNTER — Other Ambulatory Visit: Payer: Self-pay

## 2021-09-06 ENCOUNTER — Ambulatory Visit (INDEPENDENT_AMBULATORY_CARE_PROVIDER_SITE_OTHER): Payer: Medicare Other | Admitting: Family Medicine

## 2021-09-06 VITALS — BP 131/71 | HR 56 | Temp 98.0°F | Ht 69.0 in | Wt 168.2 lb

## 2021-09-06 DIAGNOSIS — N184 Chronic kidney disease, stage 4 (severe): Secondary | ICD-10-CM

## 2021-09-06 DIAGNOSIS — D649 Anemia, unspecified: Secondary | ICD-10-CM

## 2021-09-06 DIAGNOSIS — I10 Essential (primary) hypertension: Secondary | ICD-10-CM

## 2021-09-06 DIAGNOSIS — R5381 Other malaise: Secondary | ICD-10-CM

## 2021-09-06 DIAGNOSIS — I255 Ischemic cardiomyopathy: Secondary | ICD-10-CM

## 2021-09-06 DIAGNOSIS — N2889 Other specified disorders of kidney and ureter: Secondary | ICD-10-CM

## 2021-09-06 LAB — CBC
HCT: 35.7 % — ABNORMAL LOW (ref 39.0–52.0)
Hemoglobin: 11.9 g/dL — ABNORMAL LOW (ref 13.0–17.0)
MCHC: 33.3 g/dL (ref 30.0–36.0)
MCV: 95.9 fl (ref 78.0–100.0)
Platelets: 141 10*3/uL — ABNORMAL LOW (ref 150.0–400.0)
RBC: 3.72 Mil/uL — ABNORMAL LOW (ref 4.22–5.81)
RDW: 14.1 % (ref 11.5–15.5)
WBC: 6.5 10*3/uL (ref 4.0–10.5)

## 2021-09-06 LAB — BASIC METABOLIC PANEL
BUN: 39 mg/dL — ABNORMAL HIGH (ref 6–23)
CO2: 27 mEq/L (ref 19–32)
Calcium: 8.8 mg/dL (ref 8.4–10.5)
Chloride: 105 mEq/L (ref 96–112)
Creatinine, Ser: 2.54 mg/dL — ABNORMAL HIGH (ref 0.40–1.50)
GFR: 22.11 mL/min — ABNORMAL LOW (ref >60.00)
Glucose, Bld: 107 mg/dL — ABNORMAL HIGH (ref 70–99)
Potassium: 4.3 mEq/L (ref 3.5–5.1)
Sodium: 141 mEq/L (ref 135–145)

## 2021-09-06 LAB — VITAMIN B12: Vitamin B-12: 225 pg/mL (ref 211–911)

## 2021-09-06 MED ORDER — ISOSORBIDE MONONITRATE ER 60 MG PO TB24: 60.0000 mg | ORAL_TABLET | Freq: Every day | ORAL | 1 refills | Status: DC

## 2021-09-06 NOTE — Progress Notes (Signed)
OFFICE VISIT  09/06/2021  CC:  Chief Complaint  Patient presents with   Chronic Kidney Disease   Anemia   Hypertension   HPI:    Patient is a 88 y.o. male who presents accompanied by his daughter for 6-week follow-up debilitated state and recently found to have likely ischemic cardiomyopathy.  He has chronic renal insufficiency stage IV as well as recent blood loss anemia suspected to have been due to diverticular bleed. A/P as of last visit: "#1 ischemic cardiomyopathy, recently exacerbated by acute anemia from rectal bleeding. Cardiology has seen him in the hospital, recommended against cath due to his chronic renal insufficiency.  Stress test was considered but not done because the outcome would not of changed management.  Blood pressure is good and patient without chest pain or shortness of breath on Imdur 30 mg a day, Toprol-XL 50 mg/day, and Cardura 8 mg/day.  Continue aspirin 81 mg a day. [name] and his wife prefer to hold off on any follow-up with cardiology at this time. In the near future I will discuss starting statin with him.  2.  Chronic renal insufficiency stage IV. Obstructive uropathy plus medical renal disease. Lisinopril was recently stopped in the hospital due to hyperkalemia. Electrolytes and creatinine today.  3.  Anemia due to acute lower GI blood loss. This is superimposed on anemia of chronic renal insufficiency. Colonoscopy this month during the hospitalization for rectal bleeding showed no active bleeding.  Presumed diverticular bleed.  No further bleeding noted. He has started 325 mg ferrous sulfate daily in the last week. Rechecking CBC today.  Iron panel has not been checked since he has developed the acute anemia.  Check iron panel today."  INTERIM HX: He is gradually regaining energy.  He uses wheelchair mostly but does get up and walk with a walker some.  He even has not enough energy sometimes to walk without the walker and hold onto the walls. He did  fall earlier this week when he caught his toes on something on the floor.  He was able to get himself up and denies any pain.  Reviewed home blood pressure monitoring numbers today: Consistently elevated, systolics average 846, diastolics averaging 96-295.  Rarely any readings down around 130/80.  Heart rate consistently 50s and 60s.  He denies any dizziness, denies chest pain, denies shortness of breath.  No change in lower extremities or edema recently.  He has chronic incontinence, particularly nocturia.  ROS as above, plus--> no fevers, no wheezing, no cough,  no HAs, no rashes, no melena/hematochezia.  No polyuria or polydipsia.  No myalgias or arthralgias.  No focal weakness, paresthesias, or tremors.  No acute vision or hearing abnormalities.  No dysuria  No n/v/d or abd pain.  No palpitations.    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 Dois Davenport (urol).  Dr. Jeffie Pollock suspects this was due to stretch injury and has resolved as of 09/24/16.   Anemia 06/2021   blood loss (lower GI bleed) + anemia of chronic renal insufficiency   Arthritis of right wrist    s/p fracture of middle third of scaphoid bone sustained cleaning up from [facility] injection by Dr. Napoleon Form in [place] (ortho) made the pain go away.   BPH with obstruction/lower urinary tract symptoms    hx of urinary retention (s/p foley cath when in hosp for urosepsis 04/2016).  +Areflexic bladder.  TURP will not help since he has acontractile bladder.  Options are CIC, indwelling urethral foley, or suprapubic catheter, HOLEP or simple prostatectomy.  HOLEP done 10/2019.   Cholelithiasis 06/2021   w/out cholecystitis   Chronic low back pain    Chronic renal insufficiency, stage 4 (severe) (HCC)    CrCl 30s (saw nephrologist in [place]). +microalbuminuria 04/2014 at PCP in [place].  Stage IV 2018: GFR upper 20s. Obstruc urop contrib, bilat  hydroureteroneph->HoLEP 11/2019. Recur urin retent, GFR dec to about 20->to nephrol 2022.   COPD (chronic obstructive pulmonary disease) ([place])    on CXR 09/27/2015   Debilitated patient    Diabetes mellitus ([place])    2015 HbA1c 6.2-6.3 %.  HbA1c 06/2016 5.4%.  A1c 6.5% 05/2017 (this was his first A1c of 6.5% or more)--nutritionist referral recommended.   Diverticulosis 04/2016   Noted on CT 04/2016   Elevated PSA 06/2016   Dr. Redmond Pulling ([facility] urologist)-awaiting records (pt's wife reported pt's psa was 7).  Dr. Jeffie Pollock feels like this may have been a result of relatively recent episode of urinary retention, recheck PSA 12/29/16 was lower: 4.64, 24% f/t ratio, plan recheck 1 yr (Dr. Jeffie Pollock).    GI bleed 06/2021   colonic   History of anemia due to chronic kidney disease    History of pyelonephritis 04/2016   History of sepsis 04/2016   e coli   Hypertension    Ischemic cardiomyopathy 2023   Patient is a poor cath candidate due to significant chronic renal insufficiency   Kidney stones    one episode   Recurrent UTI 2017   Renal cysts, acquired, bilateral    one dominant on R, Bosniak type II. (04/2016 CT)   Unstable gait    Urge incontinence     Past Surgical History:  Procedure Laterality Date   CATARACT EXTRACTION, BILATERAL     HOLEP-LASER ENUCLEATION OF THE PROSTATE WITH MORCELLATION N/A 11/18/2019   Path benign. Procedure: HOLEP-LASER ENUCLEATION OF THE PROSTATE WITH MORCELLATION;  Surgeon: Billey Co, MD;  Location: [facility] ORS;  Service: Urology;  Laterality: N/A;   TONSILLECTOMY  1940   TRANSTHORACIC ECHOCARDIOGRAM  06/2021   EF 40-45%, diffuse LV hypokinesis   VENTRAL HERNIA REPAIR  2010   with strangulation of SB (approx 1 foot of SB had to be excised).  Had 3 total surgeries for this due to complications    Outpatient Medications Prior to Visit  Medication Sig Dispense Refill   aspirin EC 81 MG tablet Take 81 mg by mouth daily. Swallow whole.     Cholecalciferol (VITAMIN  D3 PO) Take 1,000 Units by mouth 3 (three) times a week.     doxazosin (CARDURA) 8 MG tablet Take 1 tablet (8 mg total) by mouth daily. 90 tablet 3   ferrous sulfate 325 (65 FE) MG tablet Take 325 mg by mouth daily.     metoprolol succinate (TOPROL-XL) 50 MG 24 hr tablet Take with or immediately following a meal. 90 tablet 3   Ferrous Sulfate (IRON PO) Take 65 mg by mouth.     isosorbide mononitrate (IMDUR) 30 MG 24 hr tablet Take 1 tablet (30 mg total) by mouth daily. 90 tablet 3   No facility-administered medications prior to visit.    Allergies  Allergen Reactions   Amlodipine Other (See Comments)    Disequilibrium, generalized weakness.    ROS As per HPI  PE: Vitals with BMI 09/06/2021 07/17/2021 06/26/2021  Height 5\' 9"  5\' 9"  -  Weight 168 lbs 3 oz 165 lbs 13 oz -  BMI 96.29 52.84 -  Systolic 132 440 102  Diastolic 71 68 90  Pulse 56 56 -  02 sat 91% RA  Physical Exam  General: Alert, sitting up in wheelchair and looks well. Affect is pleasant, lucid thought and speech. Cardiovascular: Regular rhythm and rate without murmur rub or gallop. Lungs are clear bilaterally, nonlabored respirations. Extremities show no edema  LABS:  Last CBC Lab Results  Component Value Date   WBC 7.5 07/17/2021   HGB 9.9 (L) 07/17/2021   HCT 30.5 (L) 07/17/2021   MCV 97.1 07/17/2021   MCH 31.8 08/10/2019   RDW 15.7 (H) 07/17/2021   PLT 157.0 07/17/2021   Lab Results  Component Value Date   IRON 25 (L) 07/17/2021   TIBC 285 07/17/2021   FERRITIN 49 07/17/2021   Lab Results  Component Value Date   VITAMINB12 333 72/53/6644   Last metabolic panel Lab Results  Component Value Date   GLUCOSE 91 07/17/2021   NA 140 07/17/2021   K 4.4 07/17/2021   CL 104 07/17/2021   CO2 27 07/17/2021   BUN 32 (H) 07/17/2021   CREATININE 2.83 (H) 07/17/2021   GFRNONAA 31 01/10/2021   CALCIUM 8.5 07/17/2021   PHOS 2.7 08/10/2019   PROT 6.7 12/13/2019   ALBUMIN 4.0 08/14/2020   ALBUMIN 4.0  08/14/2020   BILITOT 0.8 12/13/2019   ALKPHOS 67 12/13/2019   AST 15 12/13/2019   ALT 9 12/13/2019   Last lipids Lab Results  Component Value Date   CHOL 178 07/12/2019   HDL 41.40 07/12/2019   LDLCALC 106 (H) 07/12/2019   TRIG 154.0 (H) 07/12/2019   CHOLHDL 4 07/12/2019   Last hemoglobin A1c Lab Results  Component Value Date   HGBA1C 5.9 06/26/2021   Last thyroid functions Lab Results  Component Value Date   TSH 1.86 12/13/2019   IMPRESSION AND PLAN:  #1 uncontrolled hypertension.  He has always had labile hypertension and this has worsened as his renal function has worsened. Various blood pressure medication intolerances plus severe chronic renal insufficiency Levonest no options at this time other than a trial of increasing his Imdur to see if we get any blood pressure lowering effect.  Increase Imdur to 60 mg a day today. Continue Cardura 8 mg a day and Toprol-XL 50 mg a day.  # 2 Chronic renal insufficiency stage IV. GFR 20, stable over the last couple of years. Nephrology follows him. Electrolytes and creatinine monitoring today.  #3 history of acute blood loss anemia. Presumed diverticular bleed. No further bleeding. He has been on iron for at least the last 6 weeks. Monitor hemoglobin today.  #4 history of acute coronary syndrome, impaired systolic function. Poor cath candidate due to chronic renal insufficiency. Cardiology consult and recent hospitalization--they suspected ischemic cardiomyopathy.  He is on low-dose beta-blocker and heart rate too low to push the dose. He is on aspirin. He prefers no statin.  #5 debilitated patient. He is gradually regaining some strength.  His level of functioning now is still such that he requires constant supervision. He is not interested in physical therapy at this time.  An After Visit Summary was printed and given to the patient.  FOLLOW UP: Return in about 4 weeks (around 10/04/2021) for f/u htn.  Signed:  Crissie Sickles, MD           09/06/2021

## 2021-09-06 NOTE — Telephone Encounter (Signed)
A user error has taken place: encounter opened in error, closed for administrative reasons.

## 2021-09-24 ENCOUNTER — Encounter: Payer: Self-pay | Admitting: Family Medicine

## 2021-09-24 NOTE — Telephone Encounter (Signed)
Please advise on message below.

## 2021-09-25 NOTE — Telephone Encounter (Signed)
No medication changes at this time.

## 2021-11-14 ENCOUNTER — Encounter: Payer: Self-pay | Admitting: Family Medicine

## 2021-11-14 NOTE — Telephone Encounter (Signed)
FYI

## 2021-11-17 ENCOUNTER — Other Ambulatory Visit: Payer: Self-pay | Admitting: Family Medicine

## 2021-11-20 ENCOUNTER — Encounter: Payer: Self-pay | Admitting: Family Medicine

## 2021-11-20 ENCOUNTER — Other Ambulatory Visit: Payer: Self-pay | Admitting: Family Medicine

## 2021-12-19 ENCOUNTER — Encounter: Payer: Self-pay | Admitting: Family Medicine

## 2021-12-19 NOTE — Telephone Encounter (Signed)
FL2 needs to be initiated by a particular NH facility after there has been communication between the NH and the patient/family.

## 2021-12-19 NOTE — Telephone Encounter (Signed)
FYI

## 2021-12-19 NOTE — Telephone Encounter (Signed)
Sent as FYI for next appt

## 2021-12-21 ENCOUNTER — Other Ambulatory Visit: Payer: Self-pay | Admitting: Family Medicine

## 2021-12-24 DIAGNOSIS — Z87891 Personal history of nicotine dependence: Secondary | ICD-10-CM | POA: Diagnosis not present

## 2021-12-24 DIAGNOSIS — I129 Hypertensive chronic kidney disease with stage 1 through stage 4 chronic kidney disease, or unspecified chronic kidney disease: Secondary | ICD-10-CM | POA: Diagnosis not present

## 2021-12-24 DIAGNOSIS — A419 Sepsis, unspecified organism: Secondary | ICD-10-CM | POA: Diagnosis not present

## 2021-12-24 DIAGNOSIS — I3139 Other pericardial effusion (noninflammatory): Secondary | ICD-10-CM | POA: Diagnosis not present

## 2021-12-24 DIAGNOSIS — R4182 Altered mental status, unspecified: Secondary | ICD-10-CM | POA: Diagnosis not present

## 2021-12-24 DIAGNOSIS — N261 Atrophy of kidney (terminal): Secondary | ICD-10-CM | POA: Diagnosis not present

## 2021-12-24 DIAGNOSIS — E1122 Type 2 diabetes mellitus with diabetic chronic kidney disease: Secondary | ICD-10-CM | POA: Diagnosis not present

## 2021-12-24 DIAGNOSIS — S0990XA Unspecified injury of head, initial encounter: Secondary | ICD-10-CM | POA: Diagnosis not present

## 2021-12-24 DIAGNOSIS — K753 Granulomatous hepatitis, not elsewhere classified: Secondary | ICD-10-CM | POA: Diagnosis not present

## 2021-12-24 DIAGNOSIS — R410 Disorientation, unspecified: Secondary | ICD-10-CM | POA: Diagnosis not present

## 2021-12-24 DIAGNOSIS — K828 Other specified diseases of gallbladder: Secondary | ICD-10-CM | POA: Diagnosis not present

## 2021-12-24 DIAGNOSIS — N184 Chronic kidney disease, stage 4 (severe): Secondary | ICD-10-CM | POA: Diagnosis not present

## 2021-12-24 DIAGNOSIS — R531 Weakness: Secondary | ICD-10-CM | POA: Diagnosis not present

## 2021-12-24 DIAGNOSIS — R5383 Other fatigue: Secondary | ICD-10-CM | POA: Diagnosis not present

## 2021-12-24 DIAGNOSIS — M549 Dorsalgia, unspecified: Secondary | ICD-10-CM | POA: Diagnosis not present

## 2021-12-24 DIAGNOSIS — N200 Calculus of kidney: Secondary | ICD-10-CM | POA: Diagnosis not present

## 2021-12-24 DIAGNOSIS — S3992XA Unspecified injury of lower back, initial encounter: Secondary | ICD-10-CM | POA: Diagnosis not present

## 2021-12-24 DIAGNOSIS — I517 Cardiomegaly: Secondary | ICD-10-CM | POA: Diagnosis not present

## 2021-12-24 DIAGNOSIS — K5900 Constipation, unspecified: Secondary | ICD-10-CM | POA: Diagnosis not present

## 2021-12-24 DIAGNOSIS — J8410 Pulmonary fibrosis, unspecified: Secondary | ICD-10-CM | POA: Diagnosis not present

## 2021-12-25 ENCOUNTER — Encounter: Payer: Self-pay | Admitting: Family Medicine

## 2021-12-26 NOTE — Telephone Encounter (Signed)
Risk: Can we track down an FL 2 and we will see what we can do.

## 2021-12-26 NOTE — Telephone Encounter (Signed)
FL2 form started and will place on PCP desk for final completion

## 2021-12-26 NOTE — Telephone Encounter (Signed)
Placed on PCP desk to review and sign, if appropriate.  

## 2021-12-26 NOTE — Telephone Encounter (Signed)
Please review and advise.

## 2021-12-26 NOTE — Telephone Encounter (Signed)
Correction: I meant to say Britt, not risk.

## 2022-01-01 ENCOUNTER — Encounter: Payer: Self-pay | Admitting: Family Medicine

## 2022-01-01 ENCOUNTER — Ambulatory Visit (INDEPENDENT_AMBULATORY_CARE_PROVIDER_SITE_OTHER): Payer: Medicare Other | Admitting: Family Medicine

## 2022-01-01 VITALS — BP 130/80 | HR 68 | Temp 97.8°F | Ht 69.0 in | Wt 159.2 lb

## 2022-01-01 DIAGNOSIS — I255 Ischemic cardiomyopathy: Secondary | ICD-10-CM

## 2022-01-01 DIAGNOSIS — S20229D Contusion of unspecified back wall of thorax, subsequent encounter: Secondary | ICD-10-CM

## 2022-01-01 DIAGNOSIS — R5381 Other malaise: Secondary | ICD-10-CM

## 2022-01-01 DIAGNOSIS — K5909 Other constipation: Secondary | ICD-10-CM

## 2022-01-01 DIAGNOSIS — R296 Repeated falls: Secondary | ICD-10-CM

## 2022-01-01 NOTE — Progress Notes (Signed)
OFFICE VISIT  01/01/2022  CC:  Chief Complaint  Patient presents with   Fatigue    Pt seen at ED on 6/27 for fall.     Patient is a 88 y.o. male who presents accompanied by his wife Gary Morgan and daughter Gary Morgan for poor p.o. intake.  HPI: Gary Morgan had not eaten well at all in the last 2 days.  He has complained of some nausea here in the office today but has not vomited. He denies abdominal pain and denies feeling the need to defecate. He does have chronic constipation and has been on MiraLAX once a day regularly. Then, on 12/24/2021 he had a fall and was having some back and right side pain so he was brought to the ER and a CT scan of the abdomen showed severe constipation.  Since then he has been taking MiraLAX 1 capful 3 times a day and he has had a couple of large bowel movements.  Over the last day or so he has had only small amount of liquid stool. Reviewed his ED encounter data from 12/24/2021.  CBC was normal, complete metabolic panel normal other than his serum creatinine of 2.8, which is his baseline.  Urinalysis showed 3-4 red blood cells per high-power field but was otherwise normal.  No fractures were found.  He was given a shot of morphine for his pain. Since going home he has still complained of pain in his back and right side when he turns to try to get out of his wheelchair, otherwise no pain.  Denies focal weakness He is short-term memory continues to decline and he is getting confused more often. Anytime the family mentions nursing home care he gets angry.  ROS as above, plus--> no fevers, no CP, no SOB, no wheezing, no cough, no dizziness, no HAs, no rashes, no melena/hematochezia.  No polyuria or polydipsia.  No myalgias or arthralgias.  No focal weakness, paresthesias, or tremors.  No acute vision or hearing abnormalities.  No dysuria or unusual/new urinary urgency or frequency.  No recent changes in lower legs.  No palpitations.     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 Dois Davenport (urol).  Dr. Jeffie Pollock suspects this was due to stretch injury and has resolved as of 09/24/16.   Anemia 06/2021   blood loss (lower GI bleed) + anemia of chronic renal insufficiency   Arthritis of right wrist    s/p fracture of middle third of scaphoid bone sustained cleaning up from [facility] injection by Dr. Napoleon Form in [place] (ortho) made the pain go away.   BPH with obstruction/lower urinary tract symptoms    hx of urinary retention (s/p foley cath when in hosp for urosepsis 04/2016).  +Areflexic bladder.  TURP will not help since he has acontractile bladder.  Options are CIC, indwelling urethral foley, or suprapubic catheter, HOLEP or simple prostatectomy.  HOLEP done 10/2019.   Cholelithiasis 06/2021   w/out cholecystitis   Chronic low back pain    Chronic renal insufficiency, stage 4 (severe) (HCC)    CrCl 30s (saw nephrologist in [place]). +microalbuminuria 04/2014 at PCP in [place].  Stage IV 2018: GFR upper 20s. Obstruc urop contrib, bilat hydroureteroneph->HoLEP 11/2019. Recur urin retent, GFR dec to about 20->to nephrol 2022.   COPD (chronic obstructive pulmonary disease) ([place])    on CXR 09/27/2015   Debilitated patient    Diabetes mellitus ([place])    2015 HbA1c 6.2-6.3 %.  HbA1c  06/2016 5.4%.  A1c 6.5% 05/2017 (this was his first A1c of 6.5% or more)--nutritionist referral recommended.   Diverticulosis 04/2016   Noted on CT 04/2016   Elevated PSA 06/2016   Dr. Redmond Pulling ([facility] urologist)-awaiting records (pt's wife reported pt's psa was 7).  Dr. Jeffie Pollock feels like this may have been a result of relatively recent episode of urinary retention, recheck PSA 12/29/16 was lower: 4.64, 24% f/t ratio, plan recheck 1 yr (Dr. Jeffie Pollock).    GI bleed 06/2021   colonic   History of anemia due to chronic kidney disease    History of pyelonephritis 04/2016   History of sepsis 04/2016   e coli   Hypertension     Ischemic cardiomyopathy 2023   Patient is a poor cath candidate due to significant chronic renal insufficiency   Kidney stones    one episode   Recurrent UTI 2017   Renal cysts, acquired, bilateral    one dominant on R, Bosniak type II. (04/2016 CT)   Unstable gait    Urge incontinence     Past Surgical History:  Procedure Laterality Date   CATARACT EXTRACTION, BILATERAL     HOLEP-LASER ENUCLEATION OF THE PROSTATE WITH MORCELLATION N/A 11/18/2019   Path benign. Procedure: HOLEP-LASER ENUCLEATION OF THE PROSTATE WITH MORCELLATION;  Surgeon: Billey Co, MD;  Location: [facility] ORS;  Service: Urology;  Laterality: N/A;   TONSILLECTOMY  1940   TRANSTHORACIC ECHOCARDIOGRAM  06/2021   EF 40-45%, diffuse LV hypokinesis   VENTRAL HERNIA REPAIR  2010   with strangulation of SB (approx 1 foot of SB had to be excised).  Had 3 total surgeries for this due to complications    Outpatient Medications Prior to Visit  Medication Sig Dispense Refill   aspirin EC 81 MG tablet Take 81 mg by mouth daily. Swallow whole.     Cholecalciferol (VITAMIN D3 PO) Take 1,000 Units by mouth 3 (three) times a week.     doxazosin (CARDURA) 8 MG tablet Take 1 tablet by mouth once daily 30 tablet 0   ferrous sulfate 325 (65 FE) MG tablet Take 325 mg by mouth daily.     HYDROcodone-acetaminophen (NORCO) 7.5-325 MG tablet Take 1 tablet by mouth every 4 (four) hours as needed.     isosorbide mononitrate (IMDUR) 60 MG 24 hr tablet Take 1 tablet by mouth once daily 60 tablet 0   metoprolol succinate (TOPROL-XL) 50 MG 24 hr tablet Take with or immediately following a meal. 90 tablet 3   No facility-administered medications prior to visit.    Allergies  Allergen Reactions   Amlodipine Other (See Comments)    Disequilibrium, generalized weakness.    ROS As per HPI  PE:    01/01/2022   11:06 AM 09/06/2021    9:22 AM 07/17/2021    3:21 PM  Vitals with BMI  Height 5\' 9"  5\' 9"  5\' 9"   Weight 159 lbs 3 oz 168  lbs 3 oz 165 lbs 13 oz  BMI 23.5 53.61 44.31  Systolic 540 086 761  Diastolic 80 71 68  Pulse 68 56 56   Physical Exam  Gen: Alert, well appearing.  Patient is oriented to person, general place and general situation. AFFECT: pleasant, lucid thought and speech. Abdomen: Soft, nontender and nondistended.  Bowel sounds hyperactive. Neuro: No focal deficits. Musculoskeletal: Mild tenderness to palpation in the midline lumbar spine.  No paraspinous tenderness, no ribs tenderness, no deformity or bruising.  LABS:  Last CBC Lab Results  Component Value Date   WBC 6.5 09/06/2021   HGB 11.9 (L) 09/06/2021   HCT 35.7 (L) 09/06/2021   MCV 95.9 09/06/2021   MCH 31.8 08/10/2019   RDW 14.1 09/06/2021   PLT 141.0 (L) 09/06/2021   Lab Results  Component Value Date   IRON 25 (L) 07/17/2021   TIBC 285 07/17/2021   FERRITIN 49 78/29/5621   Last metabolic panel Lab Results  Component Value Date   GLUCOSE 107 (H) 09/06/2021   NA 141 09/06/2021   K 4.3 09/06/2021   CL 105 09/06/2021   CO2 27 09/06/2021   BUN 39 (H) 09/06/2021   CREATININE 2.54 (H) 09/06/2021   GFRNONAA 31 01/10/2021   CALCIUM 8.8 09/06/2021   PHOS 2.7 08/10/2019   PROT 6.7 12/13/2019   ALBUMIN 4.0 08/14/2020   ALBUMIN 4.0 08/14/2020   BILITOT 0.8 12/13/2019   ALKPHOS 67 12/13/2019   AST 15 12/13/2019   ALT 9 12/13/2019   Last hemoglobin A1c Lab Results  Component Value Date   HGBA1C 5.9 06/26/2021   Last thyroid functions Lab Results  Component Value Date   TSH 1.86 12/13/2019   Lab Results  Component Value Date   VITAMINB12 225 09/06/2021   IMPRESSION AND PLAN:  #1 debilitated patient, recurrent falls, dementia,generalized failure to thrive. Contusion of the lower back recently, may take Tylenol 500 mg every 6 hour as needed.  I recommended he not take the hydrocodone recently prescribed by the ED because this will make his constipation worse.  It may have even contributed to his recent  nausea. Family is looking into nursing home placement, FL 2 completed.  #2 severe constipation. Continue MiraLAX 1 capful 3 times a day--this seems to have produced a bowel movement every couple of days since he left the ER.  He has some intermittent nausea and he will not eat much, likely related to his constipation. However I do not feel like he is impacted.  We discussed the possibility of use of fleets enema at home and decided to hold off at this time.  #3 hypertension, stable on Toprol-XL 50 mg a day and Imdur 60 mg a day.  #4 chronic renal insufficiency stage IV. Most recent serum creatinine 12/24/2021 in the emergency department was at his baseline (2.8). Electrolytes normal.  An After Visit Summary was printed and given to the patient.  FOLLOW UP: Return if symptoms worsen or fail to improve.  Signed:  Crissie Sickles, MD           01/01/2022

## 2022-01-09 ENCOUNTER — Ambulatory Visit: Payer: Medicare Other | Admitting: Family Medicine

## 2022-01-14 ENCOUNTER — Other Ambulatory Visit: Payer: Self-pay | Admitting: Family Medicine

## 2022-01-14 ENCOUNTER — Ambulatory Visit (INDEPENDENT_AMBULATORY_CARE_PROVIDER_SITE_OTHER): Payer: Medicare Other | Admitting: Family Medicine

## 2022-01-14 ENCOUNTER — Encounter: Payer: Self-pay | Admitting: Family Medicine

## 2022-01-14 ENCOUNTER — Ambulatory Visit (HOSPITAL_BASED_OUTPATIENT_CLINIC_OR_DEPARTMENT_OTHER)
Admission: RE | Admit: 2022-01-14 | Discharge: 2022-01-14 | Disposition: A | Discharge status: Home / Self Care | Payer: Medicare Other | Source: Ambulatory Visit | Attending: Family Medicine | Admitting: Family Medicine

## 2022-01-14 VITALS — BP 109/68 | HR 66 | Temp 97.6°F | Wt 159.0 lb

## 2022-01-14 DIAGNOSIS — R110 Nausea: Secondary | ICD-10-CM

## 2022-01-14 DIAGNOSIS — M545 Low back pain, unspecified: Secondary | ICD-10-CM | POA: Diagnosis not present

## 2022-01-14 DIAGNOSIS — S32010A Wedge compression fracture of first lumbar vertebra, initial encounter for closed fracture: Secondary | ICD-10-CM | POA: Diagnosis not present

## 2022-01-14 DIAGNOSIS — M5136 Other intervertebral disc degeneration, lumbar region: Secondary | ICD-10-CM | POA: Diagnosis not present

## 2022-01-14 DIAGNOSIS — I10 Essential (primary) hypertension: Secondary | ICD-10-CM | POA: Diagnosis not present

## 2022-01-14 DIAGNOSIS — I255 Ischemic cardiomyopathy: Secondary | ICD-10-CM | POA: Diagnosis not present

## 2022-01-14 MED ORDER — DOXAZOSIN MESYLATE 8 MG PO TABS
8.0000 mg | ORAL_TABLET | Freq: Every day | ORAL | 1 refills | Status: DC
Start: 2022-01-14 — End: 2022-11-14

## 2022-01-14 MED ORDER — ISOSORBIDE MONONITRATE ER 60 MG PO TB24
60.0000 mg | ORAL_TABLET | Freq: Every day | ORAL | 1 refills | Status: DC
Start: 2022-01-14 — End: 2022-11-14

## 2022-01-14 NOTE — Progress Notes (Signed)
OFFICE VISIT  01/14/2022  CC:  Chief Complaint  Patient presents with   Hypertension    Pt is not fasting   Patient is a 88 y.o. male who presents for 2 week f/u constipation, chronic debilitation, and HTN. He has ischemic CM (not cath candidate) and obstructive uropathy +medical renal dz as wel (baseline sCr 2.8).  A/P as of last visit: "#1 debilitated patient, recurrent falls, dementia,generalized failure to thrive. Contusion of the lower back recently, may take Tylenol 500 mg every 6 hour as needed.  I recommended he not take the hydrocodone recently prescribed by the ED because this will make his constipation worse.  It may have even contributed to his recent nausea. Family is looking into nursing home placement, FL 2 completed.   #2 severe constipation. Continue MiraLAX 1 capful 3 times a day--this seems to have produced a bowel movement every couple of days since he left the ER.  He has some intermittent nausea and he will not eat much, likely related to his constipation. However I do not feel like he is impacted.  We discussed the possibility of use of fleets enema at home and decided to hold off at this time.   #3 hypertension, stable on Toprol-XL 50 mg a day and Imdur 60 mg a day.   #4 chronic renal insufficiency stage IV. Most recent serum creatinine 12/24/2021 in the emergency department was at his baseline (2.8). Electrolytes normal.  INTERIM HX: He has had ongoing low back pain that is severe at times.  It seems to be complains about this when sitting in a chair but also when he turns his torso, particularly with turning to the right. His comfortable position is laying in bed on his side. Taking Tylenol for pain but not helping much at all.  He is nauseous but no vomiting.  His appetite is down. No abdominal pain or distention. After last visit the MiraLAX resulted in loose stools.  She cut back on the MiraLAX and most recently he has had a couple of large solid bowel  movements.  ROS as above, plus--> no fevers, no CP, no SOB, no wheezing, no cough, no dizziness, no HAs, no rashes, no melena/hematochezia.  No polyuria or polydipsia.  No myalgias.  No focal weakness, paresthesias, or tremors.  No acute vision or hearing abnormalities.  No dysuria or unusual/new urinary urgency or frequency.  No recent changes in lower legs. No palpitations.    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 Dois Davenport (urol).  Dr. Jeffie Pollock suspects this was due to stretch injury and has resolved as of 09/24/16.   Anemia 06/2021   blood loss (lower GI bleed) + anemia of chronic renal insufficiency   Arthritis of right wrist    s/p fracture of middle third of scaphoid bone sustained cleaning up from [facility] injection by Dr. Napoleon Form in [place] (ortho) made the pain go away.   BPH with obstruction/lower urinary tract symptoms    hx of urinary retention (s/p foley cath when in hosp for urosepsis 04/2016).  +Areflexic bladder.  TURP will not help since he has acontractile bladder.  Options are CIC, indwelling urethral foley, or suprapubic catheter, HOLEP or simple prostatectomy.  HOLEP done 10/2019.   Cholelithiasis 06/2021   w/out cholecystitis   Chronic low back pain    Chronic renal insufficiency, stage 4 (severe) (HCC)    CrCl 30s (saw nephrologist in [place]). +microalbuminuria 04/2014 at PCP  in [place].  Stage IV 2018: GFR upper 20s. Obstruc urop contrib, bilat hydroureteroneph->HoLEP 11/2019. Recur urin retent, GFR dec to about 20->to nephrol 2022.   COPD (chronic obstructive pulmonary disease) ([place])    on CXR 09/27/2015   Debilitated patient    Diabetes mellitus ([place])    2015 HbA1c 6.2-6.3 %.  HbA1c 06/2016 5.4%.  A1c 6.5% 05/2017 (this was his first A1c of 6.5% or more)--nutritionist referral recommended.   Diverticulosis 04/2016   Noted on CT 04/2016   Elevated PSA 06/2016   Dr. Redmond Pulling ([facility]  urologist)-awaiting records (pt's wife reported pt's psa was 7).  Dr. Jeffie Pollock feels like this may have been a result of relatively recent episode of urinary retention, recheck PSA 12/29/16 was lower: 4.64, 24% f/t ratio, plan recheck 1 yr (Dr. Jeffie Pollock).    GI bleed 06/2021   colonic   History of anemia due to chronic kidney disease    History of pyelonephritis 04/2016   History of sepsis 04/2016   e coli   Hypertension    Ischemic cardiomyopathy 2023   Patient is a poor cath candidate due to significant chronic renal insufficiency   Kidney stones    one episode   Recurrent UTI 2017   Renal cysts, acquired, bilateral    one dominant on R, Bosniak type II. (04/2016 CT)   Unstable gait    Urge incontinence     Past Surgical History:  Procedure Laterality Date   CATARACT EXTRACTION, BILATERAL     HOLEP-LASER ENUCLEATION OF THE PROSTATE WITH MORCELLATION N/A 11/18/2019   Path benign. Procedure: HOLEP-LASER ENUCLEATION OF THE PROSTATE WITH MORCELLATION;  Surgeon: Billey Co, MD;  Location: [facility] ORS;  Service: Urology;  Laterality: N/A;   TONSILLECTOMY  1940   TRANSTHORACIC ECHOCARDIOGRAM  06/2021   EF 40-45%, diffuse LV hypokinesis   VENTRAL HERNIA REPAIR  2010   with strangulation of SB (approx 1 foot of SB had to be excised).  Had 3 total surgeries for this due to complications    Outpatient Medications Prior to Visit  Medication Sig Dispense Refill   aspirin EC 81 MG tablet Take 81 mg by mouth daily. Swallow whole.     Cholecalciferol (VITAMIN D3 PO) Take 1,000 Units by mouth 3 (three) times a week.     ferrous sulfate 325 (65 FE) MG tablet Take 325 mg by mouth daily.     metoprolol succinate (TOPROL-XL) 50 MG 24 hr tablet Take with or immediately following a meal. 90 tablet 3   HYDROcodone-acetaminophen (NORCO) 7.5-325 MG tablet Take 1 tablet by mouth every 4 (four) hours as needed. (Patient not taking: Reported on 01/14/2022)     doxazosin (CARDURA) 8 MG tablet Take 1 tablet by  mouth once daily 30 tablet 0   isosorbide mononitrate (IMDUR) 60 MG 24 hr tablet Take 1 tablet by mouth once daily 60 tablet 0   No facility-administered medications prior to visit.    Allergies  Allergen Reactions   Amlodipine Other (See Comments)    Disequilibrium, generalized weakness.    ROS As per HPI  PE:    01/14/2022    9:46 AM 01/01/2022   11:06 AM 09/06/2021    9:22 AM  Vitals with BMI  Height  5\' 9"  5\' 9"   Weight 159 lbs 159 lbs 3 oz 168 lbs 3 oz  BMI 23.47 95.1 88.41  Systolic 660 630 160  Diastolic 68 80 71  Pulse 66 68 56     Physical Exam  Gen: Alert, chronically ill appearing, sitting in wheelchair.  No acute distress.  Patient is oriented to person, place, general time and general situation. Cardiovascular: Regular rhythm and rate without murmur rub or gallop. Chest is clear, no wheezing or rales. Normal symmetric air entry throughout both lung fields. No chest wall deformities or tenderness. EXT: no clubbing or cyanosis.  no edema.  Back: He denies any tenderness to palpation from the T-spine down to the SI region.  LABS:  Last CBC Lab Results  Component Value Date   WBC 6.5 09/06/2021   HGB 11.9 (L) 09/06/2021   HCT 35.7 (L) 09/06/2021   MCV 95.9 09/06/2021   MCH 31.8 08/10/2019   RDW 14.1 09/06/2021   PLT 141.0 (L) 09/06/2021   Lab Results  Component Value Date   IRON 25 (L) 07/17/2021   TIBC 285 07/17/2021   FERRITIN 49 96/78/9381   Last metabolic panel Lab Results  Component Value Date   GLUCOSE 107 (H) 09/06/2021   NA 141 09/06/2021   K 4.3 09/06/2021   CL 105 09/06/2021   CO2 27 09/06/2021   BUN 39 (H) 09/06/2021   CREATININE 2.54 (H) 09/06/2021   GFRNONAA 31 01/10/2021   CALCIUM 8.8 09/06/2021   PHOS 2.7 08/10/2019   PROT 6.7 12/13/2019   ALBUMIN 4.0 08/14/2020   ALBUMIN 4.0 08/14/2020   BILITOT 0.8 12/13/2019   ALKPHOS 67 12/13/2019   AST 15 12/13/2019   ALT 9 12/13/2019   Last lipids Lab Results  Component Value  Date   CHOL 178 07/12/2019   HDL 41.40 07/12/2019   LDLCALC 106 (H) 07/12/2019   TRIG 154.0 (H) 07/12/2019   CHOLHDL 4 07/12/2019   Last hemoglobin A1c Lab Results  Component Value Date   HGBA1C 5.9 06/26/2021   Last thyroid functions Lab Results  Component Value Date   TSH 1.86 12/13/2019   IMPRESSION AND PLAN:  #1 acute low back pain, onset 12/24/2021 when he fell. Imaging in the emergency department was a CT chest abdomen pelvis.  The only comment on osseous structures was that there was no acute abnormalities. Tylenol not helpful. I believe his pain is leading to his nausea and poor p.o. intake. We have not wanted to start opioid in the past due to its potential for making his constipation worse and or drowsiness/risk of falls. I think we have to resort to this now--start Norco 7.5/320 5/2 tab twice a day. He has MiraLAX and wife will adjust dose as necessary. He rarely tries to get up from his wheelchair and he has discouraged from doing so unless he has someone directly next to him to help. He has not cooperated with home health physical therapy in the past.  #2 nausea without vomiting. At this point I feel like this is due to his pain combined with fluctuating level of constipation.  We will get more aggressive in treating his pain, see #1 above. Of note, complete metabolic panel and CBC were normal 12/24/2021 in the emergency department, with the exception of his serum creatinine was at his baseline of 2.8.  #3 hypertension, well controlled.  Continue Toprol-XL 50 mg daily, Imdur 60 mg a day, and doxazosin 8 mg daily  An After Visit Summary was printed and given to the patient.  FOLLOW UP: Return in about 1 week (around 01/21/2022) for Follow-up pain and nausea--virtual visit.  Signed:  Crissie Sickles, MD           01/14/2022

## 2022-01-15 ENCOUNTER — Other Ambulatory Visit: Payer: Self-pay | Admitting: Family Medicine

## 2022-01-15 DIAGNOSIS — I714 Abdominal aortic aneurysm, without rupture, unspecified: Secondary | ICD-10-CM

## 2022-01-18 ENCOUNTER — Encounter: Payer: Self-pay | Admitting: Family Medicine

## 2022-01-21 ENCOUNTER — Ambulatory Visit (HOSPITAL_BASED_OUTPATIENT_CLINIC_OR_DEPARTMENT_OTHER)
Admission: RE | Admit: 2022-01-21 | Discharge: 2022-01-21 | Disposition: A | Discharge status: Home / Self Care | Payer: Medicare Other | Source: Ambulatory Visit | Attending: Family Medicine | Admitting: Family Medicine

## 2022-01-21 DIAGNOSIS — I714 Abdominal aortic aneurysm, without rupture, unspecified: Secondary | ICD-10-CM | POA: Insufficient documentation

## 2022-01-21 DIAGNOSIS — Z136 Encounter for screening for cardiovascular disorders: Secondary | ICD-10-CM | POA: Diagnosis not present

## 2022-01-21 NOTE — Telephone Encounter (Signed)
Spoke with pt's wife, [name] and informed we completed an FL2 form for pt during last visit. She stated one of the nursing home they were interested in kept original FL2 form. She will contact SNF and see if they can fax [place] their FL2 form for completion. Demographics sheet printed along with snapshot.  Will confirm with provider if physical results sheet referring to labs. Pt has not had labs done with our office since March this year.

## 2022-01-22 ENCOUNTER — Encounter: Payer: Self-pay | Admitting: Family Medicine

## 2022-01-24 ENCOUNTER — Telehealth (INDEPENDENT_AMBULATORY_CARE_PROVIDER_SITE_OTHER): Payer: Medicare Other | Admitting: Family Medicine

## 2022-01-24 ENCOUNTER — Encounter: Payer: Self-pay | Admitting: Family Medicine

## 2022-01-24 VITALS — BP 127/91 | HR 65

## 2022-01-24 DIAGNOSIS — K5909 Other constipation: Secondary | ICD-10-CM

## 2022-01-24 DIAGNOSIS — M545 Low back pain, unspecified: Secondary | ICD-10-CM

## 2022-01-24 DIAGNOSIS — I10 Essential (primary) hypertension: Secondary | ICD-10-CM | POA: Diagnosis not present

## 2022-01-24 DIAGNOSIS — I255 Ischemic cardiomyopathy: Secondary | ICD-10-CM

## 2022-01-24 NOTE — Progress Notes (Signed)
Virtual Visit via Video Note  I connected with Gary Morgan and wife [name] on 01/24/22 at 10:40 AM EDT by a video enabled telemedicine application and verified that I am speaking with the correct person using two identifiers.  Location patient: [place] Location provider:work or home office Persons participating in the virtual visit: patient, provider  I discussed the limitations and requested verbal permission for telemedicine visit. The patient expressed understanding and agreed to proceed.  CC 88 year old male being seen today for a 10-day follow-up hypertension and back pain. A/P as of last visit: "#1 acute low back pain, onset 12/24/2021 when he fell. Imaging in the emergency department was a CT chest abdomen pelvis.  The only comment on osseous structures was that there was no acute abnormalities. Tylenol not helpful. I believe his pain is leading to his nausea and poor p.o. intake. We have not wanted to start opioid in the past due to its potential for making his constipation worse and or drowsiness/risk of falls. I think we have to resort to this now--start Norco 7.5/325 1/2 tab twice a day. He has MiraLAX and wife will adjust dose as necessary. He rarely tries to get up from his wheelchair and he has discouraged from doing so unless he has someone directly next to him to help. He has not cooperated with home health physical therapy in the past.   #2 nausea without vomiting. At this point I feel like this is due to his pain combined with fluctuating level of constipation.  We will get more aggressive in treating his pain, see #1 above. Of note, complete metabolic panel and CBC were normal 12/24/2021 in the emergency department, with the exception of his serum creatinine was at his baseline of 2.8.   #3 hypertension, well controlled.  Continue Toprol-XL 50 mg daily, Imdur 60 mg a day, and doxazosin 8 mg daily"  INTERIM HX:  Low back plain films on 01/15/2020 showed L1 compression fracture with  minimal height loss, indeterminate age but new since 2021. The finding of the L1 compression fracture does correlate with his pain.  Currently: He has not complained of pain lately at all, not requiring the hydrocodone. Blood pressures have been in the low 130s over the low 80s, heart rate around 60. Constipation is stable: Seems to have a good evacuation every 3 to 4 days, small amounts of looser stool in between.  No abdominal pain.  No nausea or vomiting.  ROS: See pertinent positives and negatives per HPI.  Past Medical History:  Diagnosis Date   AAA (abdominal aortic aneurysm) (HCC)    12/2021-> 3.5 cm.  Repeat 3 years.   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 Dois Davenport (urol).  Dr. Jeffie Pollock suspects this was due to stretch injury and has resolved as of 09/24/16.   Anemia 06/2021   blood loss (lower GI bleed) + anemia of chronic renal insufficiency   Arthritis of right wrist    s/p fracture of middle third of scaphoid bone sustained cleaning up from [facility] injection by Dr. Napoleon Form in [place] (ortho) made the pain go away.   BPH with obstruction/lower urinary tract symptoms    hx of urinary retention (s/p foley cath when in hosp for urosepsis 04/2016).  +Areflexic bladder.  TURP will not help since he has acontractile bladder.  Options are CIC, indwelling urethral foley, or suprapubic catheter, HOLEP or simple prostatectomy.  HOLEP done 10/2019.   Cholelithiasis 06/2021   w/out cholecystitis   Chronic low back  pain    Chronic renal insufficiency, stage 4 (severe) (HCC)    CrCl 30s (saw nephrologist in [place]). +microalbuminuria 04/2014 at PCP in [place].  Stage IV 2018: GFR upper 20s. Obstruc urop contrib, bilat hydroureteroneph->HoLEP 11/2019. Recur urin retent, GFR dec to about 20->to nephrol 2022.   COPD (chronic obstructive pulmonary disease) ([place])    on CXR 09/27/2015   Debilitated patient    Diabetes mellitus ([place])    2015 HbA1c  6.2-6.3 %.  HbA1c 06/2016 5.4%.  A1c 6.5% 05/2017 (this was his first A1c of 6.5% or more)--nutritionist referral recommended.   Diverticulosis 04/2016   Noted on CT 04/2016   Elevated PSA 06/2016   Dr. Redmond Pulling ([facility] urologist)-awaiting records (Gary Morgan's wife reported Gary Morgan's psa was 7).  Dr. Jeffie Pollock feels like this may have been a result of relatively recent episode of urinary retention, recheck PSA 12/29/16 was lower: 4.64, 24% f/t ratio, plan recheck 1 yr (Dr. Jeffie Pollock).    GI bleed 06/2021   colonic   History of anemia due to chronic kidney disease    History of pyelonephritis 04/2016   History of sepsis 04/2016   e coli   Hypertension    Ischemic cardiomyopathy 2023   Patient is a poor cath candidate due to significant chronic renal insufficiency   Kidney stones    one episode   Recurrent UTI 2017   Renal cysts, acquired, bilateral    one dominant on R, Bosniak type II. (04/2016 CT)   Unstable gait    Urge incontinence     Past Surgical History:  Procedure Laterality Date   CATARACT EXTRACTION, BILATERAL     HOLEP-LASER ENUCLEATION OF THE PROSTATE WITH MORCELLATION N/A 11/18/2019   Path benign. Procedure: HOLEP-LASER ENUCLEATION OF THE PROSTATE WITH MORCELLATION;  Surgeon: Billey Co, MD;  Location: [facility] ORS;  Service: Urology;  Laterality: N/A;   TONSILLECTOMY  1940   TRANSTHORACIC ECHOCARDIOGRAM  06/2021   EF 40-45%, diffuse LV hypokinesis   VENTRAL HERNIA REPAIR  2010   with strangulation of SB (approx 1 foot of SB had to be excised).  Had 3 total surgeries for this due to complications     Current Outpatient Medications:    aspirin EC 81 MG tablet, Take 81 mg by mouth daily. Swallow whole., Disp: , Rfl:    Cholecalciferol (VITAMIN D3 PO), Take 1,000 Units by mouth 3 (three) times a week., Disp: , Rfl:    doxazosin (CARDURA) 8 MG tablet, Take 1 tablet (8 mg total) by mouth daily., Disp: 90 tablet, Rfl: 1   ferrous sulfate 325 (65 FE) MG tablet, Take 325 mg by mouth daily.,  Disp: , Rfl:    HYDROcodone-acetaminophen (NORCO) 7.5-325 MG tablet, Take 1 tablet by mouth every 4 (four) hours as needed. (Patient not taking: Reported on 01/14/2022), Disp: , Rfl:    isosorbide mononitrate (IMDUR) 60 MG 24 hr tablet, Take 1 tablet (60 mg total) by mouth daily., Disp: 90 tablet, Rfl: 1   metoprolol succinate (TOPROL-XL) 50 MG 24 hr tablet, Take with or immediately following a meal., Disp: 90 tablet, Rfl: 3  EXAM:  VITALS per patient if applicable:     9/83/3825    9:46 AM 01/01/2022   11:06 AM 09/06/2021    9:22 AM  Vitals with BMI  Height  5\' 9"  5\' 9"   Weight 159 lbs 159 lbs 3 oz 168 lbs 3 oz  BMI 23.47 05.3 97.67  Systolic 341 937 902  Diastolic 68 80 71  Pulse  66 68 56    GENERAL: alert, oriented, appears well and in no acute distress  HEENT: atraumatic, conjunttiva clear, no obvious abnormalities on inspection of external nose and ears  NECK: normal movements of the head and neck  LUNGS: on inspection no signs of respiratory distress, breathing rate appears normal, no obvious gross SOB, gasping or wheezing  CV: no obvious cyanosis  MS: moves all visible extremities without noticeable abnormality  PSYCH/NEURO: pleasant and cooperative, no obvious depression or anxiety, speech and thought processing grossly intact  LABS: none today    Chemistry      Component Value Date/Time   NA 141 09/06/2021 1005   NA 138 08/14/2020 0000   NA 138 08/14/2020 0000   K 4.3 09/06/2021 1005   CL 105 09/06/2021 1005   CO2 27 09/06/2021 1005   BUN 39 (H) 09/06/2021 1005   BUN 37 (A) 01/10/2021 0000   CREATININE 2.54 (H) 09/06/2021 1005   CREATININE 2.67 (H) 08/10/2019 1616   GLU 106 08/14/2020 0000   GLU 106 08/14/2020 0000      Component Value Date/Time   CALCIUM 8.8 09/06/2021 1005   ALKPHOS 67 12/13/2019 1032   AST 15 12/13/2019 1032   ALT 9 12/13/2019 1032   BILITOT 0.8 12/13/2019 1032     Lab Results  Component Value Date   WBC 6.5 09/06/2021   HGB  11.9 (L) 09/06/2021   HCT 35.7 (L) 09/06/2021   MCV 95.9 09/06/2021   PLT 141.0 (L) 09/06/2021   Lab Results  Component Value Date   IRON 25 (L) 07/17/2021   TIBC 285 07/17/2021   FERRITIN 49 07/17/2021   ASSESSMENT AND PLAN:  Discussed the following assessment and plan:  #1 low back pain, L1 vertebral fracture. Pain is resolved.  If he happens to have persistent recurrence of his pain greater than 6 weeks we can consider referral for consideration of kyphoplasty.  #2 chronic constipation.  Stable.  He is on a regimen of MiraLAX.  #3 hypertension, adequate control. Has had a problem with multiple medication intolerance.  Has chronic renal insufficiency stage IV. Continue Toprol-XL 50 mg a day, Imdur 60 mg a day, and doxazosin 8 mg a day.  #4 debilitated patient. Needs 24/7 care, needs assistant with most activities of daily living, is essentially wheelchair-bound. Family is in the process of seeking nursing home placement.  I discussed the assessment and treatment plan with the patient. The patient was provided an opportunity to ask questions and all were answered. The patient agreed with the plan and demonstrated an understanding of the instructions.   F/u: 3 mo  Signed:  Crissie Sickles, MD           01/24/2022

## 2022-02-05 ENCOUNTER — Encounter: Payer: Self-pay | Admitting: Family Medicine

## 2022-02-06 ENCOUNTER — Encounter: Payer: Self-pay | Admitting: Family Medicine

## 2022-02-06 NOTE — Telephone Encounter (Signed)
Would probably be best to discuss over a virtual visit

## 2022-02-07 ENCOUNTER — Telehealth (INDEPENDENT_AMBULATORY_CARE_PROVIDER_SITE_OTHER): Payer: Medicare Other | Admitting: Family Medicine

## 2022-02-07 ENCOUNTER — Encounter: Payer: Self-pay | Admitting: Family Medicine

## 2022-02-07 VITALS — BP 135/87 | HR 74

## 2022-02-07 DIAGNOSIS — F03B11 Unspecified dementia, moderate, with agitation: Secondary | ICD-10-CM

## 2022-02-07 DIAGNOSIS — I255 Ischemic cardiomyopathy: Secondary | ICD-10-CM | POA: Diagnosis not present

## 2022-02-07 NOTE — Progress Notes (Signed)
Virtual Visit via Video Note  I connected with pt and wife Gary Morgan on 02/07/22 at  1:40 PM EDT by a video enabled telemedicine application and verified that I am speaking with the correct person using two identifiers.  Location patient: [place] Location provider:work or home office Persons participating in the virtual visit: patient, provider  I discussed the limitations and requested verbal permission for telemedicine visit. The patient expressed understanding and agreed to proceed.  CC: memory concerns  HPI: 88 year old white male being seen today accompanied by his wife Gary Morgan for discussion of memory concerns. Gary Morgan continues to decline mentally and physically. He spent some time recently in a nursing home and did not do well.  Family ended up having to take him home. He has short-term memory problems, has having gradual progression of long-term memory problems, ask irritable and paranoid at times.   He requires assistance with all activities of daily living.  He can get into a wheelchair from bed only with full assistance.  He can get up from a wheelchair to standing with full assistance but is unable to walk any. Family is seeking admission to a memory care facility called--[facility] in [place].  ROS: See pertinent positives and negatives per HPI.  Past Medical History:  Diagnosis Date   AAA (abdominal aortic aneurysm) (HCC)    12/2021-> 3.5 cm.  Repeat 3 years.   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 Dois Davenport (urol).  Dr. Jeffie Pollock suspects this was due to stretch injury and has resolved as of 09/24/16.   Anemia 06/2021   blood loss (lower GI bleed) + anemia of chronic renal insufficiency   Arthritis of right wrist    s/p fracture of middle third of scaphoid bone sustained cleaning up from [facility] injection by Dr. Napoleon Form in [place] (ortho) made the pain go away.   BPH with obstruction/lower urinary tract symptoms    hx  of urinary retention (s/p foley cath when in hosp for urosepsis 04/2016).  +Areflexic bladder.  TURP will not help since he has acontractile bladder.  Options are CIC, indwelling urethral foley, or suprapubic catheter, HOLEP or simple prostatectomy.  HOLEP done 10/2019.   Cholelithiasis 06/2021   w/out cholecystitis   Chronic low back pain    Chronic renal insufficiency, stage 4 (severe) (HCC)    CrCl 30s (saw nephrologist in [place]). +microalbuminuria 04/2014 at PCP in [place].  Stage IV 2018: GFR upper 20s. Obstruc urop contrib, bilat hydroureteroneph->HoLEP 11/2019. Recur urin retent, GFR dec to about 20->to nephrol 2022.   COPD (chronic obstructive pulmonary disease) ([place])    on CXR 09/27/2015   Debilitated patient    Diabetes mellitus ([place])    2015 HbA1c 6.2-6.3 %.  HbA1c 06/2016 5.4%.  A1c 6.5% 05/2017 (this was his first A1c of 6.5% or more)--nutritionist referral recommended.   Diverticulosis 04/2016   Noted on CT 04/2016   Elevated PSA 06/2016   Dr. Redmond Pulling ([facility] urologist)-awaiting records (pt's wife reported pt's psa was 7).  Dr. Jeffie Pollock feels like this may have been a result of relatively recent episode of urinary retention, recheck PSA 12/29/16 was lower: 4.64, 24% f/t ratio, plan recheck 1 yr (Dr. Jeffie Pollock).    GI bleed 06/2021   colonic   History of anemia due to chronic kidney disease    History of pyelonephritis 04/2016   History of sepsis 04/2016   e coli   Hypertension    Ischemic cardiomyopathy 2023   Patient is a  poor cath candidate due to significant chronic renal insufficiency   Kidney stones    one episode   Recurrent UTI 2017   Renal cysts, acquired, bilateral    one dominant on R, Bosniak type II. (04/2016 CT)   Unstable gait    Urge incontinence     Past Surgical History:  Procedure Laterality Date   CATARACT EXTRACTION, BILATERAL     HOLEP-LASER ENUCLEATION OF THE PROSTATE WITH MORCELLATION N/A 11/18/2019   Path benign. Procedure: HOLEP-LASER  ENUCLEATION OF THE PROSTATE WITH MORCELLATION;  Surgeon: Billey Co, MD;  Location: [facility] ORS;  Service: Urology;  Laterality: N/A;   TONSILLECTOMY  1940   TRANSTHORACIC ECHOCARDIOGRAM  06/2021   EF 40-45%, diffuse LV hypokinesis   VENTRAL HERNIA REPAIR  2010   with strangulation of SB (approx 1 foot of SB had to be excised).  Had 3 total surgeries for this due to complications     Current Outpatient Medications:    aspirin EC 81 MG tablet, Take 81 mg by mouth daily. Swallow whole., Disp: , Rfl:    Cholecalciferol (VITAMIN D3 PO), Take 1,000 Units by mouth 3 (three) times a week., Disp: , Rfl:    doxazosin (CARDURA) 8 MG tablet, Take 1 tablet (8 mg total) by mouth daily., Disp: 90 tablet, Rfl: 1   ferrous sulfate 325 (65 FE) MG tablet, Take 325 mg by mouth daily., Disp: , Rfl:    HYDROcodone-acetaminophen (NORCO) 7.5-325 MG tablet, Take 1 tablet by mouth every 4 (four) hours as needed. (Patient not taking: Reported on 01/14/2022), Disp: , Rfl:    isosorbide mononitrate (IMDUR) 60 MG 24 hr tablet, Take 1 tablet (60 mg total) by mouth daily., Disp: 90 tablet, Rfl: 1   metoprolol succinate (TOPROL-XL) 50 MG 24 hr tablet, Take with or immediately following a meal., Disp: 90 tablet, Rfl: 3  EXAM:  VITALS per patient if applicable:     7/74/1287   11:02 AM 01/14/2022    9:46 AM 01/01/2022   11:06 AM  Vitals with BMI  Height   5\' 9"   Weight  159 lbs 159 lbs 3 oz  BMI  86.76 72.0  Systolic 947 096 283  Diastolic 91 68 80  Pulse 65 66 68   GENERAL: Sitting slouched in a wheelchair.  Awake.  Decreased attention.  He seems oriented to person and place as well as the situation in general.  Very hard of hearing. No further exam today.    Chemistry      Component Value Date/Time   NA 141 09/06/2021 1005   NA 138 08/14/2020 0000   NA 138 08/14/2020 0000   K 4.3 09/06/2021 1005   CL 105 09/06/2021 1005   CO2 27 09/06/2021 1005   BUN 39 (H) 09/06/2021 1005   BUN 37 (A) 01/10/2021 0000    CREATININE 2.54 (H) 09/06/2021 1005   CREATININE 2.67 (H) 08/10/2019 1616   GLU 106 08/14/2020 0000   GLU 106 08/14/2020 0000      Component Value Date/Time   CALCIUM 8.8 09/06/2021 1005   ALKPHOS 67 12/13/2019 1032   AST 15 12/13/2019 1032   ALT 9 12/13/2019 1032   BILITOT 0.8 12/13/2019 1032     ASSESSMENT AND PLAN:  Discussed the following assessment and plan:  Dementia with periodic agitation.  Requires full assistance with all activities of daily living.  Will assist with placement in memory care facility. No change in medications at this time except for the Vicodin, which  did not seem to help for his back pain.  Gary Morgan is giving Tylenol and this seems to be somewhat beneficial.  I discussed the assessment and treatment plan with the patient. The patient was provided an opportunity to ask questions and all were answered. The patient agreed with the plan and demonstrated an understanding of the instructions.   F/u: 1 month.  Signed:  Crissie Sickles, MD           02/07/2022

## 2022-02-07 NOTE — Telephone Encounter (Signed)
Pt advised virtual visit would be needed to discuss dementia/memory concerns in previous mychart message sent.

## 2022-02-11 ENCOUNTER — Telehealth: Payer: Self-pay | Admitting: Family Medicine

## 2022-02-11 DIAGNOSIS — Z111 Encounter for screening for respiratory tuberculosis: Secondary | ICD-10-CM | POA: Diagnosis not present

## 2022-02-11 NOTE — Telephone Encounter (Signed)
Patient is going into memory care and needs TB skin test can Dr. Anitra Lauth enter an order. Please advise [phone]

## 2022-02-12 NOTE — Telephone Encounter (Signed)
Pls order TB test

## 2022-02-12 NOTE — Telephone Encounter (Signed)
Spoke with patient's wife regarding results/recommendations. She states that they took pt to urgent care and had it done there.

## 2022-02-18 DIAGNOSIS — Z20822 Contact with and (suspected) exposure to covid-19: Secondary | ICD-10-CM | POA: Diagnosis not present

## 2022-02-20 ENCOUNTER — Telehealth: Payer: Self-pay | Admitting: Family Medicine

## 2022-02-20 NOTE — Telephone Encounter (Signed)
Orthoptist at [facility] called today in regards to [employer]. She reports that the [facility] forms needs to be corrected being as he is scheduled to move there today , she asked that the Dementia be listed as number one. The form can be faxed to [phone]  Phone number [phone]. She can also be reached on her cell at [phone].

## 2022-02-20 NOTE — Telephone Encounter (Signed)
Gary Morgan returned call and informed new form completed and will be faxed. She confirmed fax number. Form faxed again and confirmation received.

## 2022-02-20 NOTE — Telephone Encounter (Signed)
New form completed, tried to fax to number provided but line busy. LM for [place] regarding form completion.

## 2022-02-27 ENCOUNTER — Encounter: Payer: Self-pay | Admitting: Family Medicine

## 2022-02-28 NOTE — Telephone Encounter (Signed)
Gary Morgan can be reached on her cell at [phone] or  [phone]. During his last visit, provider gave ok for this.

## 2022-03-04 DIAGNOSIS — F03B0 Unspecified dementia, moderate, without behavioral disturbance, psychotic disturbance, mood disturbance, and anxiety: Secondary | ICD-10-CM | POA: Diagnosis not present

## 2022-03-04 NOTE — Telephone Encounter (Signed)
Noted  

## 2022-03-04 NOTE — Telephone Encounter (Signed)
Noted. It is fine for Gary Morgan to still see me but having a doctor at his facility in charge does make things more complicated for things such as medication recommendations or changes and for lab testing and things like that.   These are things that are best done by the physician at his facility in order to avoid confusion. I am still here for Gary Morgan and family to bounce things off of and help the best I can.

## 2022-03-04 NOTE — Telephone Encounter (Signed)
FYI: Tried to contact the facility twice but Caryl Pina or [employer] states he is seeing physician at the facility.

## 2022-03-17 ENCOUNTER — Encounter: Payer: Self-pay | Admitting: Family Medicine

## 2022-03-17 DIAGNOSIS — N138 Other obstructive and reflux uropathy: Secondary | ICD-10-CM | POA: Diagnosis not present

## 2022-03-17 DIAGNOSIS — I255 Ischemic cardiomyopathy: Secondary | ICD-10-CM | POA: Diagnosis not present

## 2022-03-17 DIAGNOSIS — I129 Hypertensive chronic kidney disease with stage 1 through stage 4 chronic kidney disease, or unspecified chronic kidney disease: Secondary | ICD-10-CM | POA: Diagnosis not present

## 2022-03-17 DIAGNOSIS — Z993 Dependence on wheelchair: Secondary | ICD-10-CM | POA: Diagnosis not present

## 2022-03-17 DIAGNOSIS — N401 Enlarged prostate with lower urinary tract symptoms: Secondary | ICD-10-CM | POA: Diagnosis not present

## 2022-03-17 DIAGNOSIS — F03B11 Unspecified dementia, moderate, with agitation: Secondary | ICD-10-CM | POA: Diagnosis not present

## 2022-03-17 DIAGNOSIS — Z9181 History of falling: Secondary | ICD-10-CM | POA: Diagnosis not present

## 2022-03-17 DIAGNOSIS — H9193 Unspecified hearing loss, bilateral: Secondary | ICD-10-CM | POA: Diagnosis not present

## 2022-03-17 DIAGNOSIS — N184 Chronic kidney disease, stage 4 (severe): Secondary | ICD-10-CM | POA: Diagnosis not present

## 2022-03-17 NOTE — Telephone Encounter (Signed)
Information added to first message sent to provider.

## 2022-03-18 NOTE — Telephone Encounter (Signed)
[place], advised to call back tomorrow to speak with [facility]

## 2022-03-20 DIAGNOSIS — S51011D Laceration without foreign body of right elbow, subsequent encounter: Secondary | ICD-10-CM | POA: Diagnosis not present

## 2022-03-20 DIAGNOSIS — M6281 Muscle weakness (generalized): Secondary | ICD-10-CM | POA: Diagnosis not present

## 2022-03-20 DIAGNOSIS — R269 Unspecified abnormalities of gait and mobility: Secondary | ICD-10-CM | POA: Diagnosis not present

## 2022-03-20 DIAGNOSIS — M545 Low back pain, unspecified: Secondary | ICD-10-CM | POA: Diagnosis not present

## 2022-03-20 DIAGNOSIS — F03B0 Unspecified dementia, moderate, without behavioral disturbance, psychotic disturbance, mood disturbance, and anxiety: Secondary | ICD-10-CM | POA: Diagnosis not present

## 2022-03-24 DIAGNOSIS — I255 Ischemic cardiomyopathy: Secondary | ICD-10-CM | POA: Diagnosis not present

## 2022-03-24 DIAGNOSIS — I129 Hypertensive chronic kidney disease with stage 1 through stage 4 chronic kidney disease, or unspecified chronic kidney disease: Secondary | ICD-10-CM | POA: Diagnosis not present

## 2022-03-24 DIAGNOSIS — N184 Chronic kidney disease, stage 4 (severe): Secondary | ICD-10-CM | POA: Diagnosis not present

## 2022-03-24 DIAGNOSIS — F03B11 Unspecified dementia, moderate, with agitation: Secondary | ICD-10-CM | POA: Diagnosis not present

## 2022-03-24 DIAGNOSIS — N401 Enlarged prostate with lower urinary tract symptoms: Secondary | ICD-10-CM | POA: Diagnosis not present

## 2022-03-24 DIAGNOSIS — N138 Other obstructive and reflux uropathy: Secondary | ICD-10-CM | POA: Diagnosis not present

## 2022-03-25 NOTE — Telephone Encounter (Signed)
FYI: Spoke with med tech, Ena Dawley and she said they would need a written order with the change in medications since last FL2 form received. His last visit with our office was 8/11 VV. No changes noted with iron or aspirin. Advised of provider note mentioning the provider at the facility should do most of patient's care regarding medication recommendations.

## 2022-03-26 DIAGNOSIS — F03B11 Unspecified dementia, moderate, with agitation: Secondary | ICD-10-CM | POA: Diagnosis not present

## 2022-03-26 DIAGNOSIS — N138 Other obstructive and reflux uropathy: Secondary | ICD-10-CM | POA: Diagnosis not present

## 2022-03-26 DIAGNOSIS — M79674 Pain in right toe(s): Secondary | ICD-10-CM | POA: Diagnosis not present

## 2022-03-26 DIAGNOSIS — I129 Hypertensive chronic kidney disease with stage 1 through stage 4 chronic kidney disease, or unspecified chronic kidney disease: Secondary | ICD-10-CM | POA: Diagnosis not present

## 2022-03-26 DIAGNOSIS — B351 Tinea unguium: Secondary | ICD-10-CM | POA: Diagnosis not present

## 2022-03-26 DIAGNOSIS — I255 Ischemic cardiomyopathy: Secondary | ICD-10-CM | POA: Diagnosis not present

## 2022-03-26 DIAGNOSIS — N184 Chronic kidney disease, stage 4 (severe): Secondary | ICD-10-CM | POA: Diagnosis not present

## 2022-03-26 DIAGNOSIS — M79675 Pain in left toe(s): Secondary | ICD-10-CM | POA: Diagnosis not present

## 2022-03-26 DIAGNOSIS — N401 Enlarged prostate with lower urinary tract symptoms: Secondary | ICD-10-CM | POA: Diagnosis not present

## 2022-03-26 NOTE — Telephone Encounter (Signed)
Rx written and put on Gary Morgan's keyboard.

## 2022-03-27 DIAGNOSIS — I129 Hypertensive chronic kidney disease with stage 1 through stage 4 chronic kidney disease, or unspecified chronic kidney disease: Secondary | ICD-10-CM | POA: Diagnosis not present

## 2022-03-27 DIAGNOSIS — S51011D Laceration without foreign body of right elbow, subsequent encounter: Secondary | ICD-10-CM | POA: Diagnosis not present

## 2022-03-27 DIAGNOSIS — N184 Chronic kidney disease, stage 4 (severe): Secondary | ICD-10-CM | POA: Diagnosis not present

## 2022-03-27 DIAGNOSIS — F03B0 Unspecified dementia, moderate, without behavioral disturbance, psychotic disturbance, mood disturbance, and anxiety: Secondary | ICD-10-CM | POA: Diagnosis not present

## 2022-03-27 DIAGNOSIS — M545 Low back pain, unspecified: Secondary | ICD-10-CM | POA: Diagnosis not present

## 2022-03-27 DIAGNOSIS — R269 Unspecified abnormalities of gait and mobility: Secondary | ICD-10-CM | POA: Diagnosis not present

## 2022-03-27 DIAGNOSIS — M6281 Muscle weakness (generalized): Secondary | ICD-10-CM | POA: Diagnosis not present

## 2022-03-27 DIAGNOSIS — F03B11 Unspecified dementia, moderate, with agitation: Secondary | ICD-10-CM | POA: Diagnosis not present

## 2022-03-31 DIAGNOSIS — N184 Chronic kidney disease, stage 4 (severe): Secondary | ICD-10-CM | POA: Diagnosis not present

## 2022-03-31 DIAGNOSIS — N138 Other obstructive and reflux uropathy: Secondary | ICD-10-CM | POA: Diagnosis not present

## 2022-03-31 DIAGNOSIS — I255 Ischemic cardiomyopathy: Secondary | ICD-10-CM | POA: Diagnosis not present

## 2022-03-31 DIAGNOSIS — N401 Enlarged prostate with lower urinary tract symptoms: Secondary | ICD-10-CM | POA: Diagnosis not present

## 2022-03-31 DIAGNOSIS — I129 Hypertensive chronic kidney disease with stage 1 through stage 4 chronic kidney disease, or unspecified chronic kidney disease: Secondary | ICD-10-CM | POA: Diagnosis not present

## 2022-03-31 DIAGNOSIS — F03B11 Unspecified dementia, moderate, with agitation: Secondary | ICD-10-CM | POA: Diagnosis not present

## 2022-04-01 ENCOUNTER — Telehealth: Payer: Self-pay | Admitting: *Deleted

## 2022-04-01 DIAGNOSIS — F02B0 Dementia in other diseases classified elsewhere, moderate, without behavioral disturbance, psychotic disturbance, mood disturbance, and anxiety: Secondary | ICD-10-CM | POA: Diagnosis not present

## 2022-04-01 DIAGNOSIS — G301 Alzheimer's disease with late onset: Secondary | ICD-10-CM | POA: Diagnosis not present

## 2022-04-01 NOTE — Patient Outreach (Signed)
  Care Coordination   Initial Visit Note   04/01/2022 Name: Gary Morgan MRN: [id number] DOB: [birth year]  Gary Morgan is a 88 y.o. year old male who sees McGowen, Adrian Blackwater, MD for primary care. I  spoke with the pt's spouse Marene Lenz.  What matters to the patients health and wellness today?  na    Goals Addressed   None     SDOH assessments and interventions completed:  No     Care Coordination Interventions Activated:  No  Care Coordination Interventions:  No, not indicated   Follow up plan: No further intervention required.   Encounter Outcome:  Pt. Refused -Spouse indicates pt resides at the [facility] facility.  Raina Mina, RN Care Management Coordinator [place] Office [phone]

## 2022-04-02 DIAGNOSIS — I255 Ischemic cardiomyopathy: Secondary | ICD-10-CM | POA: Diagnosis not present

## 2022-04-02 DIAGNOSIS — N401 Enlarged prostate with lower urinary tract symptoms: Secondary | ICD-10-CM | POA: Diagnosis not present

## 2022-04-02 DIAGNOSIS — N184 Chronic kidney disease, stage 4 (severe): Secondary | ICD-10-CM | POA: Diagnosis not present

## 2022-04-02 DIAGNOSIS — F03B11 Unspecified dementia, moderate, with agitation: Secondary | ICD-10-CM | POA: Diagnosis not present

## 2022-04-02 DIAGNOSIS — N138 Other obstructive and reflux uropathy: Secondary | ICD-10-CM | POA: Diagnosis not present

## 2022-04-02 DIAGNOSIS — I129 Hypertensive chronic kidney disease with stage 1 through stage 4 chronic kidney disease, or unspecified chronic kidney disease: Secondary | ICD-10-CM | POA: Diagnosis not present

## 2022-04-03 DIAGNOSIS — M6281 Muscle weakness (generalized): Secondary | ICD-10-CM | POA: Diagnosis not present

## 2022-04-03 DIAGNOSIS — M545 Low back pain, unspecified: Secondary | ICD-10-CM | POA: Diagnosis not present

## 2022-04-03 DIAGNOSIS — F02B0 Dementia in other diseases classified elsewhere, moderate, without behavioral disturbance, psychotic disturbance, mood disturbance, and anxiety: Secondary | ICD-10-CM | POA: Diagnosis not present

## 2022-04-03 DIAGNOSIS — R269 Unspecified abnormalities of gait and mobility: Secondary | ICD-10-CM | POA: Diagnosis not present

## 2022-04-09 DIAGNOSIS — N138 Other obstructive and reflux uropathy: Secondary | ICD-10-CM | POA: Diagnosis not present

## 2022-04-09 DIAGNOSIS — F03B11 Unspecified dementia, moderate, with agitation: Secondary | ICD-10-CM | POA: Diagnosis not present

## 2022-04-09 DIAGNOSIS — I129 Hypertensive chronic kidney disease with stage 1 through stage 4 chronic kidney disease, or unspecified chronic kidney disease: Secondary | ICD-10-CM | POA: Diagnosis not present

## 2022-04-09 DIAGNOSIS — N184 Chronic kidney disease, stage 4 (severe): Secondary | ICD-10-CM | POA: Diagnosis not present

## 2022-04-09 DIAGNOSIS — N401 Enlarged prostate with lower urinary tract symptoms: Secondary | ICD-10-CM | POA: Diagnosis not present

## 2022-04-09 DIAGNOSIS — I255 Ischemic cardiomyopathy: Secondary | ICD-10-CM | POA: Diagnosis not present

## 2022-04-15 DIAGNOSIS — I255 Ischemic cardiomyopathy: Secondary | ICD-10-CM | POA: Diagnosis not present

## 2022-04-15 DIAGNOSIS — R41841 Cognitive communication deficit: Secondary | ICD-10-CM | POA: Diagnosis not present

## 2022-04-15 DIAGNOSIS — Z741 Need for assistance with personal care: Secondary | ICD-10-CM | POA: Diagnosis not present

## 2022-04-15 DIAGNOSIS — M19031 Primary osteoarthritis, right wrist: Secondary | ICD-10-CM | POA: Diagnosis not present

## 2022-04-15 DIAGNOSIS — M25541 Pain in joints of right hand: Secondary | ICD-10-CM | POA: Diagnosis not present

## 2022-04-15 DIAGNOSIS — I10 Essential (primary) hypertension: Secondary | ICD-10-CM | POA: Diagnosis not present

## 2022-04-15 DIAGNOSIS — R296 Repeated falls: Secondary | ICD-10-CM | POA: Diagnosis not present

## 2022-04-15 DIAGNOSIS — M6281 Muscle weakness (generalized): Secondary | ICD-10-CM | POA: Diagnosis not present

## 2022-04-15 DIAGNOSIS — R1319 Other dysphagia: Secondary | ICD-10-CM | POA: Diagnosis not present

## 2022-04-16 DIAGNOSIS — Z741 Need for assistance with personal care: Secondary | ICD-10-CM | POA: Diagnosis not present

## 2022-04-16 DIAGNOSIS — M25541 Pain in joints of right hand: Secondary | ICD-10-CM | POA: Diagnosis not present

## 2022-04-16 DIAGNOSIS — I255 Ischemic cardiomyopathy: Secondary | ICD-10-CM | POA: Diagnosis not present

## 2022-04-16 DIAGNOSIS — R296 Repeated falls: Secondary | ICD-10-CM | POA: Diagnosis not present

## 2022-04-16 DIAGNOSIS — M19031 Primary osteoarthritis, right wrist: Secondary | ICD-10-CM | POA: Diagnosis not present

## 2022-04-16 DIAGNOSIS — M6281 Muscle weakness (generalized): Secondary | ICD-10-CM | POA: Diagnosis not present

## 2022-04-17 DIAGNOSIS — R296 Repeated falls: Secondary | ICD-10-CM | POA: Diagnosis not present

## 2022-04-17 DIAGNOSIS — M6281 Muscle weakness (generalized): Secondary | ICD-10-CM | POA: Diagnosis not present

## 2022-04-17 DIAGNOSIS — Z741 Need for assistance with personal care: Secondary | ICD-10-CM | POA: Diagnosis not present

## 2022-04-17 DIAGNOSIS — I255 Ischemic cardiomyopathy: Secondary | ICD-10-CM | POA: Diagnosis not present

## 2022-04-17 DIAGNOSIS — M19031 Primary osteoarthritis, right wrist: Secondary | ICD-10-CM | POA: Diagnosis not present

## 2022-04-17 DIAGNOSIS — M25541 Pain in joints of right hand: Secondary | ICD-10-CM | POA: Diagnosis not present

## 2022-04-18 DIAGNOSIS — M25541 Pain in joints of right hand: Secondary | ICD-10-CM | POA: Diagnosis not present

## 2022-04-18 DIAGNOSIS — R296 Repeated falls: Secondary | ICD-10-CM | POA: Diagnosis not present

## 2022-04-18 DIAGNOSIS — I255 Ischemic cardiomyopathy: Secondary | ICD-10-CM | POA: Diagnosis not present

## 2022-04-18 DIAGNOSIS — Z741 Need for assistance with personal care: Secondary | ICD-10-CM | POA: Diagnosis not present

## 2022-04-18 DIAGNOSIS — M6281 Muscle weakness (generalized): Secondary | ICD-10-CM | POA: Diagnosis not present

## 2022-04-18 DIAGNOSIS — M19031 Primary osteoarthritis, right wrist: Secondary | ICD-10-CM | POA: Diagnosis not present

## 2022-04-21 DIAGNOSIS — N401 Enlarged prostate with lower urinary tract symptoms: Secondary | ICD-10-CM | POA: Diagnosis not present

## 2022-04-21 DIAGNOSIS — M19031 Primary osteoarthritis, right wrist: Secondary | ICD-10-CM | POA: Diagnosis not present

## 2022-04-21 DIAGNOSIS — R5381 Other malaise: Secondary | ICD-10-CM | POA: Diagnosis not present

## 2022-04-21 DIAGNOSIS — N138 Other obstructive and reflux uropathy: Secondary | ICD-10-CM | POA: Diagnosis not present

## 2022-04-21 DIAGNOSIS — E1122 Type 2 diabetes mellitus with diabetic chronic kidney disease: Secondary | ICD-10-CM | POA: Diagnosis not present

## 2022-04-21 DIAGNOSIS — M25541 Pain in joints of right hand: Secondary | ICD-10-CM | POA: Diagnosis not present

## 2022-04-21 DIAGNOSIS — F03B11 Unspecified dementia, moderate, with agitation: Secondary | ICD-10-CM | POA: Diagnosis not present

## 2022-04-21 DIAGNOSIS — I129 Hypertensive chronic kidney disease with stage 1 through stage 4 chronic kidney disease, or unspecified chronic kidney disease: Secondary | ICD-10-CM | POA: Diagnosis not present

## 2022-04-21 DIAGNOSIS — M6281 Muscle weakness (generalized): Secondary | ICD-10-CM | POA: Diagnosis not present

## 2022-04-21 DIAGNOSIS — J449 Chronic obstructive pulmonary disease, unspecified: Secondary | ICD-10-CM | POA: Diagnosis not present

## 2022-04-21 DIAGNOSIS — R296 Repeated falls: Secondary | ICD-10-CM | POA: Diagnosis not present

## 2022-04-21 DIAGNOSIS — N184 Chronic kidney disease, stage 4 (severe): Secondary | ICD-10-CM | POA: Diagnosis not present

## 2022-04-21 DIAGNOSIS — Z66 Do not resuscitate: Secondary | ICD-10-CM | POA: Diagnosis not present

## 2022-04-21 DIAGNOSIS — R627 Adult failure to thrive: Secondary | ICD-10-CM | POA: Diagnosis not present

## 2022-04-21 DIAGNOSIS — K5900 Constipation, unspecified: Secondary | ICD-10-CM | POA: Diagnosis not present

## 2022-04-21 DIAGNOSIS — Z741 Need for assistance with personal care: Secondary | ICD-10-CM | POA: Diagnosis not present

## 2022-04-21 DIAGNOSIS — D631 Anemia in chronic kidney disease: Secondary | ICD-10-CM | POA: Diagnosis not present

## 2022-04-21 DIAGNOSIS — I255 Ischemic cardiomyopathy: Secondary | ICD-10-CM | POA: Diagnosis not present

## 2022-04-21 DIAGNOSIS — E559 Vitamin D deficiency, unspecified: Secondary | ICD-10-CM | POA: Diagnosis not present

## 2022-04-22 DIAGNOSIS — I255 Ischemic cardiomyopathy: Secondary | ICD-10-CM | POA: Diagnosis not present

## 2022-04-22 DIAGNOSIS — M25541 Pain in joints of right hand: Secondary | ICD-10-CM | POA: Diagnosis not present

## 2022-04-22 DIAGNOSIS — Z741 Need for assistance with personal care: Secondary | ICD-10-CM | POA: Diagnosis not present

## 2022-04-22 DIAGNOSIS — I10 Essential (primary) hypertension: Secondary | ICD-10-CM | POA: Diagnosis not present

## 2022-04-22 DIAGNOSIS — M19031 Primary osteoarthritis, right wrist: Secondary | ICD-10-CM | POA: Diagnosis not present

## 2022-04-22 DIAGNOSIS — R296 Repeated falls: Secondary | ICD-10-CM | POA: Diagnosis not present

## 2022-04-22 DIAGNOSIS — M6281 Muscle weakness (generalized): Secondary | ICD-10-CM | POA: Diagnosis not present

## 2022-04-23 DIAGNOSIS — R296 Repeated falls: Secondary | ICD-10-CM | POA: Diagnosis not present

## 2022-04-23 DIAGNOSIS — M25541 Pain in joints of right hand: Secondary | ICD-10-CM | POA: Diagnosis not present

## 2022-04-23 DIAGNOSIS — M19031 Primary osteoarthritis, right wrist: Secondary | ICD-10-CM | POA: Diagnosis not present

## 2022-04-23 DIAGNOSIS — R5383 Other fatigue: Secondary | ICD-10-CM | POA: Diagnosis not present

## 2022-04-23 DIAGNOSIS — M6281 Muscle weakness (generalized): Secondary | ICD-10-CM | POA: Diagnosis not present

## 2022-04-23 DIAGNOSIS — R531 Weakness: Secondary | ICD-10-CM | POA: Diagnosis not present

## 2022-04-23 DIAGNOSIS — Z741 Need for assistance with personal care: Secondary | ICD-10-CM | POA: Diagnosis not present

## 2022-04-23 DIAGNOSIS — I255 Ischemic cardiomyopathy: Secondary | ICD-10-CM | POA: Diagnosis not present

## 2022-04-23 DIAGNOSIS — R4182 Altered mental status, unspecified: Secondary | ICD-10-CM | POA: Diagnosis not present

## 2022-04-23 DIAGNOSIS — F03B11 Unspecified dementia, moderate, with agitation: Secondary | ICD-10-CM | POA: Diagnosis not present

## 2022-04-23 DIAGNOSIS — R0689 Other abnormalities of breathing: Secondary | ICD-10-CM | POA: Diagnosis not present

## 2022-04-23 DIAGNOSIS — N184 Chronic kidney disease, stage 4 (severe): Secondary | ICD-10-CM | POA: Diagnosis not present

## 2022-04-23 DIAGNOSIS — I10 Essential (primary) hypertension: Secondary | ICD-10-CM | POA: Diagnosis not present

## 2022-04-23 DIAGNOSIS — N401 Enlarged prostate with lower urinary tract symptoms: Secondary | ICD-10-CM | POA: Diagnosis not present

## 2022-04-23 DIAGNOSIS — I129 Hypertensive chronic kidney disease with stage 1 through stage 4 chronic kidney disease, or unspecified chronic kidney disease: Secondary | ICD-10-CM | POA: Diagnosis not present

## 2022-04-24 DIAGNOSIS — M25541 Pain in joints of right hand: Secondary | ICD-10-CM | POA: Diagnosis not present

## 2022-04-24 DIAGNOSIS — M6281 Muscle weakness (generalized): Secondary | ICD-10-CM | POA: Diagnosis not present

## 2022-04-24 DIAGNOSIS — M19031 Primary osteoarthritis, right wrist: Secondary | ICD-10-CM | POA: Diagnosis not present

## 2022-04-24 DIAGNOSIS — Z741 Need for assistance with personal care: Secondary | ICD-10-CM | POA: Diagnosis not present

## 2022-04-24 DIAGNOSIS — R296 Repeated falls: Secondary | ICD-10-CM | POA: Diagnosis not present

## 2022-04-24 DIAGNOSIS — I255 Ischemic cardiomyopathy: Secondary | ICD-10-CM | POA: Diagnosis not present

## 2022-04-25 ENCOUNTER — Ambulatory Visit: Payer: Medicare Other | Admitting: Family Medicine

## 2022-04-25 DIAGNOSIS — R296 Repeated falls: Secondary | ICD-10-CM | POA: Diagnosis not present

## 2022-04-25 DIAGNOSIS — Z741 Need for assistance with personal care: Secondary | ICD-10-CM | POA: Diagnosis not present

## 2022-04-25 DIAGNOSIS — M6281 Muscle weakness (generalized): Secondary | ICD-10-CM | POA: Diagnosis not present

## 2022-04-25 DIAGNOSIS — D72829 Elevated white blood cell count, unspecified: Secondary | ICD-10-CM | POA: Diagnosis not present

## 2022-04-25 DIAGNOSIS — M19031 Primary osteoarthritis, right wrist: Secondary | ICD-10-CM | POA: Diagnosis not present

## 2022-04-25 DIAGNOSIS — N390 Urinary tract infection, site not specified: Secondary | ICD-10-CM | POA: Diagnosis not present

## 2022-04-25 DIAGNOSIS — M25541 Pain in joints of right hand: Secondary | ICD-10-CM | POA: Diagnosis not present

## 2022-04-25 DIAGNOSIS — F0390 Unspecified dementia without behavioral disturbance: Secondary | ICD-10-CM | POA: Diagnosis not present

## 2022-04-25 DIAGNOSIS — I255 Ischemic cardiomyopathy: Secondary | ICD-10-CM | POA: Diagnosis not present

## 2022-04-26 DIAGNOSIS — Z741 Need for assistance with personal care: Secondary | ICD-10-CM | POA: Diagnosis not present

## 2022-04-26 DIAGNOSIS — M6281 Muscle weakness (generalized): Secondary | ICD-10-CM | POA: Diagnosis not present

## 2022-04-26 DIAGNOSIS — M25541 Pain in joints of right hand: Secondary | ICD-10-CM | POA: Diagnosis not present

## 2022-04-26 DIAGNOSIS — M19031 Primary osteoarthritis, right wrist: Secondary | ICD-10-CM | POA: Diagnosis not present

## 2022-04-26 DIAGNOSIS — R296 Repeated falls: Secondary | ICD-10-CM | POA: Diagnosis not present

## 2022-04-26 DIAGNOSIS — I255 Ischemic cardiomyopathy: Secondary | ICD-10-CM | POA: Diagnosis not present

## 2022-04-28 DIAGNOSIS — I129 Hypertensive chronic kidney disease with stage 1 through stage 4 chronic kidney disease, or unspecified chronic kidney disease: Secondary | ICD-10-CM | POA: Diagnosis not present

## 2022-04-28 DIAGNOSIS — Z741 Need for assistance with personal care: Secondary | ICD-10-CM | POA: Diagnosis not present

## 2022-04-28 DIAGNOSIS — M6281 Muscle weakness (generalized): Secondary | ICD-10-CM | POA: Diagnosis not present

## 2022-04-28 DIAGNOSIS — I255 Ischemic cardiomyopathy: Secondary | ICD-10-CM | POA: Diagnosis not present

## 2022-04-28 DIAGNOSIS — R296 Repeated falls: Secondary | ICD-10-CM | POA: Diagnosis not present

## 2022-04-28 DIAGNOSIS — M19031 Primary osteoarthritis, right wrist: Secondary | ICD-10-CM | POA: Diagnosis not present

## 2022-04-28 DIAGNOSIS — G301 Alzheimer's disease with late onset: Secondary | ICD-10-CM | POA: Diagnosis not present

## 2022-04-28 DIAGNOSIS — M25541 Pain in joints of right hand: Secondary | ICD-10-CM | POA: Diagnosis not present

## 2022-04-29 DIAGNOSIS — M6281 Muscle weakness (generalized): Secondary | ICD-10-CM | POA: Diagnosis not present

## 2022-04-29 DIAGNOSIS — N184 Chronic kidney disease, stage 4 (severe): Secondary | ICD-10-CM | POA: Diagnosis not present

## 2022-04-29 DIAGNOSIS — M25541 Pain in joints of right hand: Secondary | ICD-10-CM | POA: Diagnosis not present

## 2022-04-29 DIAGNOSIS — N390 Urinary tract infection, site not specified: Secondary | ICD-10-CM | POA: Diagnosis not present

## 2022-04-29 DIAGNOSIS — M19031 Primary osteoarthritis, right wrist: Secondary | ICD-10-CM | POA: Diagnosis not present

## 2022-04-29 DIAGNOSIS — I255 Ischemic cardiomyopathy: Secondary | ICD-10-CM | POA: Diagnosis not present

## 2022-04-29 DIAGNOSIS — Z741 Need for assistance with personal care: Secondary | ICD-10-CM | POA: Diagnosis not present

## 2022-04-29 DIAGNOSIS — R296 Repeated falls: Secondary | ICD-10-CM | POA: Diagnosis not present

## 2022-04-30 DIAGNOSIS — N189 Chronic kidney disease, unspecified: Secondary | ICD-10-CM | POA: Diagnosis not present

## 2022-04-30 DIAGNOSIS — Z741 Need for assistance with personal care: Secondary | ICD-10-CM | POA: Diagnosis not present

## 2022-04-30 DIAGNOSIS — M25541 Pain in joints of right hand: Secondary | ICD-10-CM | POA: Diagnosis not present

## 2022-04-30 DIAGNOSIS — M19031 Primary osteoarthritis, right wrist: Secondary | ICD-10-CM | POA: Diagnosis not present

## 2022-04-30 DIAGNOSIS — F0390 Unspecified dementia without behavioral disturbance: Secondary | ICD-10-CM | POA: Diagnosis not present

## 2022-04-30 DIAGNOSIS — I129 Hypertensive chronic kidney disease with stage 1 through stage 4 chronic kidney disease, or unspecified chronic kidney disease: Secondary | ICD-10-CM | POA: Diagnosis not present

## 2022-04-30 DIAGNOSIS — R296 Repeated falls: Secondary | ICD-10-CM | POA: Diagnosis not present

## 2022-04-30 DIAGNOSIS — R41841 Cognitive communication deficit: Secondary | ICD-10-CM | POA: Diagnosis not present

## 2022-04-30 DIAGNOSIS — R1319 Other dysphagia: Secondary | ICD-10-CM | POA: Diagnosis not present

## 2022-04-30 DIAGNOSIS — D631 Anemia in chronic kidney disease: Secondary | ICD-10-CM | POA: Diagnosis not present

## 2022-04-30 DIAGNOSIS — I255 Ischemic cardiomyopathy: Secondary | ICD-10-CM | POA: Diagnosis not present

## 2022-04-30 DIAGNOSIS — M6281 Muscle weakness (generalized): Secondary | ICD-10-CM | POA: Diagnosis not present

## 2022-05-01 DIAGNOSIS — R296 Repeated falls: Secondary | ICD-10-CM | POA: Diagnosis not present

## 2022-05-01 DIAGNOSIS — Z741 Need for assistance with personal care: Secondary | ICD-10-CM | POA: Diagnosis not present

## 2022-05-01 DIAGNOSIS — M6281 Muscle weakness (generalized): Secondary | ICD-10-CM | POA: Diagnosis not present

## 2022-05-01 DIAGNOSIS — I255 Ischemic cardiomyopathy: Secondary | ICD-10-CM | POA: Diagnosis not present

## 2022-05-01 DIAGNOSIS — M19031 Primary osteoarthritis, right wrist: Secondary | ICD-10-CM | POA: Diagnosis not present

## 2022-05-01 DIAGNOSIS — M25541 Pain in joints of right hand: Secondary | ICD-10-CM | POA: Diagnosis not present

## 2022-05-02 DIAGNOSIS — M19031 Primary osteoarthritis, right wrist: Secondary | ICD-10-CM | POA: Diagnosis not present

## 2022-05-02 DIAGNOSIS — M25541 Pain in joints of right hand: Secondary | ICD-10-CM | POA: Diagnosis not present

## 2022-05-02 DIAGNOSIS — M6281 Muscle weakness (generalized): Secondary | ICD-10-CM | POA: Diagnosis not present

## 2022-05-02 DIAGNOSIS — R296 Repeated falls: Secondary | ICD-10-CM | POA: Diagnosis not present

## 2022-05-02 DIAGNOSIS — Z741 Need for assistance with personal care: Secondary | ICD-10-CM | POA: Diagnosis not present

## 2022-05-02 DIAGNOSIS — I255 Ischemic cardiomyopathy: Secondary | ICD-10-CM | POA: Diagnosis not present

## 2022-05-03 DIAGNOSIS — Z741 Need for assistance with personal care: Secondary | ICD-10-CM | POA: Diagnosis not present

## 2022-05-03 DIAGNOSIS — M19031 Primary osteoarthritis, right wrist: Secondary | ICD-10-CM | POA: Diagnosis not present

## 2022-05-03 DIAGNOSIS — R296 Repeated falls: Secondary | ICD-10-CM | POA: Diagnosis not present

## 2022-05-03 DIAGNOSIS — I255 Ischemic cardiomyopathy: Secondary | ICD-10-CM | POA: Diagnosis not present

## 2022-05-03 DIAGNOSIS — M6281 Muscle weakness (generalized): Secondary | ICD-10-CM | POA: Diagnosis not present

## 2022-05-03 DIAGNOSIS — M25541 Pain in joints of right hand: Secondary | ICD-10-CM | POA: Diagnosis not present

## 2022-05-05 DIAGNOSIS — M19031 Primary osteoarthritis, right wrist: Secondary | ICD-10-CM | POA: Diagnosis not present

## 2022-05-05 DIAGNOSIS — Z741 Need for assistance with personal care: Secondary | ICD-10-CM | POA: Diagnosis not present

## 2022-05-05 DIAGNOSIS — R296 Repeated falls: Secondary | ICD-10-CM | POA: Diagnosis not present

## 2022-05-05 DIAGNOSIS — M25541 Pain in joints of right hand: Secondary | ICD-10-CM | POA: Diagnosis not present

## 2022-05-05 DIAGNOSIS — M6281 Muscle weakness (generalized): Secondary | ICD-10-CM | POA: Diagnosis not present

## 2022-05-05 DIAGNOSIS — I255 Ischemic cardiomyopathy: Secondary | ICD-10-CM | POA: Diagnosis not present

## 2022-05-06 DIAGNOSIS — M19031 Primary osteoarthritis, right wrist: Secondary | ICD-10-CM | POA: Diagnosis not present

## 2022-05-06 DIAGNOSIS — R296 Repeated falls: Secondary | ICD-10-CM | POA: Diagnosis not present

## 2022-05-06 DIAGNOSIS — M6281 Muscle weakness (generalized): Secondary | ICD-10-CM | POA: Diagnosis not present

## 2022-05-06 DIAGNOSIS — Z741 Need for assistance with personal care: Secondary | ICD-10-CM | POA: Diagnosis not present

## 2022-05-06 DIAGNOSIS — M25541 Pain in joints of right hand: Secondary | ICD-10-CM | POA: Diagnosis not present

## 2022-05-06 DIAGNOSIS — I255 Ischemic cardiomyopathy: Secondary | ICD-10-CM | POA: Diagnosis not present

## 2022-05-07 DIAGNOSIS — Z741 Need for assistance with personal care: Secondary | ICD-10-CM | POA: Diagnosis not present

## 2022-05-07 DIAGNOSIS — M25541 Pain in joints of right hand: Secondary | ICD-10-CM | POA: Diagnosis not present

## 2022-05-07 DIAGNOSIS — M19031 Primary osteoarthritis, right wrist: Secondary | ICD-10-CM | POA: Diagnosis not present

## 2022-05-07 DIAGNOSIS — M6281 Muscle weakness (generalized): Secondary | ICD-10-CM | POA: Diagnosis not present

## 2022-05-07 DIAGNOSIS — R296 Repeated falls: Secondary | ICD-10-CM | POA: Diagnosis not present

## 2022-05-07 DIAGNOSIS — I255 Ischemic cardiomyopathy: Secondary | ICD-10-CM | POA: Diagnosis not present

## 2022-05-08 DIAGNOSIS — I255 Ischemic cardiomyopathy: Secondary | ICD-10-CM | POA: Diagnosis not present

## 2022-05-08 DIAGNOSIS — Z741 Need for assistance with personal care: Secondary | ICD-10-CM | POA: Diagnosis not present

## 2022-05-08 DIAGNOSIS — M19031 Primary osteoarthritis, right wrist: Secondary | ICD-10-CM | POA: Diagnosis not present

## 2022-05-08 DIAGNOSIS — M25541 Pain in joints of right hand: Secondary | ICD-10-CM | POA: Diagnosis not present

## 2022-05-08 DIAGNOSIS — M6281 Muscle weakness (generalized): Secondary | ICD-10-CM | POA: Diagnosis not present

## 2022-05-08 DIAGNOSIS — R296 Repeated falls: Secondary | ICD-10-CM | POA: Diagnosis not present

## 2022-05-09 DIAGNOSIS — I129 Hypertensive chronic kidney disease with stage 1 through stage 4 chronic kidney disease, or unspecified chronic kidney disease: Secondary | ICD-10-CM | POA: Diagnosis not present

## 2022-05-09 DIAGNOSIS — M25541 Pain in joints of right hand: Secondary | ICD-10-CM | POA: Diagnosis not present

## 2022-05-09 DIAGNOSIS — R296 Repeated falls: Secondary | ICD-10-CM | POA: Diagnosis not present

## 2022-05-09 DIAGNOSIS — F0390 Unspecified dementia without behavioral disturbance: Secondary | ICD-10-CM | POA: Diagnosis not present

## 2022-05-09 DIAGNOSIS — N401 Enlarged prostate with lower urinary tract symptoms: Secondary | ICD-10-CM | POA: Diagnosis not present

## 2022-05-09 DIAGNOSIS — M19031 Primary osteoarthritis, right wrist: Secondary | ICD-10-CM | POA: Diagnosis not present

## 2022-05-09 DIAGNOSIS — M6281 Muscle weakness (generalized): Secondary | ICD-10-CM | POA: Diagnosis not present

## 2022-05-09 DIAGNOSIS — D649 Anemia, unspecified: Secondary | ICD-10-CM | POA: Diagnosis not present

## 2022-05-09 DIAGNOSIS — N189 Chronic kidney disease, unspecified: Secondary | ICD-10-CM | POA: Diagnosis not present

## 2022-05-09 DIAGNOSIS — Z741 Need for assistance with personal care: Secondary | ICD-10-CM | POA: Diagnosis not present

## 2022-05-09 DIAGNOSIS — I255 Ischemic cardiomyopathy: Secondary | ICD-10-CM | POA: Diagnosis not present

## 2022-05-12 DIAGNOSIS — R296 Repeated falls: Secondary | ICD-10-CM | POA: Diagnosis not present

## 2022-05-12 DIAGNOSIS — Z741 Need for assistance with personal care: Secondary | ICD-10-CM | POA: Diagnosis not present

## 2022-05-12 DIAGNOSIS — M19031 Primary osteoarthritis, right wrist: Secondary | ICD-10-CM | POA: Diagnosis not present

## 2022-05-12 DIAGNOSIS — M25541 Pain in joints of right hand: Secondary | ICD-10-CM | POA: Diagnosis not present

## 2022-05-12 DIAGNOSIS — M6281 Muscle weakness (generalized): Secondary | ICD-10-CM | POA: Diagnosis not present

## 2022-05-12 DIAGNOSIS — I255 Ischemic cardiomyopathy: Secondary | ICD-10-CM | POA: Diagnosis not present

## 2022-05-13 DIAGNOSIS — R296 Repeated falls: Secondary | ICD-10-CM | POA: Diagnosis not present

## 2022-05-13 DIAGNOSIS — M19031 Primary osteoarthritis, right wrist: Secondary | ICD-10-CM | POA: Diagnosis not present

## 2022-05-13 DIAGNOSIS — M6281 Muscle weakness (generalized): Secondary | ICD-10-CM | POA: Diagnosis not present

## 2022-05-13 DIAGNOSIS — Z741 Need for assistance with personal care: Secondary | ICD-10-CM | POA: Diagnosis not present

## 2022-05-13 DIAGNOSIS — N184 Chronic kidney disease, stage 4 (severe): Secondary | ICD-10-CM | POA: Diagnosis not present

## 2022-05-13 DIAGNOSIS — M25541 Pain in joints of right hand: Secondary | ICD-10-CM | POA: Diagnosis not present

## 2022-05-13 DIAGNOSIS — I255 Ischemic cardiomyopathy: Secondary | ICD-10-CM | POA: Diagnosis not present

## 2022-05-14 DIAGNOSIS — I255 Ischemic cardiomyopathy: Secondary | ICD-10-CM | POA: Diagnosis not present

## 2022-05-14 DIAGNOSIS — M6281 Muscle weakness (generalized): Secondary | ICD-10-CM | POA: Diagnosis not present

## 2022-05-14 DIAGNOSIS — R296 Repeated falls: Secondary | ICD-10-CM | POA: Diagnosis not present

## 2022-05-14 DIAGNOSIS — M19031 Primary osteoarthritis, right wrist: Secondary | ICD-10-CM | POA: Diagnosis not present

## 2022-05-14 DIAGNOSIS — M25541 Pain in joints of right hand: Secondary | ICD-10-CM | POA: Diagnosis not present

## 2022-05-14 DIAGNOSIS — Z741 Need for assistance with personal care: Secondary | ICD-10-CM | POA: Diagnosis not present

## 2022-05-15 DIAGNOSIS — Z741 Need for assistance with personal care: Secondary | ICD-10-CM | POA: Diagnosis not present

## 2022-05-15 DIAGNOSIS — M19031 Primary osteoarthritis, right wrist: Secondary | ICD-10-CM | POA: Diagnosis not present

## 2022-05-15 DIAGNOSIS — M25541 Pain in joints of right hand: Secondary | ICD-10-CM | POA: Diagnosis not present

## 2022-05-15 DIAGNOSIS — R296 Repeated falls: Secondary | ICD-10-CM | POA: Diagnosis not present

## 2022-05-15 DIAGNOSIS — M6281 Muscle weakness (generalized): Secondary | ICD-10-CM | POA: Diagnosis not present

## 2022-05-15 DIAGNOSIS — I255 Ischemic cardiomyopathy: Secondary | ICD-10-CM | POA: Diagnosis not present

## 2022-05-16 DIAGNOSIS — M6281 Muscle weakness (generalized): Secondary | ICD-10-CM | POA: Diagnosis not present

## 2022-05-16 DIAGNOSIS — F0390 Unspecified dementia without behavioral disturbance: Secondary | ICD-10-CM | POA: Diagnosis not present

## 2022-05-16 DIAGNOSIS — Z66 Do not resuscitate: Secondary | ICD-10-CM | POA: Diagnosis not present

## 2022-05-16 DIAGNOSIS — Z741 Need for assistance with personal care: Secondary | ICD-10-CM | POA: Diagnosis not present

## 2022-05-16 DIAGNOSIS — I129 Hypertensive chronic kidney disease with stage 1 through stage 4 chronic kidney disease, or unspecified chronic kidney disease: Secondary | ICD-10-CM | POA: Diagnosis not present

## 2022-05-16 DIAGNOSIS — M19031 Primary osteoarthritis, right wrist: Secondary | ICD-10-CM | POA: Diagnosis not present

## 2022-05-16 DIAGNOSIS — M25541 Pain in joints of right hand: Secondary | ICD-10-CM | POA: Diagnosis not present

## 2022-05-16 DIAGNOSIS — N189 Chronic kidney disease, unspecified: Secondary | ICD-10-CM | POA: Diagnosis not present

## 2022-05-16 DIAGNOSIS — J449 Chronic obstructive pulmonary disease, unspecified: Secondary | ICD-10-CM | POA: Diagnosis not present

## 2022-05-16 DIAGNOSIS — I255 Ischemic cardiomyopathy: Secondary | ICD-10-CM | POA: Diagnosis not present

## 2022-05-16 DIAGNOSIS — R296 Repeated falls: Secondary | ICD-10-CM | POA: Diagnosis not present

## 2022-05-17 DIAGNOSIS — R918 Other nonspecific abnormal finding of lung field: Secondary | ICD-10-CM | POA: Diagnosis not present

## 2022-05-18 DIAGNOSIS — J449 Chronic obstructive pulmonary disease, unspecified: Secondary | ICD-10-CM | POA: Diagnosis not present

## 2022-05-18 DIAGNOSIS — M19031 Primary osteoarthritis, right wrist: Secondary | ICD-10-CM | POA: Diagnosis not present

## 2022-05-18 DIAGNOSIS — M25541 Pain in joints of right hand: Secondary | ICD-10-CM | POA: Diagnosis not present

## 2022-05-18 DIAGNOSIS — Z741 Need for assistance with personal care: Secondary | ICD-10-CM | POA: Diagnosis not present

## 2022-05-18 DIAGNOSIS — M6281 Muscle weakness (generalized): Secondary | ICD-10-CM | POA: Diagnosis not present

## 2022-05-18 DIAGNOSIS — R296 Repeated falls: Secondary | ICD-10-CM | POA: Diagnosis not present

## 2022-05-18 DIAGNOSIS — I255 Ischemic cardiomyopathy: Secondary | ICD-10-CM | POA: Diagnosis not present

## 2022-05-19 DIAGNOSIS — F0390 Unspecified dementia without behavioral disturbance: Secondary | ICD-10-CM | POA: Diagnosis not present

## 2022-05-19 DIAGNOSIS — I509 Heart failure, unspecified: Secondary | ICD-10-CM | POA: Diagnosis not present

## 2022-05-19 DIAGNOSIS — N184 Chronic kidney disease, stage 4 (severe): Secondary | ICD-10-CM | POA: Diagnosis not present

## 2022-05-19 DIAGNOSIS — J189 Pneumonia, unspecified organism: Secondary | ICD-10-CM | POA: Diagnosis not present

## 2022-05-19 DIAGNOSIS — Z66 Do not resuscitate: Secondary | ICD-10-CM | POA: Diagnosis not present

## 2022-05-20 DIAGNOSIS — M19031 Primary osteoarthritis, right wrist: Secondary | ICD-10-CM | POA: Diagnosis not present

## 2022-05-20 DIAGNOSIS — I255 Ischemic cardiomyopathy: Secondary | ICD-10-CM | POA: Diagnosis not present

## 2022-05-20 DIAGNOSIS — Z741 Need for assistance with personal care: Secondary | ICD-10-CM | POA: Diagnosis not present

## 2022-05-20 DIAGNOSIS — M25541 Pain in joints of right hand: Secondary | ICD-10-CM | POA: Diagnosis not present

## 2022-05-20 DIAGNOSIS — R296 Repeated falls: Secondary | ICD-10-CM | POA: Diagnosis not present

## 2022-05-20 DIAGNOSIS — M6281 Muscle weakness (generalized): Secondary | ICD-10-CM | POA: Diagnosis not present

## 2022-05-21 DIAGNOSIS — I255 Ischemic cardiomyopathy: Secondary | ICD-10-CM | POA: Diagnosis not present

## 2022-05-21 DIAGNOSIS — M25541 Pain in joints of right hand: Secondary | ICD-10-CM | POA: Diagnosis not present

## 2022-05-21 DIAGNOSIS — M6281 Muscle weakness (generalized): Secondary | ICD-10-CM | POA: Diagnosis not present

## 2022-05-21 DIAGNOSIS — M19031 Primary osteoarthritis, right wrist: Secondary | ICD-10-CM | POA: Diagnosis not present

## 2022-05-21 DIAGNOSIS — R296 Repeated falls: Secondary | ICD-10-CM | POA: Diagnosis not present

## 2022-05-21 DIAGNOSIS — Z741 Need for assistance with personal care: Secondary | ICD-10-CM | POA: Diagnosis not present

## 2022-05-22 DIAGNOSIS — R296 Repeated falls: Secondary | ICD-10-CM | POA: Diagnosis not present

## 2022-05-22 DIAGNOSIS — Z741 Need for assistance with personal care: Secondary | ICD-10-CM | POA: Diagnosis not present

## 2022-05-22 DIAGNOSIS — M25541 Pain in joints of right hand: Secondary | ICD-10-CM | POA: Diagnosis not present

## 2022-05-22 DIAGNOSIS — M6281 Muscle weakness (generalized): Secondary | ICD-10-CM | POA: Diagnosis not present

## 2022-05-22 DIAGNOSIS — M19031 Primary osteoarthritis, right wrist: Secondary | ICD-10-CM | POA: Diagnosis not present

## 2022-05-22 DIAGNOSIS — I255 Ischemic cardiomyopathy: Secondary | ICD-10-CM | POA: Diagnosis not present

## 2022-05-23 DIAGNOSIS — M25541 Pain in joints of right hand: Secondary | ICD-10-CM | POA: Diagnosis not present

## 2022-05-23 DIAGNOSIS — Z741 Need for assistance with personal care: Secondary | ICD-10-CM | POA: Diagnosis not present

## 2022-05-23 DIAGNOSIS — M6281 Muscle weakness (generalized): Secondary | ICD-10-CM | POA: Diagnosis not present

## 2022-05-23 DIAGNOSIS — I255 Ischemic cardiomyopathy: Secondary | ICD-10-CM | POA: Diagnosis not present

## 2022-05-23 DIAGNOSIS — M19031 Primary osteoarthritis, right wrist: Secondary | ICD-10-CM | POA: Diagnosis not present

## 2022-05-23 DIAGNOSIS — R296 Repeated falls: Secondary | ICD-10-CM | POA: Diagnosis not present

## 2022-05-26 DIAGNOSIS — M19031 Primary osteoarthritis, right wrist: Secondary | ICD-10-CM | POA: Diagnosis not present

## 2022-05-26 DIAGNOSIS — M25541 Pain in joints of right hand: Secondary | ICD-10-CM | POA: Diagnosis not present

## 2022-05-26 DIAGNOSIS — R296 Repeated falls: Secondary | ICD-10-CM | POA: Diagnosis not present

## 2022-05-26 DIAGNOSIS — M6281 Muscle weakness (generalized): Secondary | ICD-10-CM | POA: Diagnosis not present

## 2022-05-26 DIAGNOSIS — Z741 Need for assistance with personal care: Secondary | ICD-10-CM | POA: Diagnosis not present

## 2022-05-26 DIAGNOSIS — I255 Ischemic cardiomyopathy: Secondary | ICD-10-CM | POA: Diagnosis not present

## 2022-05-27 DIAGNOSIS — R296 Repeated falls: Secondary | ICD-10-CM | POA: Diagnosis not present

## 2022-05-27 DIAGNOSIS — M25541 Pain in joints of right hand: Secondary | ICD-10-CM | POA: Diagnosis not present

## 2022-05-27 DIAGNOSIS — M6281 Muscle weakness (generalized): Secondary | ICD-10-CM | POA: Diagnosis not present

## 2022-05-27 DIAGNOSIS — I255 Ischemic cardiomyopathy: Secondary | ICD-10-CM | POA: Diagnosis not present

## 2022-05-27 DIAGNOSIS — Z741 Need for assistance with personal care: Secondary | ICD-10-CM | POA: Diagnosis not present

## 2022-05-27 DIAGNOSIS — M19031 Primary osteoarthritis, right wrist: Secondary | ICD-10-CM | POA: Diagnosis not present

## 2022-05-28 DIAGNOSIS — Z66 Do not resuscitate: Secondary | ICD-10-CM | POA: Diagnosis not present

## 2022-05-28 DIAGNOSIS — I255 Ischemic cardiomyopathy: Secondary | ICD-10-CM | POA: Diagnosis not present

## 2022-05-28 DIAGNOSIS — M6281 Muscle weakness (generalized): Secondary | ICD-10-CM | POA: Diagnosis not present

## 2022-05-28 DIAGNOSIS — I509 Heart failure, unspecified: Secondary | ICD-10-CM | POA: Diagnosis not present

## 2022-05-28 DIAGNOSIS — M19031 Primary osteoarthritis, right wrist: Secondary | ICD-10-CM | POA: Diagnosis not present

## 2022-05-28 DIAGNOSIS — F03B11 Unspecified dementia, moderate, with agitation: Secondary | ICD-10-CM | POA: Diagnosis not present

## 2022-05-28 DIAGNOSIS — I130 Hypertensive heart and chronic kidney disease with heart failure and stage 1 through stage 4 chronic kidney disease, or unspecified chronic kidney disease: Secondary | ICD-10-CM | POA: Diagnosis not present

## 2022-05-28 DIAGNOSIS — N189 Chronic kidney disease, unspecified: Secondary | ICD-10-CM | POA: Diagnosis not present

## 2022-05-28 DIAGNOSIS — Z741 Need for assistance with personal care: Secondary | ICD-10-CM | POA: Diagnosis not present

## 2022-05-28 DIAGNOSIS — R296 Repeated falls: Secondary | ICD-10-CM | POA: Diagnosis not present

## 2022-05-28 DIAGNOSIS — M25541 Pain in joints of right hand: Secondary | ICD-10-CM | POA: Diagnosis not present

## 2022-05-28 DIAGNOSIS — D631 Anemia in chronic kidney disease: Secondary | ICD-10-CM | POA: Diagnosis not present

## 2022-05-29 DIAGNOSIS — M6281 Muscle weakness (generalized): Secondary | ICD-10-CM | POA: Diagnosis not present

## 2022-05-29 DIAGNOSIS — R296 Repeated falls: Secondary | ICD-10-CM | POA: Diagnosis not present

## 2022-05-29 DIAGNOSIS — Z741 Need for assistance with personal care: Secondary | ICD-10-CM | POA: Diagnosis not present

## 2022-05-29 DIAGNOSIS — N200 Calculus of kidney: Secondary | ICD-10-CM | POA: Diagnosis not present

## 2022-05-29 DIAGNOSIS — M25541 Pain in joints of right hand: Secondary | ICD-10-CM | POA: Diagnosis not present

## 2022-05-29 DIAGNOSIS — I255 Ischemic cardiomyopathy: Secondary | ICD-10-CM | POA: Diagnosis not present

## 2022-05-29 DIAGNOSIS — M19031 Primary osteoarthritis, right wrist: Secondary | ICD-10-CM | POA: Diagnosis not present

## 2022-05-30 DIAGNOSIS — J3489 Other specified disorders of nose and nasal sinuses: Secondary | ICD-10-CM | POA: Diagnosis not present

## 2022-05-30 DIAGNOSIS — R519 Headache, unspecified: Secondary | ICD-10-CM | POA: Diagnosis not present

## 2022-05-30 DIAGNOSIS — Z043 Encounter for examination and observation following other accident: Secondary | ICD-10-CM | POA: Diagnosis not present

## 2022-05-30 DIAGNOSIS — F03B11 Unspecified dementia, moderate, with agitation: Secondary | ICD-10-CM | POA: Diagnosis not present

## 2022-05-30 DIAGNOSIS — I63532 Cerebral infarction due to unspecified occlusion or stenosis of left posterior cerebral artery: Secondary | ICD-10-CM | POA: Diagnosis not present

## 2022-05-30 DIAGNOSIS — I080 Rheumatic disorders of both mitral and aortic valves: Secondary | ICD-10-CM | POA: Diagnosis not present

## 2022-05-30 DIAGNOSIS — Y998 Other external cause status: Secondary | ICD-10-CM | POA: Diagnosis not present

## 2022-05-30 DIAGNOSIS — I639 Cerebral infarction, unspecified: Secondary | ICD-10-CM | POA: Diagnosis not present

## 2022-05-30 DIAGNOSIS — I129 Hypertensive chronic kidney disease with stage 1 through stage 4 chronic kidney disease, or unspecified chronic kidney disease: Secondary | ICD-10-CM | POA: Diagnosis not present

## 2022-05-30 DIAGNOSIS — Z741 Need for assistance with personal care: Secondary | ICD-10-CM | POA: Diagnosis not present

## 2022-05-30 DIAGNOSIS — N184 Chronic kidney disease, stage 4 (severe): Secondary | ICD-10-CM | POA: Diagnosis not present

## 2022-05-30 DIAGNOSIS — R531 Weakness: Secondary | ICD-10-CM | POA: Diagnosis not present

## 2022-05-30 DIAGNOSIS — Z66 Do not resuscitate: Secondary | ICD-10-CM | POA: Diagnosis present

## 2022-05-30 DIAGNOSIS — S199XXA Unspecified injury of neck, initial encounter: Secondary | ICD-10-CM | POA: Diagnosis not present

## 2022-05-30 DIAGNOSIS — R41841 Cognitive communication deficit: Secondary | ICD-10-CM | POA: Diagnosis not present

## 2022-05-30 DIAGNOSIS — Z23 Encounter for immunization: Secondary | ICD-10-CM | POA: Diagnosis not present

## 2022-05-30 DIAGNOSIS — G319 Degenerative disease of nervous system, unspecified: Secondary | ICD-10-CM | POA: Diagnosis not present

## 2022-05-30 DIAGNOSIS — G4489 Other headache syndrome: Secondary | ICD-10-CM | POA: Diagnosis not present

## 2022-05-30 DIAGNOSIS — I236 Thrombosis of atrium, auricular appendage, and ventricle as current complications following acute myocardial infarction: Secondary | ICD-10-CM | POA: Diagnosis not present

## 2022-05-30 DIAGNOSIS — R4182 Altered mental status, unspecified: Secondary | ICD-10-CM | POA: Diagnosis not present

## 2022-05-30 DIAGNOSIS — I509 Heart failure, unspecified: Secondary | ICD-10-CM | POA: Diagnosis not present

## 2022-05-30 DIAGNOSIS — I130 Hypertensive heart and chronic kidney disease with heart failure and stage 1 through stage 4 chronic kidney disease, or unspecified chronic kidney disease: Secondary | ICD-10-CM | POA: Diagnosis not present

## 2022-05-30 DIAGNOSIS — M19031 Primary osteoarthritis, right wrist: Secondary | ICD-10-CM | POA: Diagnosis not present

## 2022-05-30 DIAGNOSIS — N189 Chronic kidney disease, unspecified: Secondary | ICD-10-CM | POA: Diagnosis not present

## 2022-05-30 DIAGNOSIS — F03918 Unspecified dementia, unspecified severity, with other behavioral disturbance: Secondary | ICD-10-CM | POA: Diagnosis not present

## 2022-05-30 DIAGNOSIS — M160 Bilateral primary osteoarthritis of hip: Secondary | ICD-10-CM | POA: Diagnosis not present

## 2022-05-30 DIAGNOSIS — I748 Embolism and thrombosis of other arteries: Secondary | ICD-10-CM | POA: Diagnosis not present

## 2022-05-30 DIAGNOSIS — M6281 Muscle weakness (generalized): Secondary | ICD-10-CM | POA: Diagnosis not present

## 2022-05-30 DIAGNOSIS — R296 Repeated falls: Secondary | ICD-10-CM | POA: Diagnosis not present

## 2022-05-30 DIAGNOSIS — S0181XA Laceration without foreign body of other part of head, initial encounter: Secondary | ICD-10-CM | POA: Diagnosis present

## 2022-05-30 DIAGNOSIS — W1789XA Other fall from one level to another, initial encounter: Secondary | ICD-10-CM | POA: Diagnosis not present

## 2022-05-30 DIAGNOSIS — I63332 Cerebral infarction due to thrombosis of left posterior cerebral artery: Secondary | ICD-10-CM | POA: Diagnosis not present

## 2022-05-30 DIAGNOSIS — D638 Anemia in other chronic diseases classified elsewhere: Secondary | ICD-10-CM | POA: Diagnosis not present

## 2022-05-30 DIAGNOSIS — F0390 Unspecified dementia without behavioral disturbance: Secondary | ICD-10-CM | POA: Diagnosis not present

## 2022-05-30 DIAGNOSIS — Z7401 Bed confinement status: Secondary | ICD-10-CM | POA: Diagnosis not present

## 2022-05-30 DIAGNOSIS — Z7902 Long term (current) use of antithrombotics/antiplatelets: Secondary | ICD-10-CM | POA: Diagnosis not present

## 2022-05-30 DIAGNOSIS — I255 Ischemic cardiomyopathy: Secondary | ICD-10-CM | POA: Diagnosis not present

## 2022-05-30 DIAGNOSIS — J449 Chronic obstructive pulmonary disease, unspecified: Secondary | ICD-10-CM | POA: Diagnosis not present

## 2022-05-30 DIAGNOSIS — M25541 Pain in joints of right hand: Secondary | ICD-10-CM | POA: Diagnosis not present

## 2022-05-30 DIAGNOSIS — S0990XA Unspecified injury of head, initial encounter: Secondary | ICD-10-CM | POA: Diagnosis not present

## 2022-05-30 DIAGNOSIS — Z7982 Long term (current) use of aspirin: Secondary | ICD-10-CM | POA: Diagnosis not present

## 2022-05-30 DIAGNOSIS — R001 Bradycardia, unspecified: Secondary | ICD-10-CM | POA: Diagnosis not present

## 2022-05-30 DIAGNOSIS — E1122 Type 2 diabetes mellitus with diabetic chronic kidney disease: Secondary | ICD-10-CM | POA: Diagnosis not present

## 2022-05-30 DIAGNOSIS — F0394 Unspecified dementia, unspecified severity, with anxiety: Secondary | ICD-10-CM | POA: Diagnosis not present

## 2022-05-30 DIAGNOSIS — H538 Other visual disturbances: Secondary | ICD-10-CM | POA: Diagnosis not present

## 2022-05-30 DIAGNOSIS — W19XXXA Unspecified fall, initial encounter: Secondary | ICD-10-CM | POA: Diagnosis not present

## 2022-05-30 DIAGNOSIS — S0181XD Laceration without foreign body of other part of head, subsequent encounter: Secondary | ICD-10-CM | POA: Diagnosis not present

## 2022-05-30 DIAGNOSIS — N400 Enlarged prostate without lower urinary tract symptoms: Secondary | ICD-10-CM | POA: Diagnosis not present

## 2022-05-30 DIAGNOSIS — Z8673 Personal history of transient ischemic attack (TIA), and cerebral infarction without residual deficits: Secondary | ICD-10-CM | POA: Diagnosis not present

## 2022-05-30 DIAGNOSIS — R29703 NIHSS score 3: Secondary | ICD-10-CM | POA: Diagnosis not present

## 2022-05-30 DIAGNOSIS — R1319 Other dysphagia: Secondary | ICD-10-CM | POA: Diagnosis not present

## 2022-06-03 DIAGNOSIS — I513 Intracardiac thrombosis, not elsewhere classified: Secondary | ICD-10-CM | POA: Diagnosis not present

## 2022-06-03 DIAGNOSIS — I509 Heart failure, unspecified: Secondary | ICD-10-CM | POA: Diagnosis not present

## 2022-06-03 DIAGNOSIS — Z66 Do not resuscitate: Secondary | ICD-10-CM | POA: Diagnosis not present

## 2022-06-03 DIAGNOSIS — I10 Essential (primary) hypertension: Secondary | ICD-10-CM | POA: Diagnosis not present

## 2022-06-03 DIAGNOSIS — F03B11 Unspecified dementia, moderate, with agitation: Secondary | ICD-10-CM | POA: Diagnosis not present

## 2022-06-03 DIAGNOSIS — J449 Chronic obstructive pulmonary disease, unspecified: Secondary | ICD-10-CM | POA: Diagnosis not present

## 2022-06-03 DIAGNOSIS — F0390 Unspecified dementia without behavioral disturbance: Secondary | ICD-10-CM | POA: Diagnosis not present

## 2022-06-03 DIAGNOSIS — I748 Embolism and thrombosis of other arteries: Secondary | ICD-10-CM | POA: Diagnosis not present

## 2022-06-03 DIAGNOSIS — R296 Repeated falls: Secondary | ICD-10-CM | POA: Diagnosis not present

## 2022-06-03 DIAGNOSIS — Z7401 Bed confinement status: Secondary | ICD-10-CM | POA: Diagnosis not present

## 2022-06-03 DIAGNOSIS — K219 Gastro-esophageal reflux disease without esophagitis: Secondary | ICD-10-CM | POA: Diagnosis not present

## 2022-06-03 DIAGNOSIS — E119 Type 2 diabetes mellitus without complications: Secondary | ICD-10-CM | POA: Diagnosis not present

## 2022-06-03 DIAGNOSIS — N401 Enlarged prostate with lower urinary tract symptoms: Secondary | ICD-10-CM | POA: Diagnosis not present

## 2022-06-03 DIAGNOSIS — I63532 Cerebral infarction due to unspecified occlusion or stenosis of left posterior cerebral artery: Secondary | ICD-10-CM | POA: Diagnosis not present

## 2022-06-03 DIAGNOSIS — I236 Thrombosis of atrium, auricular appendage, and ventricle as current complications following acute myocardial infarction: Secondary | ICD-10-CM | POA: Diagnosis not present

## 2022-06-03 DIAGNOSIS — E1122 Type 2 diabetes mellitus with diabetic chronic kidney disease: Secondary | ICD-10-CM | POA: Diagnosis not present

## 2022-06-03 DIAGNOSIS — J811 Chronic pulmonary edema: Secondary | ICD-10-CM | POA: Diagnosis not present

## 2022-06-03 DIAGNOSIS — H538 Other visual disturbances: Secondary | ICD-10-CM | POA: Diagnosis not present

## 2022-06-03 DIAGNOSIS — M160 Bilateral primary osteoarthritis of hip: Secondary | ICD-10-CM | POA: Diagnosis not present

## 2022-06-03 DIAGNOSIS — F0394 Unspecified dementia, unspecified severity, with anxiety: Secondary | ICD-10-CM | POA: Diagnosis not present

## 2022-06-03 DIAGNOSIS — I130 Hypertensive heart and chronic kidney disease with heart failure and stage 1 through stage 4 chronic kidney disease, or unspecified chronic kidney disease: Secondary | ICD-10-CM | POA: Diagnosis not present

## 2022-06-03 DIAGNOSIS — S0181XD Laceration without foreign body of other part of head, subsequent encounter: Secondary | ICD-10-CM | POA: Diagnosis not present

## 2022-06-03 DIAGNOSIS — R5381 Other malaise: Secondary | ICD-10-CM | POA: Diagnosis not present

## 2022-06-03 DIAGNOSIS — W19XXXA Unspecified fall, initial encounter: Secondary | ICD-10-CM | POA: Diagnosis not present

## 2022-06-03 DIAGNOSIS — U071 COVID-19: Secondary | ICD-10-CM | POA: Diagnosis not present

## 2022-06-03 DIAGNOSIS — I639 Cerebral infarction, unspecified: Secondary | ICD-10-CM | POA: Diagnosis not present

## 2022-06-03 DIAGNOSIS — M19031 Primary osteoarthritis, right wrist: Secondary | ICD-10-CM | POA: Diagnosis not present

## 2022-06-03 DIAGNOSIS — F419 Anxiety disorder, unspecified: Secondary | ICD-10-CM | POA: Diagnosis not present

## 2022-06-03 DIAGNOSIS — D631 Anemia in chronic kidney disease: Secondary | ICD-10-CM | POA: Diagnosis not present

## 2022-06-03 DIAGNOSIS — R531 Weakness: Secondary | ICD-10-CM | POA: Diagnosis not present

## 2022-06-03 DIAGNOSIS — Z8673 Personal history of transient ischemic attack (TIA), and cerebral infarction without residual deficits: Secondary | ICD-10-CM | POA: Diagnosis not present

## 2022-06-03 DIAGNOSIS — E559 Vitamin D deficiency, unspecified: Secondary | ICD-10-CM | POA: Diagnosis not present

## 2022-06-03 DIAGNOSIS — S0181XA Laceration without foreign body of other part of head, initial encounter: Secondary | ICD-10-CM | POA: Diagnosis not present

## 2022-06-03 DIAGNOSIS — K5900 Constipation, unspecified: Secondary | ICD-10-CM | POA: Diagnosis not present

## 2022-06-03 DIAGNOSIS — Z7902 Long term (current) use of antithrombotics/antiplatelets: Secondary | ICD-10-CM | POA: Diagnosis not present

## 2022-06-03 DIAGNOSIS — F03B18 Unspecified dementia, moderate, with other behavioral disturbance: Secondary | ICD-10-CM | POA: Diagnosis not present

## 2022-06-03 DIAGNOSIS — Z7982 Long term (current) use of aspirin: Secondary | ICD-10-CM | POA: Diagnosis not present

## 2022-06-03 DIAGNOSIS — I255 Ischemic cardiomyopathy: Secondary | ICD-10-CM | POA: Diagnosis not present

## 2022-06-03 DIAGNOSIS — N184 Chronic kidney disease, stage 4 (severe): Secondary | ICD-10-CM | POA: Diagnosis not present

## 2022-06-06 DIAGNOSIS — E559 Vitamin D deficiency, unspecified: Secondary | ICD-10-CM | POA: Diagnosis not present

## 2022-06-06 DIAGNOSIS — I255 Ischemic cardiomyopathy: Secondary | ICD-10-CM | POA: Diagnosis not present

## 2022-06-06 DIAGNOSIS — F03B18 Unspecified dementia, moderate, with other behavioral disturbance: Secondary | ICD-10-CM | POA: Diagnosis not present

## 2022-06-06 DIAGNOSIS — E1122 Type 2 diabetes mellitus with diabetic chronic kidney disease: Secondary | ICD-10-CM | POA: Diagnosis not present

## 2022-06-06 DIAGNOSIS — I130 Hypertensive heart and chronic kidney disease with heart failure and stage 1 through stage 4 chronic kidney disease, or unspecified chronic kidney disease: Secondary | ICD-10-CM | POA: Diagnosis not present

## 2022-06-06 DIAGNOSIS — D631 Anemia in chronic kidney disease: Secondary | ICD-10-CM | POA: Diagnosis not present

## 2022-06-06 DIAGNOSIS — F03B11 Unspecified dementia, moderate, with agitation: Secondary | ICD-10-CM | POA: Diagnosis not present

## 2022-06-06 DIAGNOSIS — Z7982 Long term (current) use of aspirin: Secondary | ICD-10-CM | POA: Diagnosis not present

## 2022-06-06 DIAGNOSIS — Z7902 Long term (current) use of antithrombotics/antiplatelets: Secondary | ICD-10-CM | POA: Diagnosis not present

## 2022-06-06 DIAGNOSIS — I513 Intracardiac thrombosis, not elsewhere classified: Secondary | ICD-10-CM | POA: Diagnosis not present

## 2022-06-06 DIAGNOSIS — I509 Heart failure, unspecified: Secondary | ICD-10-CM | POA: Diagnosis not present

## 2022-06-06 DIAGNOSIS — Z8673 Personal history of transient ischemic attack (TIA), and cerebral infarction without residual deficits: Secondary | ICD-10-CM | POA: Diagnosis not present

## 2022-06-06 DIAGNOSIS — R5381 Other malaise: Secondary | ICD-10-CM | POA: Diagnosis not present

## 2022-06-06 DIAGNOSIS — Z66 Do not resuscitate: Secondary | ICD-10-CM | POA: Diagnosis not present

## 2022-06-06 DIAGNOSIS — N184 Chronic kidney disease, stage 4 (severe): Secondary | ICD-10-CM | POA: Diagnosis not present

## 2022-06-06 DIAGNOSIS — K5900 Constipation, unspecified: Secondary | ICD-10-CM | POA: Diagnosis not present

## 2022-06-06 DIAGNOSIS — J449 Chronic obstructive pulmonary disease, unspecified: Secondary | ICD-10-CM | POA: Diagnosis not present

## 2022-06-06 DIAGNOSIS — M19031 Primary osteoarthritis, right wrist: Secondary | ICD-10-CM | POA: Diagnosis not present

## 2022-06-06 DIAGNOSIS — N401 Enlarged prostate with lower urinary tract symptoms: Secondary | ICD-10-CM | POA: Diagnosis not present

## 2022-06-11 DIAGNOSIS — F03B11 Unspecified dementia, moderate, with agitation: Secondary | ICD-10-CM | POA: Diagnosis not present

## 2022-06-11 DIAGNOSIS — N184 Chronic kidney disease, stage 4 (severe): Secondary | ICD-10-CM | POA: Diagnosis not present

## 2022-06-11 DIAGNOSIS — K219 Gastro-esophageal reflux disease without esophagitis: Secondary | ICD-10-CM | POA: Diagnosis not present

## 2022-06-27 DIAGNOSIS — E119 Type 2 diabetes mellitus without complications: Secondary | ICD-10-CM | POA: Diagnosis not present

## 2022-06-27 DIAGNOSIS — I10 Essential (primary) hypertension: Secondary | ICD-10-CM | POA: Diagnosis not present

## 2022-06-27 DIAGNOSIS — K5900 Constipation, unspecified: Secondary | ICD-10-CM | POA: Diagnosis not present

## 2022-06-27 DIAGNOSIS — F419 Anxiety disorder, unspecified: Secondary | ICD-10-CM | POA: Diagnosis not present

## 2022-06-27 DIAGNOSIS — U071 COVID-19: Secondary | ICD-10-CM | POA: Diagnosis not present

## 2022-06-30 DIAGNOSIS — J811 Chronic pulmonary edema: Secondary | ICD-10-CM | POA: Diagnosis not present

## 2022-07-07 DIAGNOSIS — Z7982 Long term (current) use of aspirin: Secondary | ICD-10-CM | POA: Diagnosis not present

## 2022-07-07 DIAGNOSIS — F03B11 Unspecified dementia, moderate, with agitation: Secondary | ICD-10-CM | POA: Diagnosis not present

## 2022-07-07 DIAGNOSIS — I129 Hypertensive chronic kidney disease with stage 1 through stage 4 chronic kidney disease, or unspecified chronic kidney disease: Secondary | ICD-10-CM | POA: Diagnosis not present

## 2022-07-07 DIAGNOSIS — Z8673 Personal history of transient ischemic attack (TIA), and cerebral infarction without residual deficits: Secondary | ICD-10-CM | POA: Diagnosis not present

## 2022-07-07 DIAGNOSIS — K5900 Constipation, unspecified: Secondary | ICD-10-CM | POA: Diagnosis not present

## 2022-07-07 DIAGNOSIS — N400 Enlarged prostate without lower urinary tract symptoms: Secondary | ICD-10-CM | POA: Diagnosis not present

## 2022-07-07 DIAGNOSIS — N189 Chronic kidney disease, unspecified: Secondary | ICD-10-CM | POA: Diagnosis not present

## 2022-07-07 DIAGNOSIS — K219 Gastro-esophageal reflux disease without esophagitis: Secondary | ICD-10-CM | POA: Diagnosis not present

## 2022-07-08 DIAGNOSIS — I10 Essential (primary) hypertension: Secondary | ICD-10-CM | POA: Diagnosis not present

## 2022-07-09 DIAGNOSIS — E1122 Type 2 diabetes mellitus with diabetic chronic kidney disease: Secondary | ICD-10-CM | POA: Diagnosis not present

## 2022-07-09 DIAGNOSIS — R296 Repeated falls: Secondary | ICD-10-CM | POA: Diagnosis not present

## 2022-07-09 DIAGNOSIS — S0181XD Laceration without foreign body of other part of head, subsequent encounter: Secondary | ICD-10-CM | POA: Diagnosis not present

## 2022-07-09 DIAGNOSIS — I63532 Cerebral infarction due to unspecified occlusion or stenosis of left posterior cerebral artery: Secondary | ICD-10-CM | POA: Diagnosis not present

## 2022-07-09 DIAGNOSIS — H538 Other visual disturbances: Secondary | ICD-10-CM | POA: Diagnosis not present

## 2022-07-09 DIAGNOSIS — M160 Bilateral primary osteoarthritis of hip: Secondary | ICD-10-CM | POA: Diagnosis not present

## 2022-07-09 DIAGNOSIS — I236 Thrombosis of atrium, auricular appendage, and ventricle as current complications following acute myocardial infarction: Secondary | ICD-10-CM | POA: Diagnosis not present

## 2022-07-09 DIAGNOSIS — F0394 Unspecified dementia, unspecified severity, with anxiety: Secondary | ICD-10-CM | POA: Diagnosis not present

## 2022-07-09 DIAGNOSIS — I255 Ischemic cardiomyopathy: Secondary | ICD-10-CM | POA: Diagnosis not present

## 2022-07-09 DIAGNOSIS — J449 Chronic obstructive pulmonary disease, unspecified: Secondary | ICD-10-CM | POA: Diagnosis not present

## 2022-07-10 DIAGNOSIS — R059 Cough, unspecified: Secondary | ICD-10-CM | POA: Diagnosis not present

## 2022-07-11 DIAGNOSIS — E1122 Type 2 diabetes mellitus with diabetic chronic kidney disease: Secondary | ICD-10-CM | POA: Diagnosis not present

## 2022-07-11 DIAGNOSIS — R296 Repeated falls: Secondary | ICD-10-CM | POA: Diagnosis not present

## 2022-07-11 DIAGNOSIS — J449 Chronic obstructive pulmonary disease, unspecified: Secondary | ICD-10-CM | POA: Diagnosis not present

## 2022-07-11 DIAGNOSIS — I63532 Cerebral infarction due to unspecified occlusion or stenosis of left posterior cerebral artery: Secondary | ICD-10-CM | POA: Diagnosis not present

## 2022-07-11 DIAGNOSIS — H538 Other visual disturbances: Secondary | ICD-10-CM | POA: Diagnosis not present

## 2022-07-11 DIAGNOSIS — S0181XD Laceration without foreign body of other part of head, subsequent encounter: Secondary | ICD-10-CM | POA: Diagnosis not present

## 2022-07-12 DIAGNOSIS — N390 Urinary tract infection, site not specified: Secondary | ICD-10-CM | POA: Diagnosis not present

## 2022-07-12 DIAGNOSIS — N139 Obstructive and reflux uropathy, unspecified: Secondary | ICD-10-CM | POA: Diagnosis not present

## 2022-07-14 DIAGNOSIS — R296 Repeated falls: Secondary | ICD-10-CM | POA: Diagnosis not present

## 2022-07-14 DIAGNOSIS — S0181XD Laceration without foreign body of other part of head, subsequent encounter: Secondary | ICD-10-CM | POA: Diagnosis not present

## 2022-07-14 DIAGNOSIS — H538 Other visual disturbances: Secondary | ICD-10-CM | POA: Diagnosis not present

## 2022-07-14 DIAGNOSIS — I63532 Cerebral infarction due to unspecified occlusion or stenosis of left posterior cerebral artery: Secondary | ICD-10-CM | POA: Diagnosis not present

## 2022-07-14 DIAGNOSIS — E1122 Type 2 diabetes mellitus with diabetic chronic kidney disease: Secondary | ICD-10-CM | POA: Diagnosis not present

## 2022-07-14 DIAGNOSIS — J449 Chronic obstructive pulmonary disease, unspecified: Secondary | ICD-10-CM | POA: Diagnosis not present

## 2022-07-15 DIAGNOSIS — J449 Chronic obstructive pulmonary disease, unspecified: Secondary | ICD-10-CM | POA: Diagnosis not present

## 2022-07-15 DIAGNOSIS — I63532 Cerebral infarction due to unspecified occlusion or stenosis of left posterior cerebral artery: Secondary | ICD-10-CM | POA: Diagnosis not present

## 2022-07-15 DIAGNOSIS — R296 Repeated falls: Secondary | ICD-10-CM | POA: Diagnosis not present

## 2022-07-15 DIAGNOSIS — S0181XD Laceration without foreign body of other part of head, subsequent encounter: Secondary | ICD-10-CM | POA: Diagnosis not present

## 2022-07-15 DIAGNOSIS — H538 Other visual disturbances: Secondary | ICD-10-CM | POA: Diagnosis not present

## 2022-07-15 DIAGNOSIS — E1122 Type 2 diabetes mellitus with diabetic chronic kidney disease: Secondary | ICD-10-CM | POA: Diagnosis not present

## 2022-07-16 DIAGNOSIS — R296 Repeated falls: Secondary | ICD-10-CM | POA: Diagnosis not present

## 2022-07-16 DIAGNOSIS — N401 Enlarged prostate with lower urinary tract symptoms: Secondary | ICD-10-CM | POA: Diagnosis not present

## 2022-07-16 DIAGNOSIS — F03B11 Unspecified dementia, moderate, with agitation: Secondary | ICD-10-CM | POA: Diagnosis not present

## 2022-07-16 DIAGNOSIS — I129 Hypertensive chronic kidney disease with stage 1 through stage 4 chronic kidney disease, or unspecified chronic kidney disease: Secondary | ICD-10-CM | POA: Diagnosis not present

## 2022-07-16 DIAGNOSIS — S0181XD Laceration without foreign body of other part of head, subsequent encounter: Secondary | ICD-10-CM | POA: Diagnosis not present

## 2022-07-16 DIAGNOSIS — J449 Chronic obstructive pulmonary disease, unspecified: Secondary | ICD-10-CM | POA: Diagnosis not present

## 2022-07-16 DIAGNOSIS — R0902 Hypoxemia: Secondary | ICD-10-CM | POA: Diagnosis not present

## 2022-07-16 DIAGNOSIS — N184 Chronic kidney disease, stage 4 (severe): Secondary | ICD-10-CM | POA: Diagnosis not present

## 2022-07-16 DIAGNOSIS — I63532 Cerebral infarction due to unspecified occlusion or stenosis of left posterior cerebral artery: Secondary | ICD-10-CM | POA: Diagnosis not present

## 2022-07-16 DIAGNOSIS — E1122 Type 2 diabetes mellitus with diabetic chronic kidney disease: Secondary | ICD-10-CM | POA: Diagnosis not present

## 2022-07-16 DIAGNOSIS — H538 Other visual disturbances: Secondary | ICD-10-CM | POA: Diagnosis not present

## 2022-07-17 DIAGNOSIS — S0181XD Laceration without foreign body of other part of head, subsequent encounter: Secondary | ICD-10-CM | POA: Diagnosis not present

## 2022-07-17 DIAGNOSIS — J449 Chronic obstructive pulmonary disease, unspecified: Secondary | ICD-10-CM | POA: Diagnosis not present

## 2022-07-17 DIAGNOSIS — H538 Other visual disturbances: Secondary | ICD-10-CM | POA: Diagnosis not present

## 2022-07-17 DIAGNOSIS — R296 Repeated falls: Secondary | ICD-10-CM | POA: Diagnosis not present

## 2022-07-17 DIAGNOSIS — I63532 Cerebral infarction due to unspecified occlusion or stenosis of left posterior cerebral artery: Secondary | ICD-10-CM | POA: Diagnosis not present

## 2022-07-17 DIAGNOSIS — E1122 Type 2 diabetes mellitus with diabetic chronic kidney disease: Secondary | ICD-10-CM | POA: Diagnosis not present

## 2022-07-18 DIAGNOSIS — R296 Repeated falls: Secondary | ICD-10-CM | POA: Diagnosis not present

## 2022-07-18 DIAGNOSIS — I63532 Cerebral infarction due to unspecified occlusion or stenosis of left posterior cerebral artery: Secondary | ICD-10-CM | POA: Diagnosis not present

## 2022-07-18 DIAGNOSIS — E1122 Type 2 diabetes mellitus with diabetic chronic kidney disease: Secondary | ICD-10-CM | POA: Diagnosis not present

## 2022-07-18 DIAGNOSIS — S0181XD Laceration without foreign body of other part of head, subsequent encounter: Secondary | ICD-10-CM | POA: Diagnosis not present

## 2022-07-18 DIAGNOSIS — J449 Chronic obstructive pulmonary disease, unspecified: Secondary | ICD-10-CM | POA: Diagnosis not present

## 2022-07-18 DIAGNOSIS — H538 Other visual disturbances: Secondary | ICD-10-CM | POA: Diagnosis not present

## 2022-07-21 DIAGNOSIS — J449 Chronic obstructive pulmonary disease, unspecified: Secondary | ICD-10-CM | POA: Diagnosis not present

## 2022-07-21 DIAGNOSIS — R296 Repeated falls: Secondary | ICD-10-CM | POA: Diagnosis not present

## 2022-07-21 DIAGNOSIS — S0181XD Laceration without foreign body of other part of head, subsequent encounter: Secondary | ICD-10-CM | POA: Diagnosis not present

## 2022-07-21 DIAGNOSIS — E1122 Type 2 diabetes mellitus with diabetic chronic kidney disease: Secondary | ICD-10-CM | POA: Diagnosis not present

## 2022-07-21 DIAGNOSIS — I63532 Cerebral infarction due to unspecified occlusion or stenosis of left posterior cerebral artery: Secondary | ICD-10-CM | POA: Diagnosis not present

## 2022-07-21 DIAGNOSIS — H538 Other visual disturbances: Secondary | ICD-10-CM | POA: Diagnosis not present

## 2022-07-22 DIAGNOSIS — J9691 Respiratory failure, unspecified with hypoxia: Secondary | ICD-10-CM | POA: Diagnosis not present

## 2022-07-22 DIAGNOSIS — J449 Chronic obstructive pulmonary disease, unspecified: Secondary | ICD-10-CM | POA: Diagnosis not present

## 2022-07-22 DIAGNOSIS — R296 Repeated falls: Secondary | ICD-10-CM | POA: Diagnosis not present

## 2022-07-22 DIAGNOSIS — I63532 Cerebral infarction due to unspecified occlusion or stenosis of left posterior cerebral artery: Secondary | ICD-10-CM | POA: Diagnosis not present

## 2022-07-22 DIAGNOSIS — S0181XD Laceration without foreign body of other part of head, subsequent encounter: Secondary | ICD-10-CM | POA: Diagnosis not present

## 2022-07-22 DIAGNOSIS — H538 Other visual disturbances: Secondary | ICD-10-CM | POA: Diagnosis not present

## 2022-07-22 DIAGNOSIS — D72829 Elevated white blood cell count, unspecified: Secondary | ICD-10-CM | POA: Diagnosis not present

## 2022-07-22 DIAGNOSIS — E1122 Type 2 diabetes mellitus with diabetic chronic kidney disease: Secondary | ICD-10-CM | POA: Diagnosis not present

## 2022-07-23 DIAGNOSIS — H538 Other visual disturbances: Secondary | ICD-10-CM | POA: Diagnosis not present

## 2022-07-23 DIAGNOSIS — S0181XD Laceration without foreign body of other part of head, subsequent encounter: Secondary | ICD-10-CM | POA: Diagnosis not present

## 2022-07-23 DIAGNOSIS — I63532 Cerebral infarction due to unspecified occlusion or stenosis of left posterior cerebral artery: Secondary | ICD-10-CM | POA: Diagnosis not present

## 2022-07-23 DIAGNOSIS — J449 Chronic obstructive pulmonary disease, unspecified: Secondary | ICD-10-CM | POA: Diagnosis not present

## 2022-07-23 DIAGNOSIS — R296 Repeated falls: Secondary | ICD-10-CM | POA: Diagnosis not present

## 2022-07-23 DIAGNOSIS — E1122 Type 2 diabetes mellitus with diabetic chronic kidney disease: Secondary | ICD-10-CM | POA: Diagnosis not present

## 2022-07-24 DIAGNOSIS — N390 Urinary tract infection, site not specified: Secondary | ICD-10-CM | POA: Diagnosis not present

## 2022-07-24 DIAGNOSIS — S0181XD Laceration without foreign body of other part of head, subsequent encounter: Secondary | ICD-10-CM | POA: Diagnosis not present

## 2022-07-24 DIAGNOSIS — R296 Repeated falls: Secondary | ICD-10-CM | POA: Diagnosis not present

## 2022-07-24 DIAGNOSIS — J449 Chronic obstructive pulmonary disease, unspecified: Secondary | ICD-10-CM | POA: Diagnosis not present

## 2022-07-24 DIAGNOSIS — E1122 Type 2 diabetes mellitus with diabetic chronic kidney disease: Secondary | ICD-10-CM | POA: Diagnosis not present

## 2022-07-24 DIAGNOSIS — D72829 Elevated white blood cell count, unspecified: Secondary | ICD-10-CM | POA: Diagnosis not present

## 2022-07-24 DIAGNOSIS — I63532 Cerebral infarction due to unspecified occlusion or stenosis of left posterior cerebral artery: Secondary | ICD-10-CM | POA: Diagnosis not present

## 2022-07-24 DIAGNOSIS — H538 Other visual disturbances: Secondary | ICD-10-CM | POA: Diagnosis not present

## 2022-07-25 DIAGNOSIS — E1122 Type 2 diabetes mellitus with diabetic chronic kidney disease: Secondary | ICD-10-CM | POA: Diagnosis not present

## 2022-07-25 DIAGNOSIS — S0181XD Laceration without foreign body of other part of head, subsequent encounter: Secondary | ICD-10-CM | POA: Diagnosis not present

## 2022-07-25 DIAGNOSIS — I63532 Cerebral infarction due to unspecified occlusion or stenosis of left posterior cerebral artery: Secondary | ICD-10-CM | POA: Diagnosis not present

## 2022-07-25 DIAGNOSIS — R296 Repeated falls: Secondary | ICD-10-CM | POA: Diagnosis not present

## 2022-07-25 DIAGNOSIS — J449 Chronic obstructive pulmonary disease, unspecified: Secondary | ICD-10-CM | POA: Diagnosis not present

## 2022-07-25 DIAGNOSIS — H538 Other visual disturbances: Secondary | ICD-10-CM | POA: Diagnosis not present

## 2022-07-28 DIAGNOSIS — I129 Hypertensive chronic kidney disease with stage 1 through stage 4 chronic kidney disease, or unspecified chronic kidney disease: Secondary | ICD-10-CM | POA: Diagnosis not present

## 2022-07-28 DIAGNOSIS — N390 Urinary tract infection, site not specified: Secondary | ICD-10-CM | POA: Diagnosis not present

## 2022-07-28 DIAGNOSIS — N189 Chronic kidney disease, unspecified: Secondary | ICD-10-CM | POA: Diagnosis not present

## 2022-07-28 DIAGNOSIS — B9620 Unspecified Escherichia coli [E. coli] as the cause of diseases classified elsewhere: Secondary | ICD-10-CM | POA: Diagnosis not present

## 2022-07-28 DIAGNOSIS — F03B18 Unspecified dementia, moderate, with other behavioral disturbance: Secondary | ICD-10-CM | POA: Diagnosis not present

## 2022-07-28 DIAGNOSIS — J449 Chronic obstructive pulmonary disease, unspecified: Secondary | ICD-10-CM | POA: Diagnosis not present

## 2022-08-05 DIAGNOSIS — I255 Ischemic cardiomyopathy: Secondary | ICD-10-CM | POA: Diagnosis not present

## 2022-08-05 DIAGNOSIS — Z9181 History of falling: Secondary | ICD-10-CM | POA: Diagnosis not present

## 2022-08-05 DIAGNOSIS — E441 Mild protein-calorie malnutrition: Secondary | ICD-10-CM | POA: Diagnosis not present

## 2022-08-05 DIAGNOSIS — M1990 Unspecified osteoarthritis, unspecified site: Secondary | ICD-10-CM | POA: Diagnosis not present

## 2022-08-05 DIAGNOSIS — I130 Hypertensive heart and chronic kidney disease with heart failure and stage 1 through stage 4 chronic kidney disease, or unspecified chronic kidney disease: Secondary | ICD-10-CM | POA: Diagnosis not present

## 2022-08-05 DIAGNOSIS — Z87440 Personal history of urinary (tract) infections: Secondary | ICD-10-CM | POA: Diagnosis not present

## 2022-08-05 DIAGNOSIS — I679 Cerebrovascular disease, unspecified: Secondary | ICD-10-CM | POA: Diagnosis not present

## 2022-08-05 DIAGNOSIS — I5022 Chronic systolic (congestive) heart failure: Secondary | ICD-10-CM | POA: Diagnosis not present

## 2022-08-05 DIAGNOSIS — F01511 Vascular dementia, unspecified severity, with agitation: Secondary | ICD-10-CM | POA: Diagnosis not present

## 2022-08-05 DIAGNOSIS — H547 Unspecified visual loss: Secondary | ICD-10-CM | POA: Diagnosis not present

## 2022-08-05 DIAGNOSIS — K219 Gastro-esophageal reflux disease without esophagitis: Secondary | ICD-10-CM | POA: Diagnosis not present

## 2022-08-05 DIAGNOSIS — I69398 Other sequelae of cerebral infarction: Secondary | ICD-10-CM | POA: Diagnosis not present

## 2022-08-05 DIAGNOSIS — E1122 Type 2 diabetes mellitus with diabetic chronic kidney disease: Secondary | ICD-10-CM | POA: Diagnosis not present

## 2022-08-05 DIAGNOSIS — N184 Chronic kidney disease, stage 4 (severe): Secondary | ICD-10-CM | POA: Diagnosis not present

## 2022-08-05 DIAGNOSIS — I513 Intracardiac thrombosis, not elsewhere classified: Secondary | ICD-10-CM | POA: Diagnosis not present

## 2022-08-06 DIAGNOSIS — F01511 Vascular dementia, unspecified severity, with agitation: Secondary | ICD-10-CM | POA: Diagnosis not present

## 2022-08-06 DIAGNOSIS — I130 Hypertensive heart and chronic kidney disease with heart failure and stage 1 through stage 4 chronic kidney disease, or unspecified chronic kidney disease: Secondary | ICD-10-CM | POA: Diagnosis not present

## 2022-08-06 DIAGNOSIS — I69398 Other sequelae of cerebral infarction: Secondary | ICD-10-CM | POA: Diagnosis not present

## 2022-08-06 DIAGNOSIS — I679 Cerebrovascular disease, unspecified: Secondary | ICD-10-CM | POA: Diagnosis not present

## 2022-08-06 DIAGNOSIS — I5022 Chronic systolic (congestive) heart failure: Secondary | ICD-10-CM | POA: Diagnosis not present

## 2022-08-06 DIAGNOSIS — H547 Unspecified visual loss: Secondary | ICD-10-CM | POA: Diagnosis not present

## 2022-08-07 DIAGNOSIS — H547 Unspecified visual loss: Secondary | ICD-10-CM | POA: Diagnosis not present

## 2022-08-07 DIAGNOSIS — I5022 Chronic systolic (congestive) heart failure: Secondary | ICD-10-CM | POA: Diagnosis not present

## 2022-08-07 DIAGNOSIS — I69398 Other sequelae of cerebral infarction: Secondary | ICD-10-CM | POA: Diagnosis not present

## 2022-08-07 DIAGNOSIS — I130 Hypertensive heart and chronic kidney disease with heart failure and stage 1 through stage 4 chronic kidney disease, or unspecified chronic kidney disease: Secondary | ICD-10-CM | POA: Diagnosis not present

## 2022-08-07 DIAGNOSIS — I679 Cerebrovascular disease, unspecified: Secondary | ICD-10-CM | POA: Diagnosis not present

## 2022-08-07 DIAGNOSIS — F01511 Vascular dementia, unspecified severity, with agitation: Secondary | ICD-10-CM | POA: Diagnosis not present

## 2022-08-08 DIAGNOSIS — I5022 Chronic systolic (congestive) heart failure: Secondary | ICD-10-CM | POA: Diagnosis not present

## 2022-08-08 DIAGNOSIS — I130 Hypertensive heart and chronic kidney disease with heart failure and stage 1 through stage 4 chronic kidney disease, or unspecified chronic kidney disease: Secondary | ICD-10-CM | POA: Diagnosis not present

## 2022-08-08 DIAGNOSIS — I679 Cerebrovascular disease, unspecified: Secondary | ICD-10-CM | POA: Diagnosis not present

## 2022-08-08 DIAGNOSIS — F01511 Vascular dementia, unspecified severity, with agitation: Secondary | ICD-10-CM | POA: Diagnosis not present

## 2022-08-08 DIAGNOSIS — I69398 Other sequelae of cerebral infarction: Secondary | ICD-10-CM | POA: Diagnosis not present

## 2022-08-08 DIAGNOSIS — H547 Unspecified visual loss: Secondary | ICD-10-CM | POA: Diagnosis not present

## 2022-08-13 DIAGNOSIS — I679 Cerebrovascular disease, unspecified: Secondary | ICD-10-CM | POA: Diagnosis not present

## 2022-08-13 DIAGNOSIS — I5022 Chronic systolic (congestive) heart failure: Secondary | ICD-10-CM | POA: Diagnosis not present

## 2022-08-13 DIAGNOSIS — H547 Unspecified visual loss: Secondary | ICD-10-CM | POA: Diagnosis not present

## 2022-08-13 DIAGNOSIS — I130 Hypertensive heart and chronic kidney disease with heart failure and stage 1 through stage 4 chronic kidney disease, or unspecified chronic kidney disease: Secondary | ICD-10-CM | POA: Diagnosis not present

## 2022-08-13 DIAGNOSIS — F01511 Vascular dementia, unspecified severity, with agitation: Secondary | ICD-10-CM | POA: Diagnosis not present

## 2022-08-13 DIAGNOSIS — I69398 Other sequelae of cerebral infarction: Secondary | ICD-10-CM | POA: Diagnosis not present

## 2022-08-14 DIAGNOSIS — I679 Cerebrovascular disease, unspecified: Secondary | ICD-10-CM | POA: Diagnosis not present

## 2022-08-14 DIAGNOSIS — F01511 Vascular dementia, unspecified severity, with agitation: Secondary | ICD-10-CM | POA: Diagnosis not present

## 2022-08-14 DIAGNOSIS — I5022 Chronic systolic (congestive) heart failure: Secondary | ICD-10-CM | POA: Diagnosis not present

## 2022-08-14 DIAGNOSIS — H547 Unspecified visual loss: Secondary | ICD-10-CM | POA: Diagnosis not present

## 2022-08-14 DIAGNOSIS — I130 Hypertensive heart and chronic kidney disease with heart failure and stage 1 through stage 4 chronic kidney disease, or unspecified chronic kidney disease: Secondary | ICD-10-CM | POA: Diagnosis not present

## 2022-08-14 DIAGNOSIS — I69398 Other sequelae of cerebral infarction: Secondary | ICD-10-CM | POA: Diagnosis not present

## 2022-08-18 ENCOUNTER — Telehealth: Payer: Self-pay

## 2022-08-18 NOTE — Telephone Encounter (Signed)
LVM for pt to call back in regards to scheduling AWV with our health coach.   08/18/2022@currenttime$ @

## 2022-08-20 DIAGNOSIS — I130 Hypertensive heart and chronic kidney disease with heart failure and stage 1 through stage 4 chronic kidney disease, or unspecified chronic kidney disease: Secondary | ICD-10-CM | POA: Diagnosis not present

## 2022-08-20 DIAGNOSIS — I5022 Chronic systolic (congestive) heart failure: Secondary | ICD-10-CM | POA: Diagnosis not present

## 2022-08-20 DIAGNOSIS — I679 Cerebrovascular disease, unspecified: Secondary | ICD-10-CM | POA: Diagnosis not present

## 2022-08-20 DIAGNOSIS — I69398 Other sequelae of cerebral infarction: Secondary | ICD-10-CM | POA: Diagnosis not present

## 2022-08-20 DIAGNOSIS — H547 Unspecified visual loss: Secondary | ICD-10-CM | POA: Diagnosis not present

## 2022-08-20 DIAGNOSIS — F01511 Vascular dementia, unspecified severity, with agitation: Secondary | ICD-10-CM | POA: Diagnosis not present

## 2022-08-22 DIAGNOSIS — I69398 Other sequelae of cerebral infarction: Secondary | ICD-10-CM | POA: Diagnosis not present

## 2022-08-22 DIAGNOSIS — I130 Hypertensive heart and chronic kidney disease with heart failure and stage 1 through stage 4 chronic kidney disease, or unspecified chronic kidney disease: Secondary | ICD-10-CM | POA: Diagnosis not present

## 2022-08-22 DIAGNOSIS — F01511 Vascular dementia, unspecified severity, with agitation: Secondary | ICD-10-CM | POA: Diagnosis not present

## 2022-08-22 DIAGNOSIS — I679 Cerebrovascular disease, unspecified: Secondary | ICD-10-CM | POA: Diagnosis not present

## 2022-08-22 DIAGNOSIS — I5022 Chronic systolic (congestive) heart failure: Secondary | ICD-10-CM | POA: Diagnosis not present

## 2022-08-22 DIAGNOSIS — H547 Unspecified visual loss: Secondary | ICD-10-CM | POA: Diagnosis not present

## 2022-08-25 DIAGNOSIS — I69398 Other sequelae of cerebral infarction: Secondary | ICD-10-CM | POA: Diagnosis not present

## 2022-08-25 DIAGNOSIS — I130 Hypertensive heart and chronic kidney disease with heart failure and stage 1 through stage 4 chronic kidney disease, or unspecified chronic kidney disease: Secondary | ICD-10-CM | POA: Diagnosis not present

## 2022-08-25 DIAGNOSIS — I679 Cerebrovascular disease, unspecified: Secondary | ICD-10-CM | POA: Diagnosis not present

## 2022-08-25 DIAGNOSIS — H547 Unspecified visual loss: Secondary | ICD-10-CM | POA: Diagnosis not present

## 2022-08-25 DIAGNOSIS — F01511 Vascular dementia, unspecified severity, with agitation: Secondary | ICD-10-CM | POA: Diagnosis not present

## 2022-08-25 DIAGNOSIS — I5022 Chronic systolic (congestive) heart failure: Secondary | ICD-10-CM | POA: Diagnosis not present

## 2022-08-27 DIAGNOSIS — F01511 Vascular dementia, unspecified severity, with agitation: Secondary | ICD-10-CM | POA: Diagnosis not present

## 2022-08-27 DIAGNOSIS — I5022 Chronic systolic (congestive) heart failure: Secondary | ICD-10-CM | POA: Diagnosis not present

## 2022-08-27 DIAGNOSIS — I69398 Other sequelae of cerebral infarction: Secondary | ICD-10-CM | POA: Diagnosis not present

## 2022-08-27 DIAGNOSIS — I679 Cerebrovascular disease, unspecified: Secondary | ICD-10-CM | POA: Diagnosis not present

## 2022-08-27 DIAGNOSIS — H547 Unspecified visual loss: Secondary | ICD-10-CM | POA: Diagnosis not present

## 2022-08-27 DIAGNOSIS — I130 Hypertensive heart and chronic kidney disease with heart failure and stage 1 through stage 4 chronic kidney disease, or unspecified chronic kidney disease: Secondary | ICD-10-CM | POA: Diagnosis not present

## 2022-08-28 DIAGNOSIS — I679 Cerebrovascular disease, unspecified: Secondary | ICD-10-CM | POA: Diagnosis not present

## 2022-08-28 DIAGNOSIS — I69398 Other sequelae of cerebral infarction: Secondary | ICD-10-CM | POA: Diagnosis not present

## 2022-08-28 DIAGNOSIS — F01511 Vascular dementia, unspecified severity, with agitation: Secondary | ICD-10-CM | POA: Diagnosis not present

## 2022-08-28 DIAGNOSIS — H547 Unspecified visual loss: Secondary | ICD-10-CM | POA: Diagnosis not present

## 2022-08-28 DIAGNOSIS — I5022 Chronic systolic (congestive) heart failure: Secondary | ICD-10-CM | POA: Diagnosis not present

## 2022-08-28 DIAGNOSIS — I130 Hypertensive heart and chronic kidney disease with heart failure and stage 1 through stage 4 chronic kidney disease, or unspecified chronic kidney disease: Secondary | ICD-10-CM | POA: Diagnosis not present

## 2022-08-29 DIAGNOSIS — I513 Intracardiac thrombosis, not elsewhere classified: Secondary | ICD-10-CM | POA: Diagnosis not present

## 2022-08-29 DIAGNOSIS — I69398 Other sequelae of cerebral infarction: Secondary | ICD-10-CM | POA: Diagnosis not present

## 2022-08-29 DIAGNOSIS — N184 Chronic kidney disease, stage 4 (severe): Secondary | ICD-10-CM | POA: Diagnosis not present

## 2022-08-29 DIAGNOSIS — M1990 Unspecified osteoarthritis, unspecified site: Secondary | ICD-10-CM | POA: Diagnosis not present

## 2022-08-29 DIAGNOSIS — I5022 Chronic systolic (congestive) heart failure: Secondary | ICD-10-CM | POA: Diagnosis not present

## 2022-08-29 DIAGNOSIS — E441 Mild protein-calorie malnutrition: Secondary | ICD-10-CM | POA: Diagnosis not present

## 2022-08-29 DIAGNOSIS — I679 Cerebrovascular disease, unspecified: Secondary | ICD-10-CM | POA: Diagnosis not present

## 2022-08-29 DIAGNOSIS — K219 Gastro-esophageal reflux disease without esophagitis: Secondary | ICD-10-CM | POA: Diagnosis not present

## 2022-08-29 DIAGNOSIS — E1122 Type 2 diabetes mellitus with diabetic chronic kidney disease: Secondary | ICD-10-CM | POA: Diagnosis not present

## 2022-08-29 DIAGNOSIS — I130 Hypertensive heart and chronic kidney disease with heart failure and stage 1 through stage 4 chronic kidney disease, or unspecified chronic kidney disease: Secondary | ICD-10-CM | POA: Diagnosis not present

## 2022-08-29 DIAGNOSIS — Z87440 Personal history of urinary (tract) infections: Secondary | ICD-10-CM | POA: Diagnosis not present

## 2022-08-29 DIAGNOSIS — I255 Ischemic cardiomyopathy: Secondary | ICD-10-CM | POA: Diagnosis not present

## 2022-08-29 DIAGNOSIS — H547 Unspecified visual loss: Secondary | ICD-10-CM | POA: Diagnosis not present

## 2022-08-29 DIAGNOSIS — F01511 Vascular dementia, unspecified severity, with agitation: Secondary | ICD-10-CM | POA: Diagnosis not present

## 2022-08-29 DIAGNOSIS — Z9181 History of falling: Secondary | ICD-10-CM | POA: Diagnosis not present

## 2022-08-30 DIAGNOSIS — F01511 Vascular dementia, unspecified severity, with agitation: Secondary | ICD-10-CM | POA: Diagnosis not present

## 2022-08-30 DIAGNOSIS — H547 Unspecified visual loss: Secondary | ICD-10-CM | POA: Diagnosis not present

## 2022-08-30 DIAGNOSIS — I69398 Other sequelae of cerebral infarction: Secondary | ICD-10-CM | POA: Diagnosis not present

## 2022-08-30 DIAGNOSIS — I5022 Chronic systolic (congestive) heart failure: Secondary | ICD-10-CM | POA: Diagnosis not present

## 2022-08-30 DIAGNOSIS — I679 Cerebrovascular disease, unspecified: Secondary | ICD-10-CM | POA: Diagnosis not present

## 2022-08-30 DIAGNOSIS — I130 Hypertensive heart and chronic kidney disease with heart failure and stage 1 through stage 4 chronic kidney disease, or unspecified chronic kidney disease: Secondary | ICD-10-CM | POA: Diagnosis not present

## 2022-09-01 DIAGNOSIS — F01511 Vascular dementia, unspecified severity, with agitation: Secondary | ICD-10-CM | POA: Diagnosis not present

## 2022-09-01 DIAGNOSIS — I679 Cerebrovascular disease, unspecified: Secondary | ICD-10-CM | POA: Diagnosis not present

## 2022-09-01 DIAGNOSIS — I130 Hypertensive heart and chronic kidney disease with heart failure and stage 1 through stage 4 chronic kidney disease, or unspecified chronic kidney disease: Secondary | ICD-10-CM | POA: Diagnosis not present

## 2022-09-01 DIAGNOSIS — I69398 Other sequelae of cerebral infarction: Secondary | ICD-10-CM | POA: Diagnosis not present

## 2022-09-01 DIAGNOSIS — I5022 Chronic systolic (congestive) heart failure: Secondary | ICD-10-CM | POA: Diagnosis not present

## 2022-09-01 DIAGNOSIS — H547 Unspecified visual loss: Secondary | ICD-10-CM | POA: Diagnosis not present

## 2022-09-01 DIAGNOSIS — Z7902 Long term (current) use of antithrombotics/antiplatelets: Secondary | ICD-10-CM | POA: Diagnosis not present

## 2022-09-02 DIAGNOSIS — I5022 Chronic systolic (congestive) heart failure: Secondary | ICD-10-CM | POA: Diagnosis not present

## 2022-09-02 DIAGNOSIS — I130 Hypertensive heart and chronic kidney disease with heart failure and stage 1 through stage 4 chronic kidney disease, or unspecified chronic kidney disease: Secondary | ICD-10-CM | POA: Diagnosis not present

## 2022-09-02 DIAGNOSIS — H547 Unspecified visual loss: Secondary | ICD-10-CM | POA: Diagnosis not present

## 2022-09-02 DIAGNOSIS — I679 Cerebrovascular disease, unspecified: Secondary | ICD-10-CM | POA: Diagnosis not present

## 2022-09-02 DIAGNOSIS — F01511 Vascular dementia, unspecified severity, with agitation: Secondary | ICD-10-CM | POA: Diagnosis not present

## 2022-09-02 DIAGNOSIS — E119 Type 2 diabetes mellitus without complications: Secondary | ICD-10-CM | POA: Diagnosis not present

## 2022-09-02 DIAGNOSIS — I69398 Other sequelae of cerebral infarction: Secondary | ICD-10-CM | POA: Diagnosis not present

## 2022-09-03 DIAGNOSIS — I5022 Chronic systolic (congestive) heart failure: Secondary | ICD-10-CM | POA: Diagnosis not present

## 2022-09-03 DIAGNOSIS — F01511 Vascular dementia, unspecified severity, with agitation: Secondary | ICD-10-CM | POA: Diagnosis not present

## 2022-09-03 DIAGNOSIS — H547 Unspecified visual loss: Secondary | ICD-10-CM | POA: Diagnosis not present

## 2022-09-03 DIAGNOSIS — I679 Cerebrovascular disease, unspecified: Secondary | ICD-10-CM | POA: Diagnosis not present

## 2022-09-03 DIAGNOSIS — I130 Hypertensive heart and chronic kidney disease with heart failure and stage 1 through stage 4 chronic kidney disease, or unspecified chronic kidney disease: Secondary | ICD-10-CM | POA: Diagnosis not present

## 2022-09-03 DIAGNOSIS — I69398 Other sequelae of cerebral infarction: Secondary | ICD-10-CM | POA: Diagnosis not present

## 2022-09-04 DIAGNOSIS — I5022 Chronic systolic (congestive) heart failure: Secondary | ICD-10-CM | POA: Diagnosis not present

## 2022-09-04 DIAGNOSIS — I69398 Other sequelae of cerebral infarction: Secondary | ICD-10-CM | POA: Diagnosis not present

## 2022-09-04 DIAGNOSIS — H547 Unspecified visual loss: Secondary | ICD-10-CM | POA: Diagnosis not present

## 2022-09-04 DIAGNOSIS — I130 Hypertensive heart and chronic kidney disease with heart failure and stage 1 through stage 4 chronic kidney disease, or unspecified chronic kidney disease: Secondary | ICD-10-CM | POA: Diagnosis not present

## 2022-09-04 DIAGNOSIS — F01511 Vascular dementia, unspecified severity, with agitation: Secondary | ICD-10-CM | POA: Diagnosis not present

## 2022-09-04 DIAGNOSIS — I679 Cerebrovascular disease, unspecified: Secondary | ICD-10-CM | POA: Diagnosis not present

## 2022-09-05 DIAGNOSIS — I5022 Chronic systolic (congestive) heart failure: Secondary | ICD-10-CM | POA: Diagnosis not present

## 2022-09-05 DIAGNOSIS — H547 Unspecified visual loss: Secondary | ICD-10-CM | POA: Diagnosis not present

## 2022-09-05 DIAGNOSIS — F01511 Vascular dementia, unspecified severity, with agitation: Secondary | ICD-10-CM | POA: Diagnosis not present

## 2022-09-05 DIAGNOSIS — I130 Hypertensive heart and chronic kidney disease with heart failure and stage 1 through stage 4 chronic kidney disease, or unspecified chronic kidney disease: Secondary | ICD-10-CM | POA: Diagnosis not present

## 2022-09-05 DIAGNOSIS — I679 Cerebrovascular disease, unspecified: Secondary | ICD-10-CM | POA: Diagnosis not present

## 2022-09-05 DIAGNOSIS — I69398 Other sequelae of cerebral infarction: Secondary | ICD-10-CM | POA: Diagnosis not present

## 2022-09-06 DIAGNOSIS — F01511 Vascular dementia, unspecified severity, with agitation: Secondary | ICD-10-CM | POA: Diagnosis not present

## 2022-09-06 DIAGNOSIS — I5022 Chronic systolic (congestive) heart failure: Secondary | ICD-10-CM | POA: Diagnosis not present

## 2022-09-06 DIAGNOSIS — I69398 Other sequelae of cerebral infarction: Secondary | ICD-10-CM | POA: Diagnosis not present

## 2022-09-06 DIAGNOSIS — H547 Unspecified visual loss: Secondary | ICD-10-CM | POA: Diagnosis not present

## 2022-09-06 DIAGNOSIS — I679 Cerebrovascular disease, unspecified: Secondary | ICD-10-CM | POA: Diagnosis not present

## 2022-09-06 DIAGNOSIS — I130 Hypertensive heart and chronic kidney disease with heart failure and stage 1 through stage 4 chronic kidney disease, or unspecified chronic kidney disease: Secondary | ICD-10-CM | POA: Diagnosis not present

## 2022-09-07 DIAGNOSIS — I679 Cerebrovascular disease, unspecified: Secondary | ICD-10-CM | POA: Diagnosis not present

## 2022-09-07 DIAGNOSIS — I130 Hypertensive heart and chronic kidney disease with heart failure and stage 1 through stage 4 chronic kidney disease, or unspecified chronic kidney disease: Secondary | ICD-10-CM | POA: Diagnosis not present

## 2022-09-07 DIAGNOSIS — F01511 Vascular dementia, unspecified severity, with agitation: Secondary | ICD-10-CM | POA: Diagnosis not present

## 2022-09-07 DIAGNOSIS — I5022 Chronic systolic (congestive) heart failure: Secondary | ICD-10-CM | POA: Diagnosis not present

## 2022-09-07 DIAGNOSIS — H547 Unspecified visual loss: Secondary | ICD-10-CM | POA: Diagnosis not present

## 2022-09-07 DIAGNOSIS — I69398 Other sequelae of cerebral infarction: Secondary | ICD-10-CM | POA: Diagnosis not present

## 2022-09-08 DIAGNOSIS — I5022 Chronic systolic (congestive) heart failure: Secondary | ICD-10-CM | POA: Diagnosis not present

## 2022-09-08 DIAGNOSIS — I130 Hypertensive heart and chronic kidney disease with heart failure and stage 1 through stage 4 chronic kidney disease, or unspecified chronic kidney disease: Secondary | ICD-10-CM | POA: Diagnosis not present

## 2022-09-08 DIAGNOSIS — I679 Cerebrovascular disease, unspecified: Secondary | ICD-10-CM | POA: Diagnosis not present

## 2022-09-08 DIAGNOSIS — H547 Unspecified visual loss: Secondary | ICD-10-CM | POA: Diagnosis not present

## 2022-09-08 DIAGNOSIS — I69398 Other sequelae of cerebral infarction: Secondary | ICD-10-CM | POA: Diagnosis not present

## 2022-09-08 DIAGNOSIS — F01511 Vascular dementia, unspecified severity, with agitation: Secondary | ICD-10-CM | POA: Diagnosis not present
